# Patient Record
Sex: Female | Born: 1950
Health system: Southern US, Community
[De-identification: ages and names within clinical notes are randomized; demographics above are authoritative.]

## PROBLEM LIST (undated history)

## (undated) DIAGNOSIS — F411 Generalized anxiety disorder: Secondary | ICD-10-CM

## (undated) DIAGNOSIS — K859 Acute pancreatitis without necrosis or infection, unspecified: Secondary | ICD-10-CM

## (undated) DIAGNOSIS — H269 Unspecified cataract: Secondary | ICD-10-CM

## (undated) DIAGNOSIS — K219 Gastro-esophageal reflux disease without esophagitis: Secondary | ICD-10-CM

## (undated) DIAGNOSIS — M1990 Unspecified osteoarthritis, unspecified site: Secondary | ICD-10-CM

## (undated) DIAGNOSIS — N189 Chronic kidney disease, unspecified: Secondary | ICD-10-CM

## (undated) DIAGNOSIS — E039 Hypothyroidism, unspecified: Secondary | ICD-10-CM

## (undated) HISTORY — PX: CHOLECYSTECTOMY: SHX55

## (undated) HISTORY — DX: Generalized anxiety disorder: F41.1

## (undated) HISTORY — PX: UPPER GASTROINTESTINAL ENDOSCOPY: SHX188

## (undated) HISTORY — DX: Unspecified cataract: H26.9

## (undated) HISTORY — DX: Hypothyroidism, unspecified: E03.9

## (undated) HISTORY — PX: WISDOM TOOTH EXTRACTION: SHX21

## (undated) HISTORY — PX: OTHER SURGICAL HISTORY: SHX169

## (undated) HISTORY — PX: GASTRIC BYPASS: SHX52

## (undated) HISTORY — DX: Acute pancreatitis without necrosis or infection, unspecified: K85.90

## (undated) HISTORY — DX: Gastro-esophageal reflux disease without esophagitis: K21.9

## (undated) HISTORY — DX: Unspecified osteoarthritis, unspecified site: M19.90

---

## 2000-02-06 ENCOUNTER — Emergency Department (HOSPITAL_COMMUNITY): Admission: EM | Admit: 2000-02-06 | Discharge: 2000-02-06 | Payer: Self-pay | Admitting: Emergency Medicine

## 2000-02-06 ENCOUNTER — Encounter: Payer: Self-pay | Admitting: Emergency Medicine

## 2002-06-27 ENCOUNTER — Ambulatory Visit: Admission: RE | Admit: 2002-06-27 | Discharge: 2002-06-27 | Payer: Self-pay | Admitting: Family Medicine

## 2002-06-30 ENCOUNTER — Ambulatory Visit (HOSPITAL_COMMUNITY): Admission: RE | Admit: 2002-06-30 | Discharge: 2002-06-30 | Payer: Self-pay | Admitting: Family Medicine

## 2002-06-30 ENCOUNTER — Encounter: Payer: Self-pay | Admitting: Family Medicine

## 2003-03-18 ENCOUNTER — Ambulatory Visit (HOSPITAL_BASED_OUTPATIENT_CLINIC_OR_DEPARTMENT_OTHER): Admission: RE | Admit: 2003-03-18 | Discharge: 2003-03-18 | Payer: Self-pay | Admitting: Obstetrics and Gynecology

## 2003-03-18 ENCOUNTER — Encounter (INDEPENDENT_AMBULATORY_CARE_PROVIDER_SITE_OTHER): Payer: Self-pay | Admitting: Specialist

## 2003-03-18 ENCOUNTER — Ambulatory Visit (HOSPITAL_COMMUNITY): Admission: RE | Admit: 2003-03-18 | Discharge: 2003-03-18 | Payer: Self-pay | Admitting: Obstetrics and Gynecology

## 2004-02-22 ENCOUNTER — Ambulatory Visit: Payer: Self-pay | Admitting: Family Medicine

## 2004-02-29 ENCOUNTER — Ambulatory Visit: Payer: Self-pay | Admitting: Family Medicine

## 2004-03-10 ENCOUNTER — Ambulatory Visit: Payer: Self-pay | Admitting: Internal Medicine

## 2004-03-17 ENCOUNTER — Ambulatory Visit: Payer: Self-pay | Admitting: Internal Medicine

## 2004-04-18 ENCOUNTER — Ambulatory Visit: Payer: Self-pay | Admitting: Internal Medicine

## 2004-05-04 ENCOUNTER — Ambulatory Visit: Payer: Self-pay | Admitting: Family Medicine

## 2004-05-05 ENCOUNTER — Ambulatory Visit: Payer: Self-pay | Admitting: Internal Medicine

## 2004-05-10 ENCOUNTER — Ambulatory Visit: Payer: Self-pay | Admitting: Family Medicine

## 2004-06-06 ENCOUNTER — Ambulatory Visit: Payer: Self-pay | Admitting: Family Medicine

## 2004-10-12 ENCOUNTER — Ambulatory Visit: Payer: Self-pay | Admitting: Family Medicine

## 2004-10-13 ENCOUNTER — Ambulatory Visit: Payer: Self-pay | Admitting: Family Medicine

## 2004-10-17 ENCOUNTER — Ambulatory Visit: Payer: Self-pay | Admitting: Internal Medicine

## 2004-10-18 ENCOUNTER — Ambulatory Visit: Payer: Self-pay | Admitting: Internal Medicine

## 2004-10-18 ENCOUNTER — Encounter (INDEPENDENT_AMBULATORY_CARE_PROVIDER_SITE_OTHER): Payer: Self-pay | Admitting: Specialist

## 2004-10-26 ENCOUNTER — Ambulatory Visit (HOSPITAL_COMMUNITY): Admission: RE | Admit: 2004-10-26 | Discharge: 2004-10-26 | Payer: Self-pay | Admitting: Plastic Surgery

## 2004-10-26 ENCOUNTER — Encounter (INDEPENDENT_AMBULATORY_CARE_PROVIDER_SITE_OTHER): Payer: Self-pay | Admitting: *Deleted

## 2004-10-26 ENCOUNTER — Ambulatory Visit (HOSPITAL_BASED_OUTPATIENT_CLINIC_OR_DEPARTMENT_OTHER): Admission: RE | Admit: 2004-10-26 | Discharge: 2004-10-26 | Payer: Self-pay | Admitting: Plastic Surgery

## 2006-04-09 ENCOUNTER — Ambulatory Visit: Payer: Self-pay | Admitting: Family Medicine

## 2006-04-09 LAB — CONVERTED CEMR LAB
ALT: 16 units/L (ref 0–40)
AST: 21 units/L (ref 0–37)
Albumin: 3.9 g/dL (ref 3.5–5.2)
Alkaline Phosphatase: 49 units/L (ref 39–117)
BUN: 13 mg/dL (ref 6–23)
Basophils Absolute: 0 10*3/uL (ref 0.0–0.1)
Basophils Relative: 0.4 % (ref 0.0–1.0)
CO2: 28 meq/L (ref 19–32)
Calcium: 9.1 mg/dL (ref 8.4–10.5)
Chloride: 110 meq/L (ref 96–112)
Cholesterol: 109 mg/dL (ref 0–200)
Creatinine, Ser: 0.9 mg/dL (ref 0.4–1.2)
Eosinophils Absolute: 0.1 10*3/uL (ref 0.0–0.6)
Eosinophils Relative: 2.1 % (ref 0.0–5.0)
GFR calc Af Amer: 84 mL/min
GFR calc non Af Amer: 69 mL/min
Glucose, Bld: 91 mg/dL (ref 70–99)
HCT: 31.7 % — ABNORMAL LOW (ref 36.0–46.0)
HDL: 48.7 mg/dL (ref >39.0)
Hemoglobin: 10.4 g/dL — ABNORMAL LOW (ref 12.0–15.0)
LDL Cholesterol: 51 mg/dL (ref 0–99)
Lymphocytes Relative: 38.1 % (ref 12.0–46.0)
MCHC: 32.8 g/dL (ref 30.0–36.0)
MCV: 71.6 fL — ABNORMAL LOW (ref 78.0–100.0)
Monocytes Absolute: 0.5 10*3/uL (ref 0.2–0.7)
Monocytes Relative: 9.6 % (ref 3.0–11.0)
Neutro Abs: 2.3 10*3/uL (ref 1.4–7.7)
Neutrophils Relative %: 49.8 % (ref 43.0–77.0)
Platelets: 282 10*3/uL (ref 150–400)
Potassium: 4.1 meq/L (ref 3.5–5.1)
RBC: 4.43 M/uL (ref 3.87–5.11)
RDW: 17.9 % — ABNORMAL HIGH (ref 11.5–14.6)
Sodium: 145 meq/L (ref 135–145)
TSH: 5.92 microintl units/mL — ABNORMAL HIGH (ref 0.35–5.50)
Total Bilirubin: 0.3 mg/dL (ref 0.3–1.2)
Total CHOL/HDL Ratio: 2.2
Total Protein: 6.8 g/dL (ref 6.0–8.3)
Triglycerides: 49 mg/dL (ref 0–149)
VLDL: 10 mg/dL (ref 0–40)
WBC: 4.7 10*3/uL (ref 4.5–10.5)

## 2006-04-19 ENCOUNTER — Ambulatory Visit: Payer: Self-pay | Admitting: Family Medicine

## 2006-04-24 ENCOUNTER — Ambulatory Visit: Payer: Self-pay | Admitting: Family Medicine

## 2006-11-10 ENCOUNTER — Ambulatory Visit: Payer: Self-pay | Admitting: Family Medicine

## 2006-12-17 ENCOUNTER — Ambulatory Visit: Payer: Self-pay | Admitting: Family Medicine

## 2007-02-08 DIAGNOSIS — M2550 Pain in unspecified joint: Secondary | ICD-10-CM | POA: Insufficient documentation

## 2007-03-08 ENCOUNTER — Ambulatory Visit: Payer: Self-pay | Admitting: Internal Medicine

## 2007-03-08 DIAGNOSIS — E039 Hypothyroidism, unspecified: Secondary | ICD-10-CM | POA: Insufficient documentation

## 2007-03-08 DIAGNOSIS — R5383 Other fatigue: Secondary | ICD-10-CM

## 2007-03-08 DIAGNOSIS — R5381 Other malaise: Secondary | ICD-10-CM | POA: Insufficient documentation

## 2007-03-08 HISTORY — DX: Hypothyroidism, unspecified: E03.9

## 2007-03-11 ENCOUNTER — Ambulatory Visit: Payer: Self-pay | Admitting: Family Medicine

## 2007-03-11 ENCOUNTER — Telehealth: Payer: Self-pay | Admitting: Family Medicine

## 2007-03-11 LAB — CONVERTED CEMR LAB
Anti Nuclear Antibody(ANA): NEGATIVE
Folate: >20 ng/mL
Iron: 112 ug/dL (ref 42–145)
Rheumatoid fact SerPl-aCnc: <20 intl units/mL — ABNORMAL LOW (ref 0.0–20.0)
Sed Rate: 15 mm/hr (ref 0–25)
Transferrin: 348.7 mg/dL (ref >=212.0)
Vitamin B-12: 585 pg/mL (ref 211–911)

## 2007-03-12 LAB — CONVERTED CEMR LAB
ALT: 22 units/L (ref 0–35)
AST: 23 units/L (ref 0–37)
Albumin: 4.2 g/dL (ref 3.5–5.2)
Alkaline Phosphatase: 57 units/L (ref 39–117)
BUN: 15 mg/dL (ref 6–23)
Basophils Absolute: 0 10*3/uL (ref 0.0–0.1)
Basophils Relative: 0 % (ref 0–1)
Bilirubin, Direct: 0.1 mg/dL (ref 0.0–0.3)
CO2: 25 meq/L (ref 19–32)
Calcium: 9 mg/dL (ref 8.4–10.5)
Chloride: 109 meq/L (ref 96–112)
Creatinine, Ser: 0.72 mg/dL (ref 0.40–1.20)
Eosinophils Absolute: 0.1 10*3/uL (ref 0.0–0.7)
Eosinophils Relative: 2 % (ref 0–5)
Glucose, Bld: 67 mg/dL — ABNORMAL LOW (ref 70–99)
HCT: 33.4 % — ABNORMAL LOW (ref 36.0–46.0)
Hemoglobin: 10.4 g/dL — ABNORMAL LOW (ref 12.0–15.0)
Indirect Bilirubin: 0.2 mg/dL (ref 0.0–0.9)
Lymphocytes Relative: 35 % (ref 12–46)
Lymphs Abs: 2.4 10*3/uL (ref 0.7–4.0)
MCHC: 31.1 g/dL (ref 30.0–36.0)
MCV: 78 fL (ref 78.0–100.0)
Monocytes Absolute: 0.6 10*3/uL (ref 0.1–1.0)
Monocytes Relative: 9 % (ref 3–12)
Neutro Abs: 3.7 10*3/uL (ref 1.7–7.7)
Neutrophils Relative %: 54 % (ref 43–77)
Platelets: 277 10*3/uL (ref 150–400)
Potassium: 4.3 meq/L (ref 3.5–5.3)
RBC: 4.28 M/uL (ref 3.87–5.11)
RDW: 17.6 % — ABNORMAL HIGH (ref 11.5–15.5)
Sodium: 145 meq/L (ref 135–145)
TSH: 3.188 microintl units/mL (ref 0.350–5.50)
Total Bilirubin: 0.3 mg/dL (ref 0.3–1.2)
Total Protein: 6.6 g/dL (ref 6.0–8.3)
WBC: 6.8 10*3/uL (ref 4.0–10.5)

## 2007-03-19 ENCOUNTER — Ambulatory Visit: Payer: Self-pay | Admitting: Family Medicine

## 2007-03-20 ENCOUNTER — Telehealth: Payer: Self-pay | Admitting: Family Medicine

## 2007-03-25 ENCOUNTER — Encounter: Payer: Self-pay | Admitting: Family Medicine

## 2007-04-30 ENCOUNTER — Telehealth: Payer: Self-pay | Admitting: Family Medicine

## 2007-09-20 ENCOUNTER — Ambulatory Visit: Payer: Self-pay | Admitting: Family Medicine

## 2007-09-20 DIAGNOSIS — F411 Generalized anxiety disorder: Secondary | ICD-10-CM | POA: Insufficient documentation

## 2007-09-20 HISTORY — DX: Generalized anxiety disorder: F41.1

## 2007-12-02 ENCOUNTER — Telehealth: Payer: Self-pay | Admitting: Family Medicine

## 2008-01-06 ENCOUNTER — Ambulatory Visit: Payer: Self-pay | Admitting: Family Medicine

## 2008-06-27 ENCOUNTER — Telehealth: Payer: Self-pay | Admitting: Family Medicine

## 2010-04-14 NOTE — Miscellaneous (Signed)
Summary: meds  Clinical Lists Changes  Medications: Added new medication of TANDEM F 162-115.2-1 MG  CAPS (FERROUS FUM-IRON POLYSACCH-FA) Take 1 capsule by mouth once a day - Signed Changed medication from COBAL-1000 1000 MCG/ML INJ SOLN (CYANOCOBALAMIN) to COBAL-1000 1000 MCG/ML INJ SOLN (CYANOCOBALAMIN) 1 inj. a month - Signed Rx of COBAL-1000 1000 MCG/ML INJ SOLN (CYANOCOBALAMIN) 1 inj. a month;  #1 bottle x 4;  Signed;  Entered by: Arcola Jansky, RN;  Authorized by: Roderick Pee MD;  Method used: Print then Give to Patient Rx of TANDEM F 162-115.2-1 MG  CAPS (FERROUS FUM-IRON POLYSACCH-FA) Take 1 capsule by mouth once a day;  #90 x 4;  Signed;  Entered by: Arcola Jansky, RN;  Authorized by: Roderick Pee MD;  Method used: Print then Give to Patient Rx of SYNTHROID 50 MCG TABS (LEVOTHYROXINE SODIUM) 1 by mouth qd;  #90 x 4;  Signed;  Entered by: Arcola Jansky, RN;  Authorized by: Roderick Pee MD;  Method used: Print then Give to Patient Rx of TANDEM F 162-115.2-1 MG  CAPS (FERROUS FUM-IRON POLYSACCH-FA) Take 1 capsule by mouth once a day;  #30 x 0;  Signed;  Entered by: Arcola Jansky, RN;  Authorized by: Roderick Pee MD;  Method used: Print then Give to Patient    Prescriptions: TANDEM F 162-115.2-1 MG  CAPS (FERROUS FUM-IRON POLYSACCH-FA) Take 1 capsule by mouth once a day  #30 x 0   Entered by:   Arcola Jansky, RN   Authorized by:   Roderick Pee MD   Signed by:   Arcola Jansky, RN on 03/25/2007   Method used:   Print then Give to Patient   RxID:   [id number] SYNTHROID 50 MCG TABS (LEVOTHYROXINE SODIUM) 1 by mouth qd  #90 x 4   Entered by:   Arcola Jansky, RN   Authorized by:   Roderick Pee MD   Signed by:   Arcola Jansky, RN on 03/25/2007   Method used:   Print then Give to Patient   RxID:   [id number] TANDEM F 162-115.2-1 MG  CAPS (FERROUS FUM-IRON POLYSACCH-FA) Take 1 capsule by mouth once a day   #90 x 4   Entered by:   Arcola Jansky, RN   Authorized by:   Roderick Pee MD   Signed by:   Arcola Jansky, RN on 03/25/2007   Method used:   Print then Give to Patient   RxID:   [id number] COBAL-1000 1000 MCG/ML INJ SOLN (CYANOCOBALAMIN) 1 inj. a month  #1 bottle x 4   Entered by:   Arcola Jansky, RN   Authorized by:   Roderick Pee MD   Signed by:   Arcola Jansky, RN on 03/25/2007   Method used:   Print then Give to Patient   RxID:   [id number]

## 2010-04-14 NOTE — Assessment & Plan Note (Signed)
Summary: ACUTE/VN    History of Present Illness: Jaime Fuller is a 60 year old female, who comes in today for evaluation of symmetrical joint pain since November 28.  She remembers a distinctly because the Thursday prior was Thanksgiving.  She felt fine.  The next day, the 20 H. began having aching in her right wrist.  Over 3 or 4 days that got worse in the pelvis on all her joints hurt.  She has no redness or swelling, but the dentist saw her.  She's otherwise been well.  Review of systems negative except she got a tick on her neck in August of 08.  She said it was small and should describes it as a deer tick.  She said it left him not to two months to go away.  She said no atypical skin rashes otherwise been well.  She saw Dr. Cato Mulligan for this problem on December 29.  At that time.  Hemoglobin was 10, otherwise, laboratory data was normal.  Acute Visit History:      She denies abdominal pain, chest pain, constipation, cough, diarrhea, earache, eye symptoms, fever, genitourinary symptoms, headache, musculoskeletal symptoms, nasal discharge, nausea, rash, sinus problems, sore throat, and vomiting.         Current Allergies: No known allergies   Past Medical History:    Reviewed history from 03/08/2007 and no changes required:       Hypothyroidism    Risk Factors:  Tobacco use:  never   Review of Systems      See HPI   Physical Exam  General:     Well-developed,well-nourished,in no acute distress; alert,appropriate and cooperative throughout examination Head:     Normocephalic and atraumatic without obvious abnormalities. No apparent alopecia or balding. Eyes:     No corneal or conjunctival inflammation noted. EOMI. Perrla. Funduscopic exam benign, without hemorrhages, exudates or papilledema. Vision grossly normal. Ears:     External ear exam shows no significant lesions or deformities.  Otoscopic examination reveals clear canals, tympanic membranes are intact bilaterally without  bulging, retraction, inflammation or discharge. Hearing is grossly normal bilaterally. Nose:     External nasal examination shows no deformity or inflammation. Nasal mucosa are pink and moist without lesions or exudates. Mouth:     Oral mucosa and oropharynx without lesions or exudates.  Teeth in good repair. Msk:     the right wrist is tender to palpation is not red or hot.  Other joints are tender, but not red or hot Pulses:     R and L carotid,radial,femoral,dorsalis pedis and posterior tibial pulses are full and equal bilaterally Extremities:     No clubbing, cyanosis, edema, or deformity noted with normal full range of motion of all joints.      Impression & Recommendations:  Problem # 1:  PAIN IN JOINT, SITE UNSPECIFIED (ICD-719.40) Assessment: New  Orders: Venipuncture (19147) TLB-Rheumatoid Factor (RA) (82956-OZ) TLB-Sedimentation Rate (ESR) (85651-ESR) T-Antinuclear Antib (ANA) [phone]) T-Lyme Disease (62952-84132) TLB-B12 + Folate Pnl (82746_82607-B12/FOL) TLB-Iron, (Fe) Total (83540-FE) TLB-Transferrin (84466-TRNSF)   Complete Medication List: 1)  Synthroid 50 Mcg Tabs (Levothyroxine sodium) .Marland Kitchen.. 1 by mouth qd 2)  Ferrous Fumarate 325 Mg Tabs (Ferrous fumarate) .Marland Kitchen.. 1 by mouth once daily 3)  Prednisone 20 Mg Tabs (Prednisone) .... Uad   Patient Instructions: 1)  begin prednisone 20-mg tablets two a day for 3 days, one a day for 3 days, a half a tablet a day for 3 days, then half a tablet every other day for two  week taper.  We will get special laboratory data today, he return next Tuesday for follow-up    Prescriptions: PREDNISONE 20 MG  TABS (PREDNISONE) UAD  #50 x 1   Entered and Authorized by:   Roderick Pee MD   Signed by:   Roderick Pee MD on 03/11/2007   Method used:   Print then Give to Patient   RxID:   [id number]  ]

## 2010-04-14 NOTE — Assessment & Plan Note (Signed)
Summary: Orders Update  Nurse Visit   Impression & Recommendations: lot [id number], EXP 30 jun 09, sanofi pasteur left deltoid IM, 0.5 cc.        Influenza Vaccine    Vaccine Type: Fluvax 3+    Given by: Arcola Jansky, RN  Flu Vaccine Consent Questions    Do you have a history of severe allergic reactions to this vaccine? no    Any prior history of allergic reactions to egg and/or gelatin? no    Do you have a sensitivity to the preservative Thimersol? no    Do you have a past history of Guillan-Barre Syndrome? no    Do you currently have an acute febrile illness? no    Have you ever had a severe reaction to latex? no    Vaccine information given and explained to patient? yes    Are you currently pregnant? no   Orders Added: 1)  Flu Vaccine 37yrs + [90658] 2)  Admin 1st Vaccine [email or web address].Peer    ]

## 2010-04-14 NOTE — Progress Notes (Signed)
Summary: clonzepam refill  Phone Note From Pharmacy   Caller: [facility] [address] Details for Reason: refill Summary of Call: pt would like a refill of clonazepam is this okay to fill? Initial call taken by: Kern Reap CMA,  December 02, 2007 9:53 AM  Follow-up for Phone Call        ok #30 one at bedtime as needed refills x 5 Follow-up by: Roderick Pee MD,  December 02, 2007 10:28 AM  Additional Follow-up for Phone Call Additional follow up Details #1::        rx called in Additional Follow-up by: Kern Reap CMA,  December 02, 2007 4:03 PM

## 2010-04-14 NOTE — Assessment & Plan Note (Signed)
Summary: flu shot - rv  Nurse Visit  Flu Vaccine Consent Questions     Do you have a history of severe allergic reactions to this vaccine? no    Any prior history of allergic reactions to egg and/or gelatin? no    Do you have a sensitivity to the preservative Thimersol? no    Do you have a past history of Guillan-Barre Syndrome? no    Do you currently have an acute febrile illness? no    Have you ever had a severe reaction to latex? no    Vaccine information given and explained to patient? yes    Are you currently pregnant? no    Lot Number:[id number]   Exp Date:09/09/2008   Site Given  Left Deltoid IM   Prior Medications: SYNTHROID 50 MCG TABS (LEVOTHYROXINE SODIUM) 1 by mouth qd VITAMIN B-12 CR 1000 MCG  TBCR (CYANOCOBALAMIN)  COBAL-1000 1000 MCG/ML INJ SOLN (CYANOCOBALAMIN) 1 inj. a month TANDEM F 162-115.2-1 MG  CAPS (FERROUS FUM-IRON POLYSACCH-FA) Take 1 capsule by mouth once a day KLONOPIN 0.5 MG  TABS (CLONAZEPAM) Take 1 tablet by mouth two times a day Current Allergies: No known allergies     Orders Added: 1)  Admin 1st Vaccine [90471] 2)  Flu Vaccine 91yrs + B[place].Dew    ]

## 2010-04-14 NOTE — Progress Notes (Signed)
Summary: ON-CALL PHONE NOTE  Phone Note Call from Patient Call back at [phone]   Summary of Call: UNABLE TO URINATE(PATIENT SAID ONLY A LITTLE COMES OUT BUT HER BLADDER FEELS FULL)-SECOND TIME THIS HAPPENED THIS WEEK: PATIENT IS CURRENTLY TAKING PRESCRIBED MED:  NITROSURANTOIN-MACRO (PER GYN). PATIENT SEEN GYN 3 WEEKS AGO FOR THIS SAME CONCERN. PATIENT THEN STATES THE MAIN REASON SHE CALLED IS SHE IS BLEEDING ALSO WHEN SHE TRIES TO URINATE-PATIENT NO LONGER WITH MENSTURAL. PATIENT INSTRUCTED TO BE SEEN AT URGENT CARE OR EMERGENCY ROOM. PATIENT MAY NEED TESTING WHICH WE DO NOT HAVE ACCESS TO AT [facility]. PATIENT AWARE I WILL FORWARD MESSAGE TO ON-CALL DR. AND IF ANY FUTHER INSTRUCTION TO BE GIVEN SHE WILL RECEIVE CALL BACK. Initial call taken by: Shonna Chock,  June 27, 2008 9:36 AM  Follow-up for Phone Call        Agree. Follow-up by: Shaune Leeks MD,  June 27, 2008 9:40 AM

## 2010-04-14 NOTE — Progress Notes (Signed)
Summary: blood work results-more pain  Phone Note Call from Patient Call back at [employer] [phone] Call back at cell [phone]   Caller: patient triage message Call For: todd Summary of Call: has more pain needs to know what the blood work showed Dr Cato Mulligan said he would fax the results to Dr Linnell Fulling and does not want blood work done there Initial call taken by: Roselle Locus,  March 11, 2007 8:58 AM

## 2010-04-14 NOTE — Progress Notes (Signed)
Summary: low vit D  Phone Note Call from Patient Call back at [phone] or c [phone]   Call For: todd Summary of Call: Dr. Orlan Leavens did Vit D test that was low & is faxing copy to you for you to address/treat.  [facility]  [facility]. Initial call taken by: Rudy Jew, RN,  April 30, 2007 1:42 PM  Follow-up for Phone Call        vitamin D level 27.  Patient takes vitamin D.  No change. Follow-up by: Roderick Pee MD,  May 13, 2007 10:35 AM

## 2010-04-14 NOTE — Assessment & Plan Note (Signed)
Summary: 1 week roa/jls   Vital Signs:  Patient Profile:   60 Years Old Female Weight:      177 pounds (80.45 kg) Temp:     98.4 degrees F (36.89 degrees C) oral Pulse rate:   66 / minute BP sitting:   108 / 55  (right arm)  Pt. in pain?   yes    Location:   ARM/PAIN    Intensity:   8    Type:       burning/STINGING/COLD  Vitals Entered By: Arcola Jansky, RN (March 19, 2007 1:52 PM)                  Chief Complaint:  "STILL HAVING ARM/WRIST PAIN".  History of Present Illness: pam is a 60 year old female, who comes back today for evaluation probably arthralgias.  She was initially seen by Dr. Hermelinda Medicus for evaluation.  Symmetrical joint pain.  His exam and basic laboratory data was negative.  She came back leak later and was not any better.  Re-examination showed no change in her physical examination.  She had joint pain, without any redness, swelling, etc.  Further diagnostic studies, including Lyme titers rheumatoid factor, sed rate, etc., were negative.  She was started on prednisone is now down to half a tablet a day and feels better, but not well.  Current Allergies (reviewed today): No known allergies   Past Medical History:    Reviewed history from 03/08/2007 and no changes required:       Hypothyroidism       gastric bypass surgery       cholecystectomy       childbirth x 2       abdominal plasty, pancreatitis, secondary to ERCP       wisdom teeth, extraction       carpal tunnel surgery left hand   Family History:    Reviewed history and no changes required:       father died at 69 MI, lung cancer.  Mother has a history of B12 deficiency, brother in good health.  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   Risk  Factors:  Tobacco use:  never Drug use:  no Alcohol use:  no Exercise:  yes   Review of Systems      See HPI   Physical Exam  General:     Well-developed,well-nourished,in no acute distress; alert,appropriate and cooperative throughout examination Head:     Normocephalic and atraumatic without obvious abnormalities. No apparent alopecia or balding. Eyes:     No corneal or conjunctival inflammation noted. EOMI. Perrla. Funduscopic exam benign, without hemorrhages, exudates or papilledema. Vision grossly normal. Ears:     External ear exam shows no significant lesions or deformities.  Otoscopic examination reveals clear canals, tympanic membranes are intact bilaterally without bulging, retraction, inflammation or discharge. Hearing is grossly normal bilaterally. Nose:     External nasal examination shows no deformity or inflammation. Nasal mucosa are pink and moist without lesions or exudates. Mouth:     Oral mucosa and oropharynx without lesions or exudates.  Teeth in good repair. Msk:  No deformity or scoliosis noted of thoracic or lumbar spine.  there is tenderness in the joints, especially the right wrist.  There is no redness or swelling. Pulses:     R and L carotid,radial,femoral,dorsalis pedis and posterior tibial pulses are full and equal bilaterally Extremities:     No clubbing, cyanosis, edema, or deformity noted with normal full range of motion of all joints.   Neurologic:     No cranial nerve deficits noted. Station and gait are normal. Plantar reflexes are down-going bilaterally. DTRs are symmetrical throughout. Sensory, motor and coordinative functions appear intact.    Impression & Recommendations:  Problem # 1:  PAIN IN JOINT, SITE UNSPECIFIED (ICD-719.40) Assessment: Improved  Complete Medication List: 1)  Synthroid 50 Mcg Tabs (Levothyroxine sodium) .Marland Kitchen.. 1 by mouth qd 2)  Ferrous Fumarate 325 Mg Tabs (Ferrous fumarate) .Marland Kitchen.. 1 by mouth once daily 3)   Prednisone 20 Mg Tabs (Prednisone) .... Uad   Patient Instructions: 1)  increase the prednisone to two a day, anterior well, then begin a slow tapering process.  Return p.r.n.    ]

## 2010-04-14 NOTE — Assessment & Plan Note (Signed)
Summary: extreme anxiety over daughter's arrest/dm   Vital Signs:  Patient Profile:   60 Years Old Female Weight:      177 pounds Temp:     98.1 degrees F oral BP sitting:   130 / 90  (left arm) Cuff size:   regular  Vitals Entered By: Kern Reap CMA (September 20, 2007 8:48 AM)                 Chief Complaint:  anxiety and stress.  History of Present Illness: Jaime Fuller is a 60 year old female, who comes in today accompanied by her husband for evaluation of anxiety and crying.  Her daughter currently is supposed to not be in contact with a person that she had been involved with a theft.  However, she was in was rearrested by the police and is currently in jail.  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    Review of Systems      See HPI   Physical Exam  General:     Well-developed,well-nourished,in no acute distress; alert,appropriate and cooperative throughout examination Psych:     tearful and slightly anxious.      Impression & Recommendations:  Problem # 1:  ANXIETY STATE, UNSPECIFIED (ICD-300.00) Assessment: New  Her updated medication list for this problem includes:    Klonopin 0.5 Mg Tabs (Clonazepam) .Marland Kitchen... Take 1 tablet by mouth two times a day   Complete Medication List: 1)  Synthroid 50 Mcg Tabs (Levothyroxine sodium) .Marland Kitchen.. 1 by mouth qd 2)  Vitamin B-12 Cr 1000 Mcg Tbcr (Cyanocobalamin) 3)  Cobal-1000 1000 Mcg/ml Inj Soln (Cyanocobalamin) .Marland Kitchen.. 1 inj. a month 4)  Tandem F 162-115.2-1 Mg Caps (Ferrous fum-iron polysacch-fa) .... Take 1 capsule by mouth once a day 5)  Klonopin 0.5 Mg Tabs (Clonazepam) .... Take 1  tablet by mouth two times a day   Patient Instructions: 1)  you may take Klonopin .5 mg twice a day as needed.  Return with her daughter on Monday morning at 8 a.m. for further evaluation if over the weekend, you feel she is severely depressed and is in need of help.  Prior to Monday, you may go and take her to [facility]   Prescriptions: KLONOPIN 0.5 MG  TABS (CLONAZEPAM) Take 1 tablet by mouth two times a day  #30 x 0   Entered and Authorized by:   Roderick Pee MD   Signed by:   Roderick Pee MD on 09/20/2007   Method used:   Print then Give to Patient   RxID:   [id number]  ]

## 2010-04-14 NOTE — Assessment & Plan Note (Signed)
Summary: fatigue/pt was bitten by tick over 3 month ago/njr   Vital Signs:  Patient Profile:   60 Years Old Female Weight:      181 pounds Temp:     98.1 degrees F oral Pulse rate:   76 / minute Pulse rhythm:   regular BP sitting:   132 / 76  (left arm) Cuff size:   regular  Vitals Entered By: Alfred Levins, CMA (March 08, 2007 3:48 PM)                 Chief Complaint:  fatigue and joints hurt.  History of Present Illness: Bitten by ticke 8/08---no local sxs other than a small knot on the back of head. She is now complianing of joint pains---"all of the time"--for one month. Right wrist is worse than any other joint. She denies any rash or any other complaints. Joint involved---wrist, fingers, knees, ankles.   She has a hx of gastric bypass 2004 takes iron and B12 injection.   Current Allergies: No known allergies   Past Medical History:    Hypothyroidism  Past Surgical History:    Gastric bypass    Abdomplasty     Review of Systems       no other complaints in a complete ROS    Physical Exam  General:     Well-developed,well-nourished,in no acute distress; alert,appropriate and cooperative throughout examination Head:     normocephalic and atraumatic.   Eyes:     pupils equal and pupils round.   Ears:     R ear normal and L ear normal.   Neck:     No deformities, masses, or tenderness noted. Lungs:     Normal respiratory effort, chest expands symmetrically. Lungs are clear to auscultation, no crackles or wheezes. Heart:     Normal rate and regular rhythm. S1 and S2 normal without gallop, murmur, click, rub or other extra sounds. Abdomen:     Bowel sounds positive,abdomen soft and non-tender without masses, organomegaly or hernias noted. Msk:     No deformity or scoliosis noted of thoracic or lumbar spine.   Extremities:     No clubbing, cyanosis, edema, or deformity noted with normal full range of motion of all joints---no effusions Skin:  Intact without suspicious lesions or rashes Psych:     Cognition and judgment appear intact. Alert and cooperative with normal attention span and concentration. No apparent delusions, illusions, hallucinations    Impression & Recommendations:  Problem # 1:  FATIGUE (ICD-780.79) also some associated joint pains needs further evaluation.  she has long standing anemia.  Orders: Venipuncture (84132) TLB-BMP (Basic Metabolic Panel-BMET) (80048-METABOL) TLB-Hepatic/Liver Function Pnl (80076-HEPATIC) TLB-CBC Platelet - w/Differential (85025-CBCD) Sedimentation Rate, non-automated (44010)   Problem # 2:  HYPOTHYROIDISM (ICD-244.9) no sxs-needs re check Her updated medication list for this problem includes:    Synthroid 50 Mcg Tabs (Levothyroxine sodium) .Marland Kitchen... 1 by mouth qd  Labs Reviewed: TSH: 5.92 (04/09/2006)    Chol: 109 (04/09/2006)   HDL: 48.7 (04/09/2006)   LDL: 51 (04/09/2006)   TG: 49 (04/09/2006)  Orders: TLB-TSH (Thyroid Stimulating Hormone) (84443-TSH)   Complete Medication List: 1)  Synthroid 50 Mcg Tabs (Levothyroxine sodium) .Marland Kitchen.. 1 by mouth qd 2)  Ferrous Fumarate 325 Mg Tabs (Ferrous fumarate) .Marland Kitchen.. 1 by mouth once daily     ]

## 2010-04-14 NOTE — Progress Notes (Signed)
Summary: saturday clinic  saturday clinic   Imported By: Kassie Mends 12/06/2006 13:06:58  _____________________________________________________________________  External Attachment:    Type:   Image     Comment:   saturday clinic

## 2010-04-14 NOTE — Assessment & Plan Note (Signed)
Summary: FLU SHOTS//[facility]  Nurse Visit          ]

## 2010-04-14 NOTE — Progress Notes (Signed)
Summary: written rx's  Call back at Home Phone [phone]   Caller: patient triage message Call For: Tawanna Cooler Summary of Call: Dr Lolita Cram gave her some samples and she would like an rx for a 90 day supply of  tanden F and synthroid Vitamin B12 give her a call and advise when she can pick  up the rx's.    Initial call taken by: Roselle Locus,  March 20, 2007 4:10 PM    New/Updated Medications: VITAMIN B-12 CR 1000 MCG  TBCR (CYANOCOBALAMIN)  COBAL-1000 1000 MCG/ML INJ SOLN (CYANOCOBALAMIN)

## 2010-06-06 ENCOUNTER — Telehealth: Payer: Self-pay | Admitting: Family Medicine

## 2010-06-06 MED ORDER — LEVOTHYROXINE SODIUM 50 MCG PO TABS: 50.0000 ug | ORAL_TABLET | Freq: Every day | ORAL | Status: DC

## 2010-06-06 NOTE — Telephone Encounter (Signed)
Okay for labs and office visit.  TSH, T3, T4

## 2010-06-06 NOTE — Telephone Encounter (Signed)
Pt called and said that she is out of Synthroid .05mg . Pt has been off of med for 2 weeks and is very achey. Pt req this to be [facility] to [facility] [address]  Pt also wanted to sch a med check ov, but would like to get lab work done prior to visit to test thyroid. Pls advise.

## 2010-06-09 NOTE — Telephone Encounter (Signed)
Lft vm for pt to cb to sch ov and labs prior, as noted. Waiting on call back.

## 2010-06-22 NOTE — Telephone Encounter (Signed)
Lft another vm for pt to sch ov and labs prior. Waiting on call back.

## 2010-06-30 ENCOUNTER — Other Ambulatory Visit (INDEPENDENT_AMBULATORY_CARE_PROVIDER_SITE_OTHER): Payer: BC Managed Care – PPO | Admitting: Family Medicine

## 2010-06-30 DIAGNOSIS — E039 Hypothyroidism, unspecified: Secondary | ICD-10-CM

## 2010-06-30 LAB — T4, FREE: Free T4: 0.87 ng/dL (ref 0.60–1.60)

## 2010-06-30 LAB — TSH: TSH: 2.44 u[IU]/mL (ref 0.35–5.50)

## 2010-06-30 LAB — T3, FREE: T3, Free: 2.9 pg/mL (ref 2.3–4.2)

## 2010-07-06 ENCOUNTER — Encounter: Payer: Self-pay | Admitting: Family Medicine

## 2010-07-07 ENCOUNTER — Encounter: Payer: Self-pay | Admitting: Family Medicine

## 2010-07-07 ENCOUNTER — Ambulatory Visit (INDEPENDENT_AMBULATORY_CARE_PROVIDER_SITE_OTHER): Payer: BC Managed Care – PPO | Admitting: Family Medicine

## 2010-07-07 DIAGNOSIS — R5383 Other fatigue: Secondary | ICD-10-CM

## 2010-07-07 DIAGNOSIS — E039 Hypothyroidism, unspecified: Secondary | ICD-10-CM

## 2010-07-07 DIAGNOSIS — Z23 Encounter for immunization: Secondary | ICD-10-CM

## 2010-07-07 DIAGNOSIS — M2550 Pain in unspecified joint: Secondary | ICD-10-CM

## 2010-07-07 DIAGNOSIS — D510 Vitamin B12 deficiency anemia due to intrinsic factor deficiency: Secondary | ICD-10-CM

## 2010-07-07 DIAGNOSIS — M549 Dorsalgia, unspecified: Secondary | ICD-10-CM

## 2010-07-07 DIAGNOSIS — G8929 Other chronic pain: Secondary | ICD-10-CM

## 2010-07-07 DIAGNOSIS — R5381 Other malaise: Secondary | ICD-10-CM

## 2010-07-07 LAB — CBC WITH DIFFERENTIAL/PLATELET
Basophils Absolute: 0 10*3/uL (ref 0.0–0.1)
Basophils Relative: 0.5 % (ref 0.0–3.0)
Eosinophils Absolute: 0.1 10*3/uL (ref 0.0–0.7)
Eosinophils Relative: 1.3 % (ref 0.0–5.0)
HCT: 36.6 % (ref 36.0–46.0)
Hemoglobin: 12.4 g/dL (ref 12.0–15.0)
Lymphocytes Relative: 33.8 % (ref 12.0–46.0)
Lymphs Abs: 2.2 10*3/uL (ref 0.7–4.0)
MCHC: 33.9 g/dL (ref 30.0–36.0)
MCV: 86.7 fl (ref 78.0–100.0)
Monocytes Absolute: 0.5 10*3/uL (ref 0.1–1.0)
Monocytes Relative: 7.9 % (ref 3.0–12.0)
Neutro Abs: 3.6 10*3/uL (ref 1.4–7.7)
Neutrophils Relative %: 56.5 % (ref 43.0–77.0)
Platelets: 243 10*3/uL (ref 150.0–400.0)
RBC: 4.22 Mil/uL (ref 3.87–5.11)
RDW: 13.8 % (ref 11.5–14.6)
WBC: 6.4 10*3/uL (ref 4.5–10.5)

## 2010-07-07 LAB — BASIC METABOLIC PANEL
BUN: 17 mg/dL (ref 6–23)
CO2: 30 mEq/L (ref 19–32)
Calcium: 9.1 mg/dL (ref 8.4–10.5)
Chloride: 103 mEq/L (ref 96–112)
Creatinine, Ser: 0.8 mg/dL (ref 0.4–1.2)
GFR: 84.17 mL/min (ref >60.00)
Glucose, Bld: 75 mg/dL (ref 70–99)
Potassium: 4.2 mEq/L (ref 3.5–5.1)
Sodium: 140 mEq/L (ref 135–145)

## 2010-07-07 LAB — POCT URINALYSIS DIPSTICK
Bilirubin, UA: NEGATIVE
Blood, UA: NEGATIVE
Glucose, UA: NEGATIVE
Ketones, UA: NEGATIVE
Leukocytes, UA: NEGATIVE
Nitrite, UA: NEGATIVE
Protein, UA: NEGATIVE
Spec Grav, UA: 1.015
Urobilinogen, UA: 0.2
pH, UA: 7

## 2010-07-07 LAB — LIPID PANEL
Cholesterol: 146 mg/dL (ref 0–200)
HDL: 59.9 mg/dL (ref >39.00)
LDL Cholesterol: 74 mg/dL (ref 0–99)
Total CHOL/HDL Ratio: 2
Triglycerides: 60 mg/dL (ref 0.0–149.0)
VLDL: 12 mg/dL (ref 0.0–40.0)

## 2010-07-07 LAB — VITAMIN B12: Vitamin B-12: 255 pg/mL (ref 211–911)

## 2010-07-07 LAB — HEPATIC FUNCTION PANEL
ALT: 19 U/L (ref 0–35)
AST: 23 U/L (ref 0–37)
Albumin: 3.7 g/dL (ref 3.5–5.2)
Alkaline Phosphatase: 54 U/L (ref 39–117)
Bilirubin, Direct: 0.1 mg/dL (ref 0.0–0.3)
Total Bilirubin: 0.5 mg/dL (ref 0.3–1.2)
Total Protein: 6.3 g/dL (ref 6.0–8.3)

## 2010-07-07 MED ORDER — CYANOCOBALAMIN 1000 MCG/ML IJ SOLN: 1000.0000 ug | INTRAMUSCULAR | Status: DC

## 2010-07-07 MED ORDER — LEVOTHYROXINE SODIUM 50 MCG PO TABS: 50.0000 ug | ORAL_TABLET | Freq: Every day | ORAL | Status: DC

## 2010-07-07 MED ORDER — CLONAZEPAM 0.5 MG PO TABS: 0.5000 mg | ORAL_TABLET | Freq: Two times a day (BID) | ORAL | Status: DC

## 2010-07-07 NOTE — Patient Instructions (Signed)
Begin Motrin 400 mg twice daily with food.  Increase monthly and to the region maximum dose of 800 mg twice daily.  Walk daily.  Continue the B12 monthly.  Continue your thyroid daily.  We will put in a consult request for you to see Dr. Danielle Dess.  Follow-up in one year or sooner if any problems

## 2010-07-07 NOTE — Progress Notes (Signed)
  Subjective:    Patient ID: Jaime Fuller, female    DOB: [birth year], 60 y.o.   MRN: [id number]  HPIam is a 60 y/o Married female, nonsmoker, who comes in today for general physical examination today, because of some diffuse symmetrical in joint pain x 2 years, and a history of hypothyroidism, on Synthroid, history of B12 deficiency, on injectable B121 cc monthly and a new problem of degenerative disease with severe lumbar back pain.  She takes vitamin B12 1 cc monthly check B12 level and CBC.  She takes Synthroid 50 mcg daily TSH level normal.  For the last two, years she's had a history of joint pain.  She describes it as diffuse soreness worse in the morning, better when she feels warm, and in the summer, and indeed, she goes to the beach is much as possible.  No history of any redness.  Question family history of osteoarthritis.  A couple years ago, and she began having back pain and she went to an orthopedist who did a complete workup.  She was told she had an L5 ruptured disk.  It was treated with epidural steroid injections x 3 and her pain improved.  However, in the last couple years.  It is getting worse.  She denies any neurologic symptoms.  There has been recently saw Dr. Danielle Dess, and she would like to see Dr. Danielle Dess for a consult.  She gets routine eye care, dental care, BSE monthly, in a mammography, tapped by GYN in June 2011.  Reviewed the current recommendations every 3 years for a Pap    Review of Systems    Review of systems other than above, negative Objective:   Physical Exam  Constitutional: She appears well-developed and well-nourished.  HENT:  Head: Normocephalic and atraumatic.  Right Ear: External ear normal.  Left Ear: External ear normal.  Nose: Nose normal.  Mouth/Throat: Oropharynx is clear and moist.  Eyes: EOM are normal. Pupils are equal, round, and reactive to light.  Neck: Normal range of motion. Neck supple. No thyromegaly present.  Cardiovascular:  Normal rate, regular rhythm, normal heart sounds and intact distal pulses.  Exam reveals no gallop and no friction rub.   No murmur heard. Pulmonary/Chest: Effort normal and breath sounds normal.  Abdominal: Soft. Bowel sounds are normal. She exhibits no distension and no mass. There is no tenderness. There is no rebound.  Genitourinary:       Bilateral breast exam normal  Musculoskeletal: Normal range of motion.  Lymphadenopathy:    She has no cervical adenopathy.  Neurological: She is alert. She has normal reflexes. No cranial nerve deficit. She exhibits normal muscle tone. Coordination normal.  Skin: Skin is warm and dry.       Chronic sun damage  Psychiatric: She has a normal mood and affect. Her behavior is normal. Judgment and thought content normal.          Assessment & Plan:  Hypothyroidism continue Synthroid 50 mcg daily  Vitamin B12 deficiency.  Check CBC and B12 level.  Symmetrical joint pain x 2 years probable osteoarthritis.  Workup.  History of L5 ruptured disk, now with worsening symptoms.  Consult with Dr. Danielle Dess

## 2010-07-08 ENCOUNTER — Encounter: Payer: Self-pay | Admitting: *Deleted

## 2010-07-08 NOTE — Progress Notes (Signed)
Unable to reach patient by phone.  Letter and copy of labs sent to home address

## 2010-07-26 ENCOUNTER — Encounter: Payer: Self-pay | Admitting: Speech Pathology

## 2010-07-26 NOTE — Assessment & Plan Note (Signed)
[facility]                                 ON-CALL NOTE   NAME:Barge, SOPHONIE GOFORTH                       MRN:          [id number]  DATE:11/10/2006                            DOB:          1950/08/03    [phone]   PRIMARY CARE PHYSICIAN:  Dr. Tawanna Cooler   Patient calling because she has dysuria for 2 weeks, now it is worse,  she can not stand it.  Go to [facility], I will see her there.     Jeffrey A. Tawanna Cooler, MD  Electronically Signed    JAT/MedQ  DD: 11/10/2006  DT: 11/11/2006  Job #: [id number]

## 2010-07-29 NOTE — Op Note (Signed)
Jaime Fuller, Jaime Fuller                ACCOUNT NO.:  [id number]   MEDICAL RECORD NO.:  [id number]          PATIENT TYPE:  OUT   LOCATION:  [facility]                         FACILITY:  [facility]   PHYSICIAN:  Alfredia Ferguson, M.D.  DATE OF BIRTH:  May 21, 1950   DATE OF PROCEDURE:  10/26/2004  DATE OF DISCHARGE:  10/26/2004                                 OPERATIVE REPORT   PREOPERATIVE DIAGNOSES:  1.  Soft tissue fatty masses, lower abdomen.  2.  Excess skin and fat, lower abdomen.   POSTOPERATIVE DIAGNOSES:  1.  Soft tissue fatty masses, lower abdomen.  2.  Excess skin and fat, lower abdomen.   OPERATION PERFORMED:  Revision panniculectomy for removal of excess skin and  fat and two soft tissue masses, lower abdomen.   SURGEON:  Dr. Benna Dunks.   ANESTHESIA:  General endotracheal anesthesia.   INDICATION FOR SURGERY:  This is a 60 year old woman who has previously had  an abdominoplasty. She was noted by her local medical doctor to have two  soft tissue masses in the lower abdomen. These appear to be either fat  necrosis or lipomas. She wishes to undergo revision panniculectomy in order  to remove excess skin and fat and at the same time to remove the soft tissue  masses which were palpable. The patient understands the risk of this surgery  including bleeding, infection, hematoma, seroma, chronic drainage, unsightly  scarring, inability to give her perfect symmetry, and overall  dissatisfaction with the results. In spite of this, the patient wishes to  proceed with the surgery.   DESCRIPTION OF OPERATION:  Skin marks were placed before surgery outlining  the original scar from the patient's abdominoplasty performed six years  earlier. The patient was then taken to the OR where she was given general  endotracheal anesthesia. The abdomen was prepped with Betadine and draped in  sterile drapes. The previous abdominoplasty scar was now incised and  abdominal flap was elevated up to the level of  the umbilicus. The excess  skin was pulled in an inferior direction. The palpable masses came down  below the incision and the excess skin was marked. Excess skin was now  excised. The excised skin contained the soft tissue masses. The wound was  copiously irrigated with saline irrigation. Blake drains were placed in the  wound and brought through a separate stab incision. The wound was closed by  approximating the dermis with multiple interrupted 2-0 Vicryl sutures  alternating with 3-0 Monocryl sutures placed in the dermis. The skin was  united with a running 3-0 Monocryl subcuticular. The patient tolerated  procedure well with minimal blood loss. The abdomen was cleansed and dried.  Light dressing was applied  followed by abdominal binder. The patient was awakened, extubated, and  transported to the recovery room in satisfactory condition.   HISTORY OF PRESENT ILLNESS:  Thank you very much      W. Delia Chimes, M.D.  Electronically Signed     WBB/MEDQ  D:  01/05/2005  T:  01/05/2005  Job:  [id number]

## 2010-07-29 NOTE — Op Note (Signed)
NAMEMARYLN, Jaime Fuller                          ACCOUNT NO.:  [id number]   MEDICAL RECORD NO.:  [id number]                   PATIENT TYPE:  AMB   LOCATION:  [facility]                                 FACILITY:  [facility]   PHYSICIAN:  Katherine Roan, M.D.               DATE OF BIRTH:  30-Dec-1950   DATE OF PROCEDURE:  03/18/2003  DATE OF DISCHARGE:                                 OPERATIVE REPORT   PREOPERATIVE DIAGNOSIS:  Persistent menorrhagia.   POSTOPERATIVE DIAGNOSES:  1. Persistent menorrhagia.  2. Small uterine fibroids.   OPERATIONS PERFORMED:  1. Pelvic examination under anesthesia.  2. Dilatation and hysteroscopy with endometrial resection and curettage.   The patient was placed in lithotomy position and a general anesthesia was  instituted using the LMA.  Exam under anesthesia revealed an anterior uterus  with some thickening in the adnexa.  The cervix was clean.  Prepped and  draped, bladder emptied, and the cervix was progressively dilated to admit  the hysteroscope.  The endometrial cavity showed granular changes  throughout, particularly in the cornu and the fundus.  There was a small  submucous myoma posteriorly.  All of this tissue was resected in a  systematic fashion.  All of the chips were then removed and sent for study.  No unusual blood loss occurred.  At the termination of the procedure a  thorough curettage was performed and the uterus was injected with 30 mL of a  Pitressin solution.  The fluid deficit was 30 mL.  [name] tolerated this  procedure well, was sent to the recovery room in good condition.                                               Katherine Roan, M.D.    SDM/MEDQ  D:  03/18/2003  T:  03/18/2003  Job:  [id number]

## 2010-12-16 ENCOUNTER — Ambulatory Visit (INDEPENDENT_AMBULATORY_CARE_PROVIDER_SITE_OTHER): Payer: BC Managed Care – PPO | Admitting: Internal Medicine

## 2010-12-16 ENCOUNTER — Telehealth: Payer: Self-pay | Admitting: Family Medicine

## 2010-12-16 ENCOUNTER — Encounter: Payer: Self-pay | Admitting: Internal Medicine

## 2010-12-16 VITALS — BP 100/70 | Temp 98.4°F | Wt 183.0 lb

## 2010-12-16 DIAGNOSIS — Z0000 Encounter for general adult medical examination without abnormal findings: Secondary | ICD-10-CM

## 2010-12-16 DIAGNOSIS — R51 Headache: Secondary | ICD-10-CM

## 2010-12-16 DIAGNOSIS — Z23 Encounter for immunization: Secondary | ICD-10-CM

## 2010-12-16 NOTE — Telephone Encounter (Signed)
Suggest followup with Dr. Danielle Dess but will see here if desired; please ask about fever or stiff neck- without these symptoms meningitis seems unlikely in

## 2010-12-16 NOTE — Telephone Encounter (Signed)
Pt rcvd a cortisone shot on 10/15/10, by Dr Danielle Dess. Since then, pt has has severe headaches on back of near near base at top of spine. Pt is wondering is she should be tested for meningitis? Pt has attempted to contact Dr Tonia Brooms office about headache, but has not gotten any help.

## 2010-12-16 NOTE — Patient Instructions (Signed)
Call if you develop fever stiff neck or worsening headache  Return as scheduled for followup with Dr. Tawanna Cooler

## 2010-12-16 NOTE — Telephone Encounter (Signed)
Spoke with patient and appointment made

## 2010-12-16 NOTE — Progress Notes (Signed)
  Subjective:    Patient ID: Jaime Fuller, female    DOB: 01/09/1951, 60 y.o.   MRN: [id number]  HPI  60 year old patient who has a history of  Epidurals her last on August 2. Since that time she's had intermittent headaches. Recently there has been new this in the lay press about academic of meningitis following epidural injections. She was concerned about the possibility of meningitis due to her episodic headaches. She denies any headache today stiff neck or fever    Review of Systems  Constitutional: Negative.   HENT: Positive for congestion. Negative for hearing loss, sore throat, rhinorrhea, dental problem, sinus pressure and tinnitus.   Eyes: Negative for pain, discharge and visual disturbance.  Respiratory: Positive for cough. Negative for shortness of breath.   Cardiovascular: Negative for chest pain, palpitations and leg swelling.  Gastrointestinal: Negative for nausea, vomiting, abdominal pain, diarrhea, constipation, blood in stool and abdominal distention.  Genitourinary: Negative for dysuria, urgency, frequency, hematuria, flank pain, vaginal bleeding, vaginal discharge, difficulty urinating, vaginal pain and pelvic pain.  Musculoskeletal: Negative for joint swelling, arthralgias and gait problem.  Skin: Negative for rash.  Neurological: Positive for headaches. Negative for dizziness, syncope, speech difficulty, weakness and numbness.  Hematological: Negative for adenopathy.  Psychiatric/Behavioral: Negative for behavioral problems, dysphoric mood and agitation. The patient is not nervous/anxious.        Objective:   Physical Exam  Constitutional: She appears well-developed and well-nourished. No distress.       No acute distress  HENT:  Head: Normocephalic.  Right Ear: External ear normal.  Mouth/Throat: Oropharynx is clear and moist.  Eyes: Pupils are equal, round, and reactive to light.  Neck: Normal range of motion. Neck supple. No JVD present.       No meningismus    Cardiovascular: Normal rate.   Pulmonary/Chest: Effort normal and breath sounds normal.  Lymphadenopathy:    She has no cervical adenopathy.          Assessment & Plan:   Status post epidurals Episodic headaches No evidence of meningitis. Patient was reassured;  she will report any clinical worsening

## 2011-04-27 ENCOUNTER — Ambulatory Visit: Payer: BC Managed Care – PPO | Admitting: Family Medicine

## 2011-06-27 ENCOUNTER — Ambulatory Visit: Payer: BC Managed Care – PPO | Admitting: Family

## 2011-07-03 ENCOUNTER — Encounter: Payer: Self-pay | Admitting: Internal Medicine

## 2011-07-03 ENCOUNTER — Telehealth: Payer: Self-pay | Admitting: Family Medicine

## 2011-07-03 ENCOUNTER — Ambulatory Visit (INDEPENDENT_AMBULATORY_CARE_PROVIDER_SITE_OTHER): Payer: BC Managed Care – PPO | Admitting: Internal Medicine

## 2011-07-03 VITALS — BP 100/70 | Temp 98.5°F | Wt 191.0 lb

## 2011-07-03 DIAGNOSIS — F411 Generalized anxiety disorder: Secondary | ICD-10-CM

## 2011-07-03 DIAGNOSIS — R5383 Other fatigue: Secondary | ICD-10-CM

## 2011-07-03 DIAGNOSIS — E039 Hypothyroidism, unspecified: Secondary | ICD-10-CM

## 2011-07-03 DIAGNOSIS — M545 Low back pain, unspecified: Secondary | ICD-10-CM

## 2011-07-03 DIAGNOSIS — R5381 Other malaise: Secondary | ICD-10-CM

## 2011-07-03 LAB — POCT URINALYSIS DIPSTICK
Bilirubin, UA: NEGATIVE
Blood, UA: NEGATIVE
Glucose, UA: NEGATIVE
Ketones, UA: NEGATIVE
Leukocytes, UA: NEGATIVE
Nitrite, UA: NEGATIVE
Protein, UA: NEGATIVE
Spec Grav, UA: 1.01
Urobilinogen, UA: 0.2
pH, UA: 6.5

## 2011-07-03 LAB — COMPREHENSIVE METABOLIC PANEL
ALT: 19 U/L (ref 0–35)
AST: 25 U/L (ref 0–37)
Albumin: 4 g/dL (ref 3.5–5.2)
Alkaline Phosphatase: 51 U/L (ref 39–117)
BUN: 17 mg/dL (ref 6–23)
CO2: 26 mEq/L (ref 19–32)
Calcium: 9.1 mg/dL (ref 8.4–10.5)
Chloride: 110 mEq/L (ref 96–112)
Creatinine, Ser: 0.9 mg/dL (ref 0.4–1.2)
GFR: 68.85 mL/min (ref >60.00)
Glucose, Bld: 88 mg/dL (ref 70–99)
Potassium: 4.8 mEq/L (ref 3.5–5.1)
Sodium: 143 mEq/L (ref 135–145)
Total Bilirubin: 0.5 mg/dL (ref 0.3–1.2)
Total Protein: 6.5 g/dL (ref 6.0–8.3)

## 2011-07-03 LAB — CBC WITH DIFFERENTIAL/PLATELET
Basophils Absolute: 0 10*3/uL (ref 0.0–0.1)
Basophils Relative: 0.7 % (ref 0.0–3.0)
Eosinophils Absolute: 0.1 10*3/uL (ref 0.0–0.7)
Eosinophils Relative: 1.8 % (ref 0.0–5.0)
HCT: 37.2 % (ref 36.0–46.0)
Hemoglobin: 12.3 g/dL (ref 12.0–15.0)
Lymphocytes Relative: 34 % (ref 12.0–46.0)
Lymphs Abs: 1.8 10*3/uL (ref 0.7–4.0)
MCHC: 33.2 g/dL (ref 30.0–36.0)
MCV: 86.6 fl (ref 78.0–100.0)
Monocytes Absolute: 0.4 10*3/uL (ref 0.1–1.0)
Monocytes Relative: 7.5 % (ref 3.0–12.0)
Neutro Abs: 2.9 10*3/uL (ref 1.4–7.7)
Neutrophils Relative %: 56 % (ref 43.0–77.0)
Platelets: 217 10*3/uL (ref 150.0–400.0)
RBC: 4.29 Mil/uL (ref 3.87–5.11)
RDW: 14.4 % (ref 11.5–14.6)
WBC: 5.2 10*3/uL (ref 4.5–10.5)

## 2011-07-03 LAB — CK: Total CK: 63 U/L (ref 7–177)

## 2011-07-03 LAB — SEDIMENTATION RATE: Sed Rate: 15 mm/hr (ref 0–22)

## 2011-07-03 NOTE — Progress Notes (Signed)
  Subjective:    Patient ID: Jaime Fuller, female    DOB: 1951/02/19, 61 y.o.   MRN: [id number]  HPI  61 year old patient who is seen today with a chief complaint of fatigue. She states this has been an issue for her 3 months. However this has been listed on her problem list as well. She feels poorly when she awakes in the morning and states that she could go back to bed and sleep for hours. She also describes some mild nonspecific tingling of all her extremities. She does have a history of chronic UTIs. She has seen her gynecologist recently. UA today was normal over the weekend she did have some urgency which has improved. Denies any fever or chills. She does complain of some occasional left lower quadrant crampy abdominal pain. She does have a history of anxiety and does take Klonopin daily. She is on supplemental Synthroid but has had no recent lab    Review of Systems  Constitutional: Positive for fatigue.  HENT: Negative for hearing loss, congestion, sore throat, rhinorrhea, dental problem, sinus pressure and tinnitus.   Eyes: Negative for pain, discharge and visual disturbance.  Respiratory: Negative for cough and shortness of breath.   Cardiovascular: Negative for chest pain, palpitations and leg swelling.  Gastrointestinal: Negative for nausea, vomiting, abdominal pain, diarrhea, constipation, blood in stool and abdominal distention.  Genitourinary: Negative for dysuria, urgency, frequency, hematuria, flank pain, vaginal bleeding, vaginal discharge, difficulty urinating, vaginal pain and pelvic pain.  Musculoskeletal: Negative for joint swelling, arthralgias and gait problem.  Skin: Negative for rash.  Neurological: Positive for weakness, light-headedness and numbness. Negative for dizziness, syncope, speech difficulty and headaches.  Hematological: Negative for adenopathy.  Psychiatric/Behavioral: Negative for behavioral problems, dysphoric mood and agitation. The patient is not  nervous/anxious.        Objective:   Physical Exam  Constitutional: She is oriented to person, place, and time. She appears well-developed and well-nourished.       Overweight. Afebrile. In no distress. Blood pressure low normal  HENT:  Head: Normocephalic.  Right Ear: External ear normal.  Left Ear: External ear normal.  Mouth/Throat: Oropharynx is clear and moist.  Eyes: Conjunctivae and EOM are normal. Pupils are equal, round, and reactive to light.  Neck: Normal range of motion. Neck supple. No thyromegaly present.  Cardiovascular: Normal rate, regular rhythm, normal heart sounds and intact distal pulses.   Pulmonary/Chest: Effort normal and breath sounds normal.  Abdominal: Soft. Bowel sounds are normal. She exhibits no mass. There is no tenderness.  Musculoskeletal: Normal range of motion.  Lymphadenopathy:    She has no cervical adenopathy.  Neurological: She is alert and oriented to person, place, and time.  Skin: Skin is warm and dry. No rash noted.  Psychiatric: She has a normal mood and affect. Her behavior is normal.          Assessment & Plan:   Fatigue. Patient states this has been present for 3 months but apparently has been intermittent problem for longer period we'll check some updated lab including TSH and sedimentation rate and CPK Hypothyroidism History chronic UTI. No evidence of UTI at the present time

## 2011-07-03 NOTE — Patient Instructions (Signed)
It is important that you exercise regularly, at least 20 minutes 3 to 4 times per week.  If you develop chest pain or shortness of breath seek  medical attention.  Return in one month for follow-up  You need to lose weight.  Consider a lower calorie diet and regular exercise.

## 2011-07-03 NOTE — Telephone Encounter (Signed)
Opened in error

## 2011-07-04 ENCOUNTER — Telehealth: Payer: Self-pay | Admitting: *Deleted

## 2011-07-04 LAB — TSH: TSH: 3.63 u[IU]/mL (ref 0.35–5.50)

## 2011-07-04 NOTE — Telephone Encounter (Signed)
Pt would like to have her lab results called to her, please.

## 2011-07-04 NOTE — Telephone Encounter (Signed)
Spoke with pt- informed labs normal - states still having problem with diarrhea - instructed to do clear liquids and soft food s - no sugar , no fried foods, no red meats, no spicy foods - discussed brat diet for next 24-48hrs - ok to use immodium - if no improvement by Thursday - call

## 2011-08-31 ENCOUNTER — Other Ambulatory Visit: Payer: Self-pay | Admitting: *Deleted

## 2011-08-31 DIAGNOSIS — Z0000 Encounter for general adult medical examination without abnormal findings: Secondary | ICD-10-CM

## 2011-09-15 ENCOUNTER — Other Ambulatory Visit: Payer: BC Managed Care – PPO

## 2011-09-21 ENCOUNTER — Encounter: Payer: Self-pay | Admitting: Family Medicine

## 2011-09-21 ENCOUNTER — Ambulatory Visit (INDEPENDENT_AMBULATORY_CARE_PROVIDER_SITE_OTHER): Payer: BC Managed Care – PPO | Admitting: Family Medicine

## 2011-09-21 VITALS — BP 118/70 | Temp 98.3°F | Ht 62.0 in | Wt 187.0 lb

## 2011-09-21 DIAGNOSIS — Z0000 Encounter for general adult medical examination without abnormal findings: Secondary | ICD-10-CM

## 2011-09-21 DIAGNOSIS — D239 Other benign neoplasm of skin, unspecified: Secondary | ICD-10-CM | POA: Insufficient documentation

## 2011-09-21 DIAGNOSIS — D510 Vitamin B12 deficiency anemia due to intrinsic factor deficiency: Secondary | ICD-10-CM

## 2011-09-21 DIAGNOSIS — F411 Generalized anxiety disorder: Secondary | ICD-10-CM

## 2011-09-21 DIAGNOSIS — E039 Hypothyroidism, unspecified: Secondary | ICD-10-CM

## 2011-09-21 DIAGNOSIS — R5381 Other malaise: Secondary | ICD-10-CM

## 2011-09-21 DIAGNOSIS — R5383 Other fatigue: Secondary | ICD-10-CM

## 2011-09-21 LAB — BASIC METABOLIC PANEL
BUN: 18 mg/dL (ref 6–23)
CO2: 28 mEq/L (ref 19–32)
Calcium: 9.1 mg/dL (ref 8.4–10.5)
Chloride: 104 mEq/L (ref 96–112)
Creatinine, Ser: 0.9 mg/dL (ref 0.4–1.2)
GFR: 71.57 mL/min (ref >60.00)
Glucose, Bld: 90 mg/dL (ref 70–99)
Potassium: 4.1 mEq/L (ref 3.5–5.1)
Sodium: 139 mEq/L (ref 135–145)

## 2011-09-21 LAB — LIPID PANEL
Cholesterol: 133 mg/dL (ref 0–200)
HDL: 59.9 mg/dL (ref >39.00)
LDL Cholesterol: 63 mg/dL (ref 0–99)
Total CHOL/HDL Ratio: 2
Triglycerides: 51 mg/dL (ref 0.0–149.0)
VLDL: 10.2 mg/dL (ref 0.0–40.0)

## 2011-09-21 LAB — HEPATIC FUNCTION PANEL
ALT: 23 U/L (ref 0–35)
AST: 31 U/L (ref 0–37)
Albumin: 4 g/dL (ref 3.5–5.2)
Alkaline Phosphatase: 57 U/L (ref 39–117)
Bilirubin, Direct: 0.1 mg/dL (ref 0.0–0.3)
Total Bilirubin: 0.5 mg/dL (ref 0.3–1.2)
Total Protein: 7 g/dL (ref 6.0–8.3)

## 2011-09-21 LAB — CBC WITH DIFFERENTIAL/PLATELET
Basophils Absolute: 0 10*3/uL (ref 0.0–0.1)
Basophils Relative: 0.6 % (ref 0.0–3.0)
Eosinophils Absolute: 0.1 10*3/uL (ref 0.0–0.7)
Eosinophils Relative: 1.5 % (ref 0.0–5.0)
HCT: 37.3 % (ref 36.0–46.0)
Hemoglobin: 12.4 g/dL (ref 12.0–15.0)
Lymphocytes Relative: 42 % (ref 12.0–46.0)
Lymphs Abs: 1.9 10*3/uL (ref 0.7–4.0)
MCHC: 33.2 g/dL (ref 30.0–36.0)
MCV: 85.9 fl (ref 78.0–100.0)
Monocytes Absolute: 0.3 10*3/uL (ref 0.1–1.0)
Monocytes Relative: 7 % (ref 3.0–12.0)
Neutro Abs: 2.3 10*3/uL (ref 1.4–7.7)
Neutrophils Relative %: 48.9 % (ref 43.0–77.0)
Platelets: 226 10*3/uL (ref 150.0–400.0)
RBC: 4.35 Mil/uL (ref 3.87–5.11)
RDW: 13.8 % (ref 11.5–14.6)
WBC: 4.6 10*3/uL (ref 4.5–10.5)

## 2011-09-21 LAB — POCT URINALYSIS DIPSTICK
Bilirubin, UA: NEGATIVE
Blood, UA: NEGATIVE
Glucose, UA: NEGATIVE
Ketones, UA: NEGATIVE
Nitrite, UA: NEGATIVE
Protein, UA: NEGATIVE
Spec Grav, UA: <=1.005
Urobilinogen, UA: 0.2
pH, UA: 6

## 2011-09-21 LAB — TSH: TSH: 4.33 u[IU]/mL (ref 0.35–5.50)

## 2011-09-21 MED ORDER — CLONAZEPAM 0.5 MG PO TABS: 0.5000 mg | ORAL_TABLET | Freq: Two times a day (BID) | ORAL | Status: DC

## 2011-09-21 MED ORDER — CYANOCOBALAMIN 1000 MCG/ML IJ SOLN: 1000.0000 ug | INTRAMUSCULAR | Status: DC

## 2011-09-21 MED ORDER — LEVOTHYROXINE SODIUM 50 MCG PO TABS: 50.0000 ug | ORAL_TABLET | Freq: Every day | ORAL | Status: DC

## 2011-09-21 NOTE — Progress Notes (Signed)
  Subjective:    Patient ID: Jaime Fuller, female    DOB: 05-Jun-1950, 61 y.o.   MRN: [id number]  HPI Jaime Fuller is a 61 year old married female nonsmoker who comes in today for general physical examination  She takes Klonopin 0.5 twice a day when necessary for anxiety and takes about 90 pills per year  She takes vitamin B12 1 cc monthly because of a history of pernicious anemia  She takes Synthroid 50 mcg daily for hypothyroidism. She's been out of her Synthroid now for about a month.  Recommend  [facility]  for routine eye care, regular dental care, BSE monthly, and you mammography was done in the spring of this year Dr. Tenny Craw his office she also had a Pap smear. Recommend Pap every 3 years and she's otherwise normal, colonoscopy and GI, tetanus 2012.  She has a nonhealing lesion on her right hand which we will remove today because his appears to be a superficial skin cancer   Review of Systems  Constitutional: Negative.   HENT: Negative.   Eyes: Negative.   Respiratory: Negative.   Cardiovascular: Negative.   Gastrointestinal: Negative.   Genitourinary: Negative.   Musculoskeletal: Negative.   Neurological: Negative.   Hematological: Negative.   Psychiatric/Behavioral: Negative.        Objective:   Physical Exam  Constitutional: She appears well-developed and well-nourished.  HENT:  Head: Normocephalic and atraumatic.  Right Ear: External ear normal.  Left Ear: External ear normal.  Nose: Nose normal.  Mouth/Throat: Oropharynx is clear and moist.  Eyes: EOM are normal. Pupils are equal, round, and reactive to light.  Neck: Normal range of motion. Neck supple. No thyromegaly present.  Cardiovascular: Normal rate, regular rhythm, normal heart sounds and intact distal pulses.  Exam reveals no gallop and no friction rub.   No murmur heard. Pulmonary/Chest: Effort normal and breath sounds normal.  Abdominal: Soft. Bowel sounds are normal. She exhibits no distension and no mass. There is  no tenderness. There is no rebound.  Genitourinary:       Bilateral breast exam normal BSE was taught  Musculoskeletal: Normal range of motion.  Lymphadenopathy:    She has no cervical adenopathy.  Neurological: She is alert. She has normal reflexes. No cranial nerve deficit. She exhibits normal muscle tone. Coordination normal.  Skin: Skin is warm and dry.       Total body skin exam normal except for a nonhealing lesion on her right hand which will be removed today  Psychiatric: She has a normal mood and affect. Her behavior is normal. Judgment and thought content normal.          Assessment & Plan:  Healthy female  History of anxiety continue Klonopin 0.5 twice a day  B12 deficiency probably secondary to gastric surgery continue B12 1 cc monthly  History of hypothyroidism continue Synthroid 50 mcg daily  Obesity status post gastric surgery was abdominoplasty currently weight 187 stable  After informed consent she was taken to the treatment room the lesion which was 8 mm x8 mm on the dorsal right hand was removed with 1% Xylocaine with epinephrine. The bases cauterized Band-Aids was applied it was sent for pathologic analysis. Clinically it appears to be a dysplastic nevus Path pending

## 2011-09-21 NOTE — Patient Instructions (Signed)
Continue your current medications  Followup in 1 year sooner if any problems 

## 2011-09-22 ENCOUNTER — Telehealth: Payer: Self-pay | Admitting: *Deleted

## 2011-09-22 DIAGNOSIS — E039 Hypothyroidism, unspecified: Secondary | ICD-10-CM

## 2011-09-22 MED ORDER — LEVOTHYROXINE SODIUM 50 MCG PO TABS: 50.0000 ug | ORAL_TABLET | Freq: Every day | ORAL | Status: DC

## 2011-09-22 NOTE — Telephone Encounter (Signed)
Left message on machine for patient ok to stop synthroid until symptoms return per dr todd

## 2011-09-22 NOTE — Telephone Encounter (Signed)
Patient is aware of her lab results.  However she states that she has not take her synthroid in well over a month. Should she start taking her synthroid again?

## 2011-12-23 ENCOUNTER — Emergency Department (HOSPITAL_COMMUNITY): Payer: BC Managed Care – PPO

## 2011-12-23 ENCOUNTER — Emergency Department (HOSPITAL_COMMUNITY)
Admission: EM | Admit: 2011-12-23 | Discharge: 2011-12-24 | Disposition: A | Discharge status: Home / Self Care | Payer: BC Managed Care – PPO | Attending: Emergency Medicine | Admitting: Emergency Medicine

## 2011-12-23 ENCOUNTER — Encounter (HOSPITAL_COMMUNITY): Payer: Self-pay | Admitting: *Deleted

## 2011-12-23 DIAGNOSIS — Z9884 Bariatric surgery status: Secondary | ICD-10-CM | POA: Insufficient documentation

## 2011-12-23 DIAGNOSIS — R109 Unspecified abdominal pain: Secondary | ICD-10-CM

## 2011-12-23 DIAGNOSIS — E039 Hypothyroidism, unspecified: Secondary | ICD-10-CM | POA: Insufficient documentation

## 2011-12-23 DIAGNOSIS — R1013 Epigastric pain: Secondary | ICD-10-CM | POA: Insufficient documentation

## 2011-12-23 DIAGNOSIS — F411 Generalized anxiety disorder: Secondary | ICD-10-CM | POA: Insufficient documentation

## 2011-12-23 LAB — CBC
HCT: 38.1 % (ref 36.0–46.0)
Hemoglobin: 12.7 g/dL (ref 12.0–15.0)
MCH: 28.3 pg (ref 26.0–34.0)
MCHC: 33.3 g/dL (ref 30.0–36.0)
MCV: 84.9 fL (ref 78.0–100.0)
Platelets: 257 10*3/uL (ref 150–400)
RBC: 4.49 MIL/uL (ref 3.87–5.11)
RDW: 13.7 % (ref 11.5–15.5)
WBC: 5.8 10*3/uL (ref 4.0–10.5)

## 2011-12-23 LAB — BASIC METABOLIC PANEL
BUN: 16 mg/dL (ref 6–23)
CO2: 28 mEq/L (ref 19–32)
Calcium: 9.9 mg/dL (ref 8.4–10.5)
Chloride: 104 mEq/L (ref 96–112)
Creatinine, Ser: 0.76 mg/dL (ref 0.50–1.10)
GFR calc Af Amer: >90 mL/min (ref >90)
GFR calc non Af Amer: 90 mL/min — ABNORMAL LOW (ref >90)
Glucose, Bld: 97 mg/dL (ref 70–99)
Potassium: 4.7 mEq/L (ref 3.5–5.1)
Sodium: 142 mEq/L (ref 135–145)

## 2011-12-23 LAB — LIPASE, BLOOD: Lipase: 48 U/L (ref 11–59)

## 2011-12-23 MED ORDER — ONDANSETRON HCL 4 MG/2ML IJ SOLN
4.0000 mg | Freq: Once | INTRAMUSCULAR | Status: AC
  Administered 2011-12-23: 4 mg via INTRAVENOUS
  Filled 2011-12-23: qty 2

## 2011-12-23 MED ORDER — GI COCKTAIL ~~LOC~~
30.0000 mL | Freq: Once | ORAL | Status: AC
  Administered 2011-12-23: 30 mL via ORAL
  Filled 2011-12-23: qty 30

## 2011-12-23 MED ORDER — IOHEXOL 300 MG/ML  SOLN
100.0000 mL | Freq: Once | INTRAMUSCULAR | Status: AC | PRN
  Administered 2011-12-23: 100 mL via INTRAVENOUS

## 2011-12-23 MED ORDER — HYDROMORPHONE HCL PF 1 MG/ML IJ SOLN
1.0000 mg | Freq: Once | INTRAMUSCULAR | Status: AC
  Administered 2011-12-23: 1 mg via INTRAVENOUS
  Filled 2011-12-23: qty 1

## 2011-12-23 MED ORDER — MORPHINE SULFATE 4 MG/ML IJ SOLN
4.0000 mg | Freq: Once | INTRAMUSCULAR | Status: AC
  Administered 2011-12-24: 4 mg via INTRAVENOUS
  Filled 2011-12-23: qty 1

## 2011-12-23 MED ORDER — IOHEXOL 300 MG/ML  SOLN
20.0000 mL | INTRAMUSCULAR | Status: AC
  Administered 2011-12-23 (×2): 20 mL via ORAL

## 2011-12-23 NOTE — ED Notes (Signed)
Pt amb to BR without problem. Husband at bedside.

## 2011-12-23 NOTE — ED Notes (Signed)
Called for pt to go to exam room no answer pt placed back in triage waiting

## 2011-12-23 NOTE — ED Notes (Signed)
Pt anxious 

## 2011-12-23 NOTE — ED Notes (Signed)
States that her pain is worse. Point to epigastric area.  Provider at bedside

## 2011-12-23 NOTE — ED Notes (Signed)
Patient reports onset of chest pain at 1700.  She states she was resting.  She states the pain makes her chest feel like it is going to blow up.  She states the pain is radiating down her back as well.  Patient had diarrhea this morning. Patient reports onset of nausea prior to coming to Ed.  Patient denies hx of heart problems.  She states her pain feels like acute pancreatitis

## 2011-12-23 NOTE — ED Notes (Signed)
Patient is resting comfortably. 

## 2011-12-23 NOTE — ED Provider Notes (Signed)
History     CSN: [id number]  Arrival date & time 12/23/11  1821   First MD Initiated Contact with Patient 12/23/11 1946      Chief Complaint  Patient presents with  . Chest Injury    (Consider location/radiation/quality/duration/timing/severity/associated sxs/prior treatment) HPI Complains of epigastric pain radiating to her back onset 5 PM today sharp in nature feels like pancreatitis she's had in the past however pancreatitis pain was constant and accompanied by vomiting patient reports she is unable to vomit since she's had gastric bypass surgery several years ago she denies nausea at present she denies shortness of breath denies other complaint denies cough and denies myalgias presently discomfort is minimal at the center of her back she denies abdominal pain no treatment prior to coming here nothing makes pain better or worse pain is intermittent lasting approximately 3 minutes at a time. No there has been no chest injury Past Medical History  Diagnosis Date  . Anxiety state, unspecified 09/20/2007  . FATIGUE 03/08/2007  . HYPOTHYROIDISM 03/08/2007  . Pain in joint, site unspecified 02/08/2007  . Pancreatitis     secondary to ERCP    Past Surgical History  Procedure Date  . Gastric bypass   . Cholecystectomy   . Carpal tunnel release   . Wisdom tooth extraction   . Abdominal plasty     No family history on file.  History  Substance Use Topics  . Smoking status: Former Games developer  . Smokeless tobacco: Not on file  . Alcohol Use: No    OB History    Grav Para Term Preterm Abortions TAB SAB Ect Mult Living                  Review of Systems  Constitutional: Negative.   HENT: Negative.   Respiratory: Negative.   Cardiovascular: Negative.   Gastrointestinal: Positive for abdominal pain.  Musculoskeletal: Negative.   Skin: Negative.   Neurological: Negative.   Hematological: Negative.   Psychiatric/Behavioral: Negative.   All other systems reviewed and are  negative.    Allergies  Vicodin  Home Medications   Current Outpatient Rx  Name Route Sig Dispense Refill  . CLONAZEPAM 0.5 MG PO TABS Oral Take 1 tablet (0.5 mg total) by mouth 2 (two) times daily. 100 tablet 1  . CYANOCOBALAMIN 1000 MCG/ML IJ SOLN Intramuscular Inject 1 mL (1,000 mcg total) into the muscle every 30 (thirty) days. 30 mL 1  . LEVOTHYROXINE SODIUM 50 MCG PO TABS Oral Take 1 tablet (50 mcg total) by mouth daily. 100 tablet 3  . LEVOTHYROXINE SODIUM 50 MCG PO TABS Oral Take 1 tablet (50 mcg total) by mouth daily. 100 tablet 3    BP 107/64  Pulse 64  Temp 98.7 F (37.1 C) (Oral)  Resp 16  Ht 5\' 3"  (1.6 m)  Wt 183 lb (83.008 kg)  BMI 32.42 kg/m2  SpO2 99%  Physical Exam  Nursing note and vitals reviewed. Constitutional: She appears well-developed and well-nourished.  HENT:  Head: Normocephalic and atraumatic.  Eyes: Conjunctivae normal are normal. Pupils are equal, round, and reactive to light.  Neck: Neck supple. No tracheal deviation present. No thyromegaly present.  Cardiovascular: Normal rate and regular rhythm.   No murmur heard. Pulmonary/Chest: Effort normal and breath sounds normal.  Abdominal: Soft. Bowel sounds are normal. She exhibits no distension. There is no tenderness.       OBese  Musculoskeletal: Normal range of motion. She exhibits no edema and no tenderness.  Neurological:  She is alert. Coordination normal.  Skin: Skin is warm and dry. No rash noted.  Psychiatric: She has a normal mood and affect.    ED Course  Procedures (including critical care time)   Labs Reviewed  CBC  BASIC METABOLIC PANEL  LIPASE, BLOOD   No results found.   No diagnosis found.  Date: 12/23/2011  Rate: 65  Rhythm: normal sinus rhythm  QRS Axis: normal  Intervals: normal  ST/T Wave abnormalities: normal  Conduction Disutrbances:none  Narrative Interpretation:   Old EKG Reviewed: unchanged from 03/16/2003  Results for orders placed during the  hospital encounter of 12/23/11  CBC      Component Value Range   WBC 5.8  4.0 - 10.5 K/uL   RBC 4.49  3.87 - 5.11 MIL/uL   Hemoglobin 12.7  12.0 - 15.0 g/dL   HCT 46.9  62.9 - 52.8 %   MCV 84.9  78.0 - 100.0 fL   MCH 28.3  26.0 - 34.0 pg   MCHC 33.3  30.0 - 36.0 g/dL   RDW 41.3  24.4 - 01.0 %   Platelets 257  150 - 400 K/uL  BASIC METABOLIC PANEL      Component Value Range   Sodium 142  135 - 145 mEq/L   Potassium 4.7  3.5 - 5.1 mEq/L   Chloride 104  96 - 112 mEq/L   CO2 28  19 - 32 mEq/L   Glucose, Bld 97  70 - 99 mg/dL   BUN 16  6 - 23 mg/dL   Creatinine, Ser 2.72  0.50 - 1.10 mg/dL   Calcium 9.9  8.4 - 53.6 mg/dL   GFR calc non Af Amer 90 (*) >90 mL/min   GFR calc Af Amer >90  >90 mL/min  LIPASE, BLOOD      Component Value Range   Lipase 48  11 - 59 U/L   Ct Abdomen Pelvis W Contrast  12/23/2011  *RADIOLOGY REPORT*  Clinical Data: Chest and back pain.  Diarrhea.  Nausea.  Epigastric pain.  CT ABDOMEN AND PELVIS WITH CONTRAST  Technique:  Multidetector CT imaging of the abdomen and pelvis was performed following the standard protocol during bolus administration of intravenous contrast.  Contrast: OMNIPAQUE IOHEXOL 300 MG/ML  SOLN  Comparison: None.  Findings: Borderline intrahepatic biliary dilatation noted.  Common bile duct 10 mm.  Gallbladder surgically absent.  Liver otherwise unremarkable.  The spleen and adrenal glands appear normal.  Pancreas appears normal.  No peripancreatic stranding observed.  Prior gastric bypass noted.  Contrast medium in the excluded stomach observed.  The kidneys appear unremarkable, as do the proximal ureters.  No pathologic retroperitoneal or porta hepatis adenopathy is identified.  Appendix unremarkable.  No dilated bowel noted.  The uterus and adnexa appear unremarkable.  Urinary bladder unremarkable.  Lumbar spondylosis with posterior osseous ridging particularly at L2-3, L3-4, and L4-5, and with intervertebral spurring extending into the  neural foramina at L3-4 and L4-5.  IMPRESSION:  1.  Prior gastrojejunostomy.  Contrast in the excluded stomach is indicative of chronic leak into the excluded stomach. 2.  Mild biliary dilatation, probably secondary to prior cholecystectomy. 3.  No CT findings of acute pancreatitis.  Biochemical markers can be more sensitive.   Original Report Authenticated By: Dellia Cloud, M.D.      MDM  Pain seems spasmodic. To obtain CT scan to rule out aortic dissection or other pathology, symptomatic treatment Symptoms atypical for acute coronary syndrome Diagnosis abdominal pain  Doug Sou, MD 12/23/11 [phone]

## 2011-12-23 NOTE — ED Notes (Signed)
Pt to CT scan via stretcher.

## 2011-12-24 MED ORDER — OXYCODONE-ACETAMINOPHEN 5-325 MG PO TABS: 2.0000 | ORAL_TABLET | ORAL | Status: DC | PRN

## 2011-12-24 MED ORDER — PROMETHAZINE HCL 25 MG RE SUPP: 25.0000 mg | Freq: Four times a day (QID) | RECTAL | Status: DC | PRN

## 2011-12-24 MED ORDER — ONDANSETRON HCL 4 MG PO TABS: 4.0000 mg | ORAL_TABLET | Freq: Four times a day (QID) | ORAL | Status: DC

## 2011-12-24 NOTE — ED Provider Notes (Signed)
3458  61 year old female coming to CDU for CT of the abdomen and epigastric pain. Pain is similar to the pain she had in 08/22/06 was she had pancreatitis. Will all be also be checking her troponins. EKG is unremarkable. CBC and the mid pending.  2240 patient having severe pain 10 out of 10 in her epigastric area radiating to her back. Dilaudid 1 mg IV given. We will give a GI cocktail as well after that a lot of the dead for 10 minutes.   08/21/13 patient states that the daughter brought her pain level down to a 7/10. After the GI cocktail she states that her pain went down to a 6 or 5/10. States that the pain feels like its coming in waves. CT of the abdomen shows no acute process. Mild biliary dilation probably due to her prior cholecystectomy.  08-22-2354 patient feels better after pain medication and nausea medication. We'll give her some narcotic pain medication to go home with. Patient will followup with Dr. Arlyce Dice on Monday. Patient and husband agree with plans and are ready for discharge. Patient and husband understand if she gets worse she is to come back to the ER.   Remi Haggard, NP 12/24/11 [phone]

## 2011-12-24 NOTE — ED Provider Notes (Signed)
Medical screening examination/treatment/procedure(s) were conducted as a shared visit with non-physician practitioner(s) and myself.  I personally evaluated the patient during the encounter  Doug Sou, MD 12/24/11 0025

## 2011-12-25 ENCOUNTER — Telehealth: Payer: Self-pay | Admitting: Internal Medicine

## 2011-12-25 ENCOUNTER — Encounter: Payer: Self-pay | Admitting: *Deleted

## 2011-12-25 LAB — POCT I-STAT TROPONIN I
Troponin i, poc: 0 ng/mL (ref 0.00–0.08)
Troponin i, poc: 0 ng/mL (ref 0.00–0.08)

## 2011-12-25 NOTE — Telephone Encounter (Signed)
Unfortunately there are no notes in the EPIC so I don't know the history. It is appropriate for her to see [facility] tomorrow.

## 2011-12-25 NOTE — Telephone Encounter (Signed)
Patient reports she went to ED this weekend for chest pain that was similar to the pain she had in 1996 when she had pancreatitis. This pain was different in that it was spasm like. She had a CT, labs and EKG. She was given Morphine which relieved the pain. She is not in pain at this time. She was told it could be a spastic pancreas or a stone. Scheduled patient with Willette Cluster, NP on 12/26/11 at 10:00 AM.

## 2011-12-26 ENCOUNTER — Ambulatory Visit (INDEPENDENT_AMBULATORY_CARE_PROVIDER_SITE_OTHER): Payer: BC Managed Care – PPO | Admitting: Nurse Practitioner

## 2011-12-26 ENCOUNTER — Encounter: Payer: Self-pay | Admitting: Nurse Practitioner

## 2011-12-26 VITALS — BP 100/66 | HR 88 | Ht 63.0 in | Wt 185.0 lb

## 2011-12-26 DIAGNOSIS — R1013 Epigastric pain: Secondary | ICD-10-CM | POA: Insufficient documentation

## 2011-12-26 NOTE — Patient Instructions (Addendum)
Willette Cluster ACNP will call you on your cell phone after she talks with Dr. Arlyce Dice.

## 2011-12-26 NOTE — Progress Notes (Signed)
12/26/2011 Jaime Fuller [id number] March 09, 1951   HISTORY OF PRESENT ILLNESS: Patient is a 61 year old, s/p gastric bypass 2004  by Dr. Juanda Chance for evaluation of anemia. She underwent a colonoscopy February 2006 which was normal . In August 2006 she had an normal upper endoscopy with small bowel biopsies.  Patient gives a history of a cholecystectomy  In her 86's for symptmatic gallstones. In 1996 she developed pain similar to biliary colic. Patient has an ERCP by Dr. Arlyce Dice. According to the patient, no stones were in the CBD. Patient got post-ERCP pancreatitis.. She had no other abdominal pain until 3 years ago when she began getting recurrent, intermittent epigastric pain. Episodes would occur about once a month and were not related to meals. On 10/12 patient had a prolonged episode of pain. Pain would last a few minutes, abate, then recur. Patient finally went to ED, unfortuately no LFTs were done. Her lipase was normal. CTscan revealed prominent intrahepatic duct.   Past Medical History  Diagnosis Date  . Anxiety state, unspecified 09/20/2007  . FATIGUE 03/08/2007  . HYPOTHYROIDISM 03/08/2007  . Pain in joint, site unspecified 02/08/2007  . Pancreatitis     secondary to ERCP   Past Surgical History  Procedure Date  . Gastric bypass   . Cholecystectomy   . Abdominal plasty   . Childbirth x 2   . Wisdom tooth extraction   . Carpel tunnel surgery left hand     reports that she has quit smoking. She has never used smokeless tobacco. She reports that she does not drink alcohol or use illicit drugs. family history includes Heart attack in her father and Lung cancer in her father. Allergies  Allergen Reactions  . Vicodin (Hydrocodone-Acetaminophen) Itching      Outpatient Encounter Prescriptions as of 12/26/2011  Medication Sig Dispense Refill  . clonazePAM (KLONOPIN) 1 MG tablet Take 1 mg by mouth 2 (two) times daily as needed.      . Cyanocobalamin 1000 MCG/ML KIT Inject as  directed. Every thirty days      . DISCONTD: levothyroxine (SYNTHROID, LEVOTHROID) 50 MCG tablet Take 1 tablet (50 mcg total) by mouth daily.  100 tablet  3  . DISCONTD: ondansetron (ZOFRAN) 4 MG tablet Take 1 tablet (4 mg total) by mouth every 6 (six) hours.  12 tablet  0  . DISCONTD: oxyCODONE-acetaminophen (PERCOCET/ROXICET) 5-325 MG per tablet Take 2 tablets by mouth every 4 (four) hours as needed for pain.  6 tablet  0  . DISCONTD: promethazine (PHENERGAN) 25 MG suppository Place 1 suppository (25 mg total) rectally every 6 (six) hours as needed for nausea.  12 each  0     REVIEW OF SYSTEMS  : Positive for arthritis, back pain, frequent urination. All other systems reviewed and negative except where noted in the History of Present Illness.   PHYSICAL EXAM: BP 100/66  Pulse 88  Ht 5\' 3"  (1.6 m)  Wt 185 lb (83.915 kg)  BMI 32.77 kg/m2 General: Well developed white female in no acute distress Head: Normocephalic and atraumatic Eyes:  sclerae anicteric,conjunctive pink. Ears: Normal auditory acuity Neck: Supple, no masses.  Lungs: Clear throughout to auscultation Heart: Regular rate and rhythm Abdomen: Soft, nontender, non distended. No masses or hepatomegaly noted. Normal bowel sounds Musculoskeletal: Symmetrical with no gross deformities  Skin: No lesions on visible extremities Extremities: No edema  Neurological: Alert oriented x 4, grossly nonfocal Cervical Nodes:  No significant cervical adenopathy Psychological:  Alert and cooperative. Normal mood and affect  ASSESSMENT AND PLAN:  74. 61 year old female with three year history of intermittent radiating epigastric pain mimicking remote "gallstone attacks" She had a severe episode of pain a few days ago and went to ED. Patient gives a history of same scenario in 1996 but states no CBD stones were found on ERCP by Dr. Arlyce Dice. I cannot locate that procedure note (? biliary sphincterotomy done). Rule out CBD stones. Rule out  sphincter of oddi dysfunction. PUD is a distant possibility. Patient has been asymptomatic since 10/12. I spoke with Dr. Arlyce Dice regarding his thoughts about MRCP. Plan is to get baseline LFTs now and proceed with MRCP.  If LFTs are normal patient will need to come for repeat LFTs during an acute attack.. Will call her with test results and further recommendations.  2. History of gastrojejunostomy 2004    Note: Patient's history with Dr. Arlyce Dice dates back to 1990's. At the time of our office visit the patient didn't mention she had outpatient upper and lower endoscopies for anemia in 2006 by Dr. Juanda Chance. I spoke with Dr. Juanda Chance who feels transfer to care to Dr. Arlyce Dice is appropriate .

## 2011-12-27 ENCOUNTER — Telehealth: Payer: Self-pay | Admitting: *Deleted

## 2011-12-27 ENCOUNTER — Encounter: Payer: Self-pay | Admitting: Nurse Practitioner

## 2011-12-27 ENCOUNTER — Other Ambulatory Visit: Payer: Self-pay | Admitting: *Deleted

## 2011-12-27 DIAGNOSIS — Z8719 Personal history of other diseases of the digestive system: Secondary | ICD-10-CM

## 2011-12-27 DIAGNOSIS — R1013 Epigastric pain: Secondary | ICD-10-CM

## 2011-12-27 NOTE — Progress Notes (Signed)
Reviewed and agree with management. Robert D. Kaplan, M.D., FACG  

## 2011-12-27 NOTE — Telephone Encounter (Signed)
Called pt to advise Dr Arlyce Dice and Willette Cluster ACNP wants her to have a blood test for Liver functions, Hepatic panel. She said she will come tomorrow the 16th.

## 2011-12-27 NOTE — Telephone Encounter (Signed)
Called patient and left message, see previous phone note dated 12-27-2011.

## 2011-12-28 ENCOUNTER — Other Ambulatory Visit (INDEPENDENT_AMBULATORY_CARE_PROVIDER_SITE_OTHER): Payer: BC Managed Care – PPO

## 2011-12-28 DIAGNOSIS — Z8719 Personal history of other diseases of the digestive system: Secondary | ICD-10-CM

## 2011-12-28 DIAGNOSIS — R1013 Epigastric pain: Secondary | ICD-10-CM

## 2011-12-28 LAB — HEPATIC FUNCTION PANEL
ALT: 195 U/L — ABNORMAL HIGH (ref 0–35)
AST: 62 U/L — ABNORMAL HIGH (ref 0–37)
Albumin: 3.6 g/dL (ref 3.5–5.2)
Alkaline Phosphatase: 84 U/L (ref 39–117)
Bilirubin, Direct: 0.2 mg/dL (ref 0.0–0.3)
Total Bilirubin: 0.4 mg/dL (ref 0.3–1.2)
Total Protein: 6.7 g/dL (ref 6.0–8.3)

## 2012-01-01 ENCOUNTER — Telehealth: Payer: Self-pay | Admitting: *Deleted

## 2012-01-01 ENCOUNTER — Other Ambulatory Visit: Payer: Self-pay | Admitting: *Deleted

## 2012-01-01 DIAGNOSIS — R1013 Epigastric pain: Secondary | ICD-10-CM

## 2012-01-01 DIAGNOSIS — R7989 Other specified abnormal findings of blood chemistry: Secondary | ICD-10-CM

## 2012-01-01 DIAGNOSIS — R1011 Right upper quadrant pain: Secondary | ICD-10-CM

## 2012-01-01 DIAGNOSIS — R109 Unspecified abdominal pain: Secondary | ICD-10-CM

## 2012-01-01 NOTE — Telephone Encounter (Signed)
Advised patient that Dr. Arlyce Dice and Willette Cluster ACNP advises she

## 2012-01-01 NOTE — Telephone Encounter (Signed)
Called patient and advised her per Dr. Arlyce Dice and Willette Cluster ACNP ordered a MRCP at [facility] for tomorroe 01-02-2012 at 8 AM.  Advised her to be there at 7:45AM and have nothing by mouth after midnight.  The patient agreed to this appointment.

## 2012-01-02 ENCOUNTER — Other Ambulatory Visit: Payer: Self-pay | Admitting: Nurse Practitioner

## 2012-01-02 ENCOUNTER — Ambulatory Visit (HOSPITAL_COMMUNITY)
Admission: RE | Admit: 2012-01-02 | Discharge: 2012-01-02 | Disposition: A | Discharge status: Home / Self Care | Payer: BC Managed Care – PPO | Source: Ambulatory Visit | Attending: Nurse Practitioner | Admitting: Nurse Practitioner

## 2012-01-02 DIAGNOSIS — R112 Nausea with vomiting, unspecified: Secondary | ICD-10-CM | POA: Insufficient documentation

## 2012-01-02 DIAGNOSIS — R1013 Epigastric pain: Secondary | ICD-10-CM | POA: Insufficient documentation

## 2012-01-02 DIAGNOSIS — R109 Unspecified abdominal pain: Secondary | ICD-10-CM

## 2012-01-02 DIAGNOSIS — K7689 Other specified diseases of liver: Secondary | ICD-10-CM | POA: Insufficient documentation

## 2012-01-02 DIAGNOSIS — R7989 Other specified abnormal findings of blood chemistry: Secondary | ICD-10-CM | POA: Insufficient documentation

## 2012-01-02 DIAGNOSIS — R197 Diarrhea, unspecified: Secondary | ICD-10-CM | POA: Insufficient documentation

## 2012-01-02 MED ORDER — GADOBENATE DIMEGLUMINE 529 MG/ML IV SOLN
20.0000 mL | Freq: Once | INTRAVENOUS | Status: AC | PRN
  Administered 2012-01-02: 17 mL via INTRAVENOUS

## 2012-01-03 ENCOUNTER — Other Ambulatory Visit (INDEPENDENT_AMBULATORY_CARE_PROVIDER_SITE_OTHER): Payer: BC Managed Care – PPO

## 2012-01-03 ENCOUNTER — Other Ambulatory Visit: Payer: Self-pay | Admitting: *Deleted

## 2012-01-03 DIAGNOSIS — R7989 Other specified abnormal findings of blood chemistry: Secondary | ICD-10-CM

## 2012-01-03 DIAGNOSIS — R945 Abnormal results of liver function studies: Secondary | ICD-10-CM

## 2012-01-03 DIAGNOSIS — R1013 Epigastric pain: Secondary | ICD-10-CM

## 2012-01-03 LAB — HEPATIC FUNCTION PANEL
ALT: 47 U/L — ABNORMAL HIGH (ref 0–35)
AST: 25 U/L (ref 0–37)
Albumin: 3.6 g/dL (ref 3.5–5.2)
Alkaline Phosphatase: 63 U/L (ref 39–117)
Bilirubin, Direct: 0.1 mg/dL (ref 0.0–0.3)
Total Bilirubin: 0.5 mg/dL (ref 0.3–1.2)
Total Protein: 6.8 g/dL (ref 6.0–8.3)

## 2012-02-21 ENCOUNTER — Other Ambulatory Visit: Payer: Self-pay | Admitting: Family Medicine

## 2012-02-21 MED ORDER — CLONAZEPAM 1 MG PO TABS: 1.0000 mg | ORAL_TABLET | Freq: Two times a day (BID) | ORAL | Status: DC | PRN

## 2012-02-21 NOTE — Telephone Encounter (Signed)
Pt needs new rx clonazepam 1 mg #90. Pt will pick up rx to mail to mail order pharm

## 2012-02-21 NOTE — Telephone Encounter (Signed)
Rx ready for pick up and patient is aware 

## 2012-02-22 ENCOUNTER — Ambulatory Visit: Payer: BC Managed Care – PPO | Admitting: *Deleted

## 2012-03-15 ENCOUNTER — Telehealth: Payer: Self-pay | Admitting: Gastroenterology

## 2012-03-15 ENCOUNTER — Telehealth: Payer: Self-pay | Admitting: *Deleted

## 2012-03-15 NOTE — Telephone Encounter (Signed)
The patient did call me back and I gave her the instructions for her appointment at [facility].  Monday 03-18-2012 .  She is to arrive at 9:45 Am.

## 2012-03-15 NOTE — Telephone Encounter (Signed)
Called the pt and left a message for her that she does have an appointment with Dr. Merri Brunette on Monday 03-18-2012.  The location is [address] [place], [place].  The building has the name: [facility].  I called the home number and it was a fax.

## 2012-05-15 ENCOUNTER — Telehealth: Payer: Self-pay | Admitting: Family Medicine

## 2012-05-15 NOTE — Telephone Encounter (Signed)
pls advise

## 2012-05-15 NOTE — Telephone Encounter (Signed)
Caller: Kyerra/Patient; Phone: [phone]; Reason for Call: Patient is preparing to install beehives on her property.  States she is reluctant to do this without having an EpiPen handy.  States she has never been allergic to bees, nor has anyone in her immediate family, but states she wants an EpiPen "just in case, " and requests Rx for one.  States her beekeeping club advises this.  Declines triage.  Uses [facility]  Info to office for provider review/Rx/callback.  May reach patient at [phone].  Krs/can

## 2012-05-16 MED ORDER — EPINEPHRINE 0.3 MG/0.3ML IJ DEVI: 0.3000 mg | Freq: Once | INTRAMUSCULAR | Status: DC

## 2012-05-16 NOTE — Telephone Encounter (Signed)
Ok to call in

## 2012-05-20 NOTE — Telephone Encounter (Signed)
Yes it has

## 2012-05-20 NOTE — Telephone Encounter (Signed)
Has this been called in?

## 2012-06-20 ENCOUNTER — Telehealth: Payer: Self-pay | Admitting: Nurse Practitioner

## 2012-08-02 ENCOUNTER — Encounter (HOSPITAL_COMMUNITY): Payer: Self-pay | Admitting: Emergency Medicine

## 2012-08-02 ENCOUNTER — Emergency Department (HOSPITAL_COMMUNITY)
Admission: EM | Admit: 2012-08-02 | Discharge: 2012-08-02 | Disposition: A | Discharge status: Home / Self Care | Payer: BC Managed Care – PPO | Attending: Emergency Medicine | Admitting: Emergency Medicine

## 2012-08-02 ENCOUNTER — Emergency Department (HOSPITAL_COMMUNITY): Payer: BC Managed Care – PPO

## 2012-08-02 DIAGNOSIS — S52201A Unspecified fracture of shaft of right ulna, initial encounter for closed fracture: Secondary | ICD-10-CM

## 2012-08-02 DIAGNOSIS — Y929 Unspecified place or not applicable: Secondary | ICD-10-CM | POA: Insufficient documentation

## 2012-08-02 DIAGNOSIS — F411 Generalized anxiety disorder: Secondary | ICD-10-CM | POA: Insufficient documentation

## 2012-08-02 DIAGNOSIS — Z9884 Bariatric surgery status: Secondary | ICD-10-CM | POA: Insufficient documentation

## 2012-08-02 DIAGNOSIS — S52209A Unspecified fracture of shaft of unspecified ulna, initial encounter for closed fracture: Secondary | ICD-10-CM | POA: Insufficient documentation

## 2012-08-02 DIAGNOSIS — W108XXA Fall (on) (from) other stairs and steps, initial encounter: Secondary | ICD-10-CM | POA: Insufficient documentation

## 2012-08-02 DIAGNOSIS — Y998 Other external cause status: Secondary | ICD-10-CM | POA: Insufficient documentation

## 2012-08-02 DIAGNOSIS — Z79899 Other long term (current) drug therapy: Secondary | ICD-10-CM | POA: Insufficient documentation

## 2012-08-02 DIAGNOSIS — Z885 Allergy status to narcotic agent status: Secondary | ICD-10-CM | POA: Insufficient documentation

## 2012-08-02 DIAGNOSIS — Z87891 Personal history of nicotine dependence: Secondary | ICD-10-CM | POA: Insufficient documentation

## 2012-08-02 DIAGNOSIS — E039 Hypothyroidism, unspecified: Secondary | ICD-10-CM | POA: Insufficient documentation

## 2012-08-02 MED ORDER — OXYCODONE-ACETAMINOPHEN 5-325 MG PO TABS
2.0000 | ORAL_TABLET | Freq: Once | ORAL | Status: AC
  Administered 2012-08-02: 2 via ORAL
  Filled 2012-08-02: qty 2

## 2012-08-02 MED ORDER — OXYCODONE-ACETAMINOPHEN 5-325 MG PO TABS: ORAL_TABLET | ORAL | Status: DC

## 2012-08-02 NOTE — ED Notes (Signed)
Per pt she was walking down her stairs and tripped and hit her right medial forearm on wooden railing. Pt tearful and concerned it is broken.

## 2012-08-02 NOTE — ED Provider Notes (Signed)
History    This chart was scribed for a non-physician practitioner, Wynetta Emery, working with Hurman Horn, MD by Frederik Pear, ED Scribe. This patient was seen in room TR07C/TR07C and the patient's care was started at 1919.   CSN: [id number]  Arrival date & time 08/02/12  1909   First MD Initiated Contact with Patient 08/02/12 1919      Chief Complaint  Patient presents with  . Arm Injury    (Consider location/radiation/quality/duration/timing/severity/associated sxs/prior treatment) The history is provided by the patient and medical records. No language interpreter was used.    HPI Comments: Jaime Fuller is a 62 y.o. female who presents to the Emergency Department complaining of a fall that occurred at 1700 after she lost her footing on a loose step while walking down a flight of wooden stairs and hit her right forearm on the wooden railing. She denies hitting her neck or her head. In ED, she complains of severe, constant, non-radiating right forearm pain consistent with where her arm hit the raining. She denies any other associated symptoms including numbness and tingling. She is allergic to Vicodin.   Past Medical History  Diagnosis Date  . Anxiety state, unspecified 09/20/2007  . HYPOTHYROIDISM 03/08/2007  . Pancreatitis     secondary to ERCP    Past Surgical History  Procedure Laterality Date  . Gastric bypass    . Cholecystectomy    . Abdominal plasty    . Childbirth x 2    . Wisdom tooth extraction    . Carpel tunnel surgery left hand      Family History  Problem Relation Age of Onset  . Lung cancer Father   . Heart attack Father     Died at 35    History  Substance Use Topics  . Smoking status: Former Games developer  . Smokeless tobacco: Never Used  . Alcohol Use: No    OB History   Grav Para Term Preterm Abortions TAB SAB Ect Mult Living                  Review of Systems  Constitutional: Negative for fever.  Respiratory: Negative for shortness  of breath.   Cardiovascular: Negative for chest pain.  Gastrointestinal: Negative for nausea, vomiting, abdominal pain and diarrhea.  Musculoskeletal:       Arm injury  All other systems reviewed and are negative.    Allergies  Vicodin  Home Medications   Current Outpatient Rx  Name  Route  Sig  Dispense  Refill  . clonazePAM (KLONOPIN) 0.5 MG tablet   Oral   Take 0.5 mg by mouth 2 (two) times daily as needed for anxiety.         . cyanocobalamin (,VITAMIN B-12,) 1000 MCG/ML injection      INJECT 1 ML (1,000 MCG TOTAL) INTO THE MUSCLE EVERY 30 (THIRTY) DAYS.   30 mL   11   . EPINEPHrine (EPIPEN) 0.3 mg/0.3 mL DEVI   Intramuscular   Inject 0.3 mLs (0.3 mg total) into the muscle once.   2 Device   3     BP 129/77  Pulse 80  Temp(Src) 100.7 F (38.2 C) (Oral)  Resp 18  SpO2 100%  Physical Exam  Nursing note and vitals reviewed. Constitutional: She is oriented to person, place, and time. She appears well-developed and well-nourished. No distress.  HENT:  Head: Normocephalic and atraumatic.  Mouth/Throat: Oropharynx is clear and moist.  Eyes: Conjunctivae and EOM are normal.  Neck:  No midline tenderness to palpation or step-offs appreciated. Patient has full range of motion without pain.   Cardiovascular: Normal rate.   Pulses:      Radial pulses are 2+ on the right side, and 2+ on the left side.  Pulmonary/Chest: Effort normal. No stridor.  Abdominal: Soft.  Musculoskeletal: Normal range of motion. She exhibits tenderness.       Arms: No snuffbox tenderness. Right ulnar forearm tenderness over the distal aspect. Full range of motion to shoulder, elbow and wrist. Neurovascularly intact.    Neurological: She is alert and oriented to person, place, and time. She has normal strength. No sensory deficit.  NV intact.  Psychiatric: She has a normal mood and affect.    ED Course  Procedures (including critical care time)  DIAGNOSTIC STUDIES: Oxygen  Saturation is 100% on room air, normal by my interpretation.    COORDINATION OF CARE:  20:40- Discussed planned course of treatment with the patient, including a sugar tong splint and pain medication, who is agreeable at this time.  Labs Reviewed - No data to display Dg Forearm Right  08/02/2012   *RADIOLOGY REPORT*  Clinical Data: Right forearm pain following a fall.  RIGHT FOREARM - 2 VIEW  Comparison: None.  Findings: Oblique fracture in the distal shaft of the ulna with mild dorsal and radial displacement the distal fragment and mild dorsal angulation of the distal fragment.  The radius appears intact.  IMPRESSION: Distal ulna fracture, as described above.   Original Report Authenticated By: Jaime Fuller, M.D.     1. Ulnar shaft fracture, right, closed, initial encounter       MDM   Filed Vitals:   08/02/12 1921 08/02/12 2054  BP: 129/77 106/72  Pulse: 80 73  Temp: 100.7 F (38.2 C) 100 F (37.8 C)  TempSrc: Oral Oral  Resp: 18 20  SpO2: 100% 95%     Jaime Fuller is a 62 y.o. female with right ulnar forearm pain status post slip and fall. Patient rates her pain as severe. X-ray shows isolated ulnar fracture. Patient placed in sugar tong splint. Orthopedic referral given  Discussed case with attending who agrees with plan and stability to d/c to home.   Medications  oxyCODONE-acetaminophen (PERCOCET/ROXICET) 5-325 MG per tablet 2 tablet (2 tablets Oral Given 08/02/12 2015)    The patient is hemodynamically stable, appropriate for, and amenable to, discharge at this time. Pt verbalized understanding and agrees with care plan. Outpatient follow-up and return precautions given.    Discharge Medication List as of 08/02/2012  8:48 PM    START taking these medications   Details  oxyCODONE-acetaminophen (PERCOCET/ROXICET) 5-325 MG per tablet 1 to 2 tabs PO q6hrs  PRN for pain, Print               Wynetta Emery, PA-C 08/04/12 1841

## 2012-08-02 NOTE — Progress Notes (Signed)
Orthopedic Tech Progress Note Patient Details:  Jaime Fuller 1950-03-28 [id number]  Ortho Devices Type of Ortho Device: Ace wrap;Arm sling;Sugartong splint Ortho Device/Splint Location: RUE Ortho Device/Splint Interventions: Ordered;Application   [name] 08/02/2012, 9:08 PM

## 2012-08-05 NOTE — ED Provider Notes (Signed)
Medical screening examination/treatment/procedure(s) were performed by non-physician practitioner and as supervising physician I was immediately available for consultation/collaboration.   Hurman Horn, MD 08/05/12 1723

## 2012-09-16 NOTE — Telephone Encounter (Signed)
Opened in error

## 2012-11-09 ENCOUNTER — Emergency Department (INDEPENDENT_AMBULATORY_CARE_PROVIDER_SITE_OTHER)
Admission: EM | Admit: 2012-11-09 | Discharge: 2012-11-09 | Disposition: A | Discharge status: Home / Self Care | Payer: BC Managed Care – PPO | Source: Home / Self Care | Attending: Family Medicine | Admitting: Family Medicine

## 2012-11-09 ENCOUNTER — Encounter (HOSPITAL_COMMUNITY): Payer: Self-pay | Admitting: Emergency Medicine

## 2012-11-09 DIAGNOSIS — B001 Herpesviral vesicular dermatitis: Secondary | ICD-10-CM

## 2012-11-09 DIAGNOSIS — B009 Herpesviral infection, unspecified: Secondary | ICD-10-CM

## 2012-11-09 MED ORDER — VALACYCLOVIR HCL 1 G PO TABS: ORAL_TABLET | ORAL | Status: DC

## 2012-11-09 NOTE — ED Notes (Signed)
C/o ulcers in and on mouth since Tuesday.  Patient states her daughter did her makeup and patient already had an ulcer on her mouth and once daughter did makeup and spread the infection

## 2012-11-12 ENCOUNTER — Telehealth: Payer: Self-pay | Admitting: Family Medicine

## 2012-11-12 NOTE — Telephone Encounter (Signed)
[facility] Triage Call Report Triage Record Num: [id number] Operator: Geanie Berlin Patient Name: Jaime Fuller Call Date & Time: 11/08/2012 5:12:42PM Patient Phone: [phone] PCP: Eugenio Hoes. Todd Patient Gender: Female PCP Fax : [phone] Patient DOB: [birth year] [facility] Name: [facility] Reason for Call: Caller: Jaime Fuller/Patient; PCP: Kelle Darting (Family Practice); CB#: [phone]; Call regarding Lip ulcers; Onset: 11/08/12. Afebrile. Menopausal. Had repeated permandent lip color applied (lip tatoo) 11/06/12. Has one ulcer before permanent lip color applied. Using lavender ointment to keep it moist. Reports muliptle papular sores on lips where punctured for tatoo," like little pimples" around lips; Lips are minimally puffy. Asking what to do to keep it from getting worse. Pain rated 2/10. Advised to see [facility] now per nursing judgement for mulitple lesions or ulcers related to new oral piercing per Mouth Injury. Protocol(s) Used: Mouth Injury Protocol(s) Used: Mouth Lesions Recommended Outcome per Protocol: Provide Home/Self Care Reason for Outcome: Any mouth, lip, or tooth injury New oral piercing (tongue, lip, or cheek) Care Advice: ~ Call provider if symptoms continue, worsen, or new symptoms develop. ~ Avoid eating spicy, salty, acidic, or hot temperature foods or liquids for a few days. ~ See a provider if you have continued bleeding or severe pain or swelling. 08/

## 2012-11-13 NOTE — ED Provider Notes (Signed)
CSN: [id number]     Arrival date & time 11/09/12  1043 History   First MD Initiated Contact with Patient 11/09/12 1214     Chief Complaint  Patient presents with  . Mouth Lesions    Patient is a 62 y.o. female presenting with mouth sores. The history is provided by the patient.  Mouth Lesions Location:  Upper lip and lower lip Upper lip location:  L outer and R outer Lower lip location:  L outer and R outer Quality:  Painful and blistered Pain details:    Quality:  Burning and sore   Severity:  Moderate   Duration:  5 days   Timing:  Constant   Progression:  Worsening Onset quality:  Sudden Severity:  Moderate Duration:  5 days Progression:  Worsening Chronicity:  New Relieved by:  Topical medications Associated symptoms: no fever, no sore throat and no swollen glands   Pt reports that on Tuesday she had a small "ulcer" inside of her mouth to the inner (R) aspect. She allowed her daughter to apply (permanent lip color) which is applied with a small needle. By Tuesday night she noted multiple, painful blisters coming out on both her top and bottom outer lips. The blisters are painful and have worsened since onset. She has rec'd some relief w/ topical oral anesthetic OTC meds but pain persist. Denies fever or other associated symptoms.  Past Medical History  Diagnosis Date  . Anxiety state, unspecified 09/20/2007  . HYPOTHYROIDISM 03/08/2007  . Pancreatitis     secondary to ERCP   Past Surgical History  Procedure Laterality Date  . Gastric bypass    . Cholecystectomy    . Abdominal plasty    . Childbirth x 2    . Wisdom tooth extraction    . Carpel tunnel surgery left hand     Family History  Problem Relation Age of Onset  . Lung cancer Father   . Heart attack Father     Died at 52   History  Substance Use Topics  . Smoking status: Former Games developer  . Smokeless tobacco: Never Used  . Alcohol Use: No   OB History   Grav Para Term Preterm Abortions TAB SAB Ect Mult  Living                 Review of Systems  Constitutional: Negative for fever.  HENT: Positive for mouth sores. Negative for sore throat.   All other systems reviewed and are negative.    Allergies  Vicodin  Home Medications   Current Outpatient Rx  Name  Route  Sig  Dispense  Refill  . cyanocobalamin (,VITAMIN B-12,) 1000 MCG/ML injection      INJECT 1 ML (1,000 MCG TOTAL) INTO THE MUSCLE EVERY 30 (THIRTY) DAYS.   30 mL   11   . clonazePAM (KLONOPIN) 0.5 MG tablet   Oral   Take 0.5 mg by mouth 2 (two) times daily as needed for anxiety.         Marland Kitchen EPINEPHrine (EPIPEN) 0.3 mg/0.3 mL DEVI   Intramuscular   Inject 0.3 mLs (0.3 mg total) into the muscle once.   2 Device   3   . oxyCODONE-acetaminophen (PERCOCET/ROXICET) 5-325 MG per tablet      1 to 2 tabs PO q6hrs  PRN for pain   15 tablet   0   . valACYclovir (VALTREX) 1000 MG tablet      Take 2 tabs every 12 hours for 1 day.  Take at the earliest onset of symptoms.   4 tablet   2    BP 112/73  Pulse 73  Temp(Src) 100.1 F (37.8 C) (Oral)  Resp 18  SpO2 98% Physical Exam  Constitutional: She is oriented to person, place, and time. She appears well-developed and well-nourished.  HENT:  Head: Normocephalic and atraumatic.  Eyes: Conjunctivae are normal.  Neck: Neck supple.  Cardiovascular: Normal rate.   Pulmonary/Chest: Effort normal.  Musculoskeletal: Normal range of motion.  Neurological: She is alert and oriented to person, place, and time.  Skin: Skin is warm and dry.  Multiple vesicular type raised lesions to outer rim of both upper and lower lips c/w herpes labialis. Minimal swelling.  Psychiatric: She has a normal mood and affect.    ED Course  Procedures (including critical care time) Labs Review Labs Reviewed - No data to display Imaging Review No results found.  MDM   1. Herpes labialis    HPI and PE c/w herpes labialis. Will treat w/ valacyclovir and recommend OTC Abreva topical  cream for added comfort and benefit. Pt informed Valacyclovir may not be highly beneficial this late in onset but encouragde to take just the same. To f/u w/ PCP if not improving over the next week.    Leanne Chang, NP 11/13/12 [phone]

## 2012-11-13 NOTE — ED Provider Notes (Signed)
Medical screening examination/treatment/procedure(s) were performed by resident physician or non-physician practitioner and as supervising physician I was immediately available for consultation/collaboration.   Diarra Ceja DOUGLAS MD.   Monserratt Knezevic D Jatavia Keltner, MD 11/13/12 2143 

## 2013-04-03 ENCOUNTER — Ambulatory Visit (INDEPENDENT_AMBULATORY_CARE_PROVIDER_SITE_OTHER): Payer: BC Managed Care – PPO

## 2013-04-03 ENCOUNTER — Telehealth: Payer: Self-pay | Admitting: Family Medicine

## 2013-04-03 DIAGNOSIS — Z23 Encounter for immunization: Secondary | ICD-10-CM

## 2013-04-03 NOTE — Telephone Encounter (Signed)
Pt has had reoccurring fever blisters. Went to [facility] and RX valACYclovir (VALTREX) 1000 MG tablet  Pt would like to know if dr todd would write her an rx w/ refills for this med  Pt has had 3 outbreaks in a month and the valtrex helps Pt aware dr todd out this week, but pt going out of state on Sat and would prefer not to be w/out this med. Pharm [facility] [address]

## 2013-04-04 MED ORDER — VALACYCLOVIR HCL 1 G PO TABS: ORAL_TABLET | ORAL | Status: DC

## 2013-04-04 NOTE — Telephone Encounter (Signed)
Okay #20  RF 4 One twice a day for 5 days as needed for outbreak

## 2013-04-04 NOTE — Telephone Encounter (Signed)
Pt notified Rx sent to pharmacy

## 2013-04-04 NOTE — Telephone Encounter (Signed)
Please advise 

## 2014-02-03 ENCOUNTER — Ambulatory Visit (INDEPENDENT_AMBULATORY_CARE_PROVIDER_SITE_OTHER): Payer: BC Managed Care – PPO | Admitting: Family Medicine

## 2014-02-03 ENCOUNTER — Encounter: Payer: Self-pay | Admitting: Family Medicine

## 2014-02-03 VITALS — BP 120/88 | Temp 98.5°F | Wt 178.0 lb

## 2014-02-03 DIAGNOSIS — J4520 Mild intermittent asthma, uncomplicated: Secondary | ICD-10-CM

## 2014-02-03 DIAGNOSIS — J45909 Unspecified asthma, uncomplicated: Secondary | ICD-10-CM | POA: Insufficient documentation

## 2014-02-03 DIAGNOSIS — Z23 Encounter for immunization: Secondary | ICD-10-CM

## 2014-02-03 MED ORDER — BENZONATATE 100 MG PO CAPS: 100.0000 mg | ORAL_CAPSULE | Freq: Two times a day (BID) | ORAL | Status: DC | PRN

## 2014-02-03 MED ORDER — NALOXONE HCL 1 MG/ML IJ SOLN: 1.0000 mg | INTRAMUSCULAR | Status: DC | PRN

## 2014-02-03 MED ORDER — EPINEPHRINE 0.15 MG/0.3ML IJ SOAJ
0.1500 mg | INTRAMUSCULAR | Status: DC | PRN
Start: 2014-02-03 — End: 2016-10-26

## 2014-02-03 MED ORDER — PREDNISONE 20 MG PO TABS: ORAL_TABLET | ORAL | Status: DC

## 2014-02-03 NOTE — Progress Notes (Signed)
   Subjective:    Patient ID: EDWARD TREVINO, female    DOB: 07-13-1950, 63 y.o.   MRN: [id number]  HPI Kaeleen is a 63 year old married female nonsmoker who comes in today for evaluation of cough since October 14  She remembers that day specifically because that's today she put her daughter in a rehabilitation. Claiborne Billings has a history of narcotic abuse. She left ear because we would not give her any more narcotics. She went to another doctor finally when she couldn't get any more narcotics from prescription she began using heroin on the street. She was admitted to rehabilitation on the 14th and Pam just picked her up today. I explained to Pam this is a most vulnerable time when they get out a rehabilitation because they can easily overdose and die. I will give Pam a prescription for Narcan to use for [facility] hopefully she won't need it  The cough this persisted for Pam for the last 6 weeks. No fever no sputum production.   Review of Systems    review of systems negative no history of any pulmonary disease Objective:   Physical Exam  Well-developed well-nourished female no acute distress vital signs stable she's afebrile HEENT were negative neck was supple no adenopathy lungs are clear      Assessment & Plan:  Reactive airway disease plan prednisone burst and taper

## 2014-02-03 NOTE — Progress Notes (Signed)
Pre visit review using our clinic review tool, if applicable. No additional management support is needed unless otherwise documented below in the visit note. 

## 2014-02-03 NOTE — Patient Instructions (Signed)
.....   Prednisone 20 mg........... 2 tabs 3 days or until you feel a lot better then taper as outlined  Tessalon....... 4 times daily for cough  Return when necessary

## 2014-02-18 ENCOUNTER — Encounter: Payer: Self-pay | Admitting: Family Medicine

## 2014-02-18 ENCOUNTER — Ambulatory Visit (INDEPENDENT_AMBULATORY_CARE_PROVIDER_SITE_OTHER): Payer: BC Managed Care – PPO | Admitting: Family Medicine

## 2014-02-18 VITALS — BP 110/78 | Temp 99.4°F | Wt 176.0 lb

## 2014-02-18 DIAGNOSIS — J4520 Mild intermittent asthma, uncomplicated: Secondary | ICD-10-CM

## 2014-02-18 MED ORDER — DOXYCYCLINE HYCLATE 100 MG PO TABS: 100.0000 mg | ORAL_TABLET | Freq: Two times a day (BID) | ORAL | Status: DC

## 2014-02-18 MED ORDER — HYDROCODONE-HOMATROPINE 5-1.5 MG/5ML PO SYRP: 5.0000 mL | ORAL_SOLUTION | Freq: Three times a day (TID) | ORAL | Status: DC | PRN

## 2014-02-18 MED ORDER — MONTELUKAST SODIUM 10 MG PO TABS: 10.0000 mg | ORAL_TABLET | Freq: Every day | ORAL | Status: DC

## 2014-02-18 MED ORDER — PREDNISONE 20 MG PO TABS: ORAL_TABLET | ORAL | Status: DC

## 2014-02-18 NOTE — Progress Notes (Signed)
   Subjective:    Patient ID: Jaime Fuller, female    DOB: 10/27/1950, 63 y.o.   MRN: [id number]  HPI Jaime Fuller is a 63 year old female who comes in today with persistence of her reactive airway disease  We saw her 2 weeks ago with a viral syndrome and secondary reactive airway disease. We shot a short course of prednisone with a quick taper. When she was on 40 mg of prednisone daily her cough markedly diminished however she's taper the prednisone the cough is return. No fever no sputum production   Review of Systems    review of systems otherwise negative Objective:   Physical Exam  Well-developed well-nourished female no acute distress vital signs stable she's afebrile HEENT were negative neck was supple no adenopathy lungs are clear except for some mild late expiratory symmetrical wheezing      Assessment & Plan:  Viral syndrome,,,,,, reactive airway disease,,,,,,,,,,,,,,, restart prednisone, add Singulair, Hydromet,,

## 2014-02-18 NOTE — Patient Instructions (Signed)
Prednisone 20 mg........ 2 tablets now.. 2 tabs at bedtime tonight..... Then starting tomorrow morning 2 tabs daily in the morning  Drink lots of water  Singulair 10 mg.... One at bedtime  Doxycycline 100 mg....... one twice daily  Return on Monday for follow-up  Hydromet 1/2 teaspoon 4 times daily

## 2014-02-18 NOTE — Progress Notes (Signed)
Pre visit review using our clinic review tool, if applicable. No additional management support is needed unless otherwise documented below in the visit note. 

## 2014-02-21 ENCOUNTER — Encounter (HOSPITAL_COMMUNITY): Payer: Self-pay | Admitting: Emergency Medicine

## 2014-02-21 ENCOUNTER — Emergency Department (HOSPITAL_COMMUNITY)
Admission: EM | Admit: 2014-02-21 | Discharge: 2014-02-21 | Disposition: A | Discharge status: Home / Self Care | Payer: BC Managed Care – PPO | Attending: Emergency Medicine | Admitting: Emergency Medicine

## 2014-02-21 ENCOUNTER — Emergency Department (HOSPITAL_COMMUNITY): Payer: BC Managed Care – PPO

## 2014-02-21 DIAGNOSIS — Z8719 Personal history of other diseases of the digestive system: Secondary | ICD-10-CM | POA: Diagnosis not present

## 2014-02-21 DIAGNOSIS — R059 Cough, unspecified: Secondary | ICD-10-CM

## 2014-02-21 DIAGNOSIS — Z792 Long term (current) use of antibiotics: Secondary | ICD-10-CM | POA: Diagnosis not present

## 2014-02-21 DIAGNOSIS — B349 Viral infection, unspecified: Secondary | ICD-10-CM | POA: Diagnosis not present

## 2014-02-21 DIAGNOSIS — Z79899 Other long term (current) drug therapy: Secondary | ICD-10-CM | POA: Insufficient documentation

## 2014-02-21 DIAGNOSIS — Z8639 Personal history of other endocrine, nutritional and metabolic disease: Secondary | ICD-10-CM | POA: Insufficient documentation

## 2014-02-21 DIAGNOSIS — F419 Anxiety disorder, unspecified: Secondary | ICD-10-CM | POA: Diagnosis not present

## 2014-02-21 DIAGNOSIS — Z87891 Personal history of nicotine dependence: Secondary | ICD-10-CM | POA: Insufficient documentation

## 2014-02-21 DIAGNOSIS — R05 Cough: Secondary | ICD-10-CM

## 2014-02-21 NOTE — ED Notes (Signed)
Pt seen at PMD 3 weeks ago and given prednisone for URI. Pt followed up with PMD recently and told she still had URI and given antibiotics and another dose of prednisone. Pt with dry cough.

## 2014-02-21 NOTE — ED Provider Notes (Signed)
CSN: [id number]     Arrival date & time 02/21/14  0815 History   First MD Initiated Contact with Patient 02/21/14 [phone]     Chief Complaint  Patient presents with  . Cough     (Consider location/radiation/quality/duration/timing/severity/associated sxs/prior Treatment) HPI Comments: Pt comes in with cc of cough. Pt has been having some uri like sx since October. Pt's PCP has treated her with bronchodilators and steroids. PT's sx not improving, so she was started on doxy. Overtime, pt's cough, now clear - tinged with blood, has worsened. Also there is "rattling" sounds in her lungs at night. No fevers. No hx of PE, DVT and no risk factors for the same. Pt has no cardiac hx, no orthopnea, PND like sx and no exertional dyspnea.  Patient is a 63 y.o. female presenting with cough. The history is provided by the patient.  Cough Associated symptoms: wheezing   Associated symptoms: no chest pain, no headaches and no shortness of breath     Past Medical History  Diagnosis Date  . Anxiety state, unspecified 09/20/2007  . HYPOTHYROIDISM 03/08/2007  . Pancreatitis     secondary to ERCP   Past Surgical History  Procedure Laterality Date  . Gastric bypass    . Cholecystectomy    . Abdominal plasty    . Childbirth x 2    . Wisdom tooth extraction    . Carpel tunnel surgery left hand     Family History  Problem Relation Age of Onset  . Lung cancer Father   . Heart attack Father     Died at 45   History  Substance Use Topics  . Smoking status: Former Research scientist (life sciences)  . Smokeless tobacco: Never Used  . Alcohol Use: No   OB History    No data available     Review of Systems  Constitutional: Positive for activity change.  Respiratory: Positive for cough and wheezing. Negative for shortness of breath.   Cardiovascular: Negative for chest pain.  Gastrointestinal: Negative for nausea, vomiting and abdominal pain.  Genitourinary: Negative for dysuria.  Musculoskeletal: Negative for neck pain.   Neurological: Negative for headaches.      Allergies  Vicodin  Home Medications   Prior to Admission medications   Medication Sig Start Date End Date Taking? Authorizing Provider  clonazePAM (KLONOPIN) 0.5 MG tablet Take 0.5 mg by mouth 2 (two) times daily as needed for anxiety.   Yes Historical Provider, MD  cyanocobalamin (,VITAMIN B-12,) 1000 MCG/ML injection INJECT 1 ML (1,000 MCG TOTAL) INTO THE MUSCLE EVERY 30 (THIRTY) DAYS. 02/21/12  Yes Dorena Cookey, MD  doxycycline (VIBRA-TABS) 100 MG tablet Take 1 tablet (100 mg total) by mouth 2 (two) times daily. 02/18/14  Yes Dorena Cookey, MD  EPINEPHrine (EPIPEN JR) 0.15 MG/0.3ML injection Inject 0.3 mLs (0.15 mg total) into the muscle as needed for anaphylaxis. 02/03/14  Yes Dorena Cookey, MD  HYDROcodone-homatropine [facility]) 5-1.5 MG/5ML syrup Take 5 mLs by mouth every 8 (eight) hours as needed. 02/18/14  Yes Dorena Cookey, MD  montelukast (SINGULAIR) 10 MG tablet Take 1 tablet (10 mg total) by mouth at bedtime. 02/18/14  Yes Dorena Cookey, MD  predniSONE (DELTASONE) 20 MG tablet 2 tabs x 3 days, 1 tab x 3 days, 1/2 tab x 3 days, 1/2 tab M,W,F x 2 weeks 02/18/14  Yes Dorena Cookey, MD  valACYclovir (VALTREX) 1000 MG tablet One tablet twice a day x 5 days as needed for outbreak 04/04/13  Yes Marletta Lor, MD  naloxone [facility]) 1 MG/ML injection Inject 1 mL (1 mg total) into the vein as needed. Patient not taking: Reported on 02/21/2014 02/03/14   Dorena Cookey, MD   BP 103/71 mmHg  Pulse 67  Temp(Src) 98.5 F (36.9 C) (Oral)  Resp 20  Ht 5\' 3"  (1.6 m)  Wt 176 lb (79.833 kg)  BMI 31.18 kg/m2  SpO2 100% Physical Exam  Constitutional: She is oriented to person, place, and time. She appears well-developed and well-nourished.  HENT:  Head: Normocephalic and atraumatic.  Eyes: EOM are normal. Pupils are equal, round, and reactive to light.  Neck: Neck supple.  Cardiovascular: Normal rate, regular rhythm and normal heart  sounds.   No murmur heard. Pulmonary/Chest: Effort normal. No respiratory distress. She has wheezes.  Abdominal: Soft. She exhibits no distension. There is no tenderness. There is no rebound and no guarding.  Neurological: She is alert and oriented to person, place, and time.  Skin: Skin is warm and dry.  Nursing note and vitals reviewed.   ED Course  Procedures (including critical care time) Labs Review Labs Reviewed - No data to display  Imaging Review Dg Chest 2 View  02/21/2014   CLINICAL DATA:  63 year old female with 2-3 month history of productive cough, upper respiratory symptoms and recent development of wheezing yesterday. She has been on antibiotics the past 4 days.  EXAM: CHEST  2 VIEW  COMPARISON:  None.  FINDINGS: The lungs are clear and negative for focal airspace consolidation, pulmonary edema or suspicious pulmonary nodule. No pleural effusion or pneumothorax. Cardiac and mediastinal contours are within normal limits. No acute fracture or lytic or blastic osseous lesions. The visualized upper abdominal bowel gas pattern is unremarkable. Surgical clips noted in the right upper quadrant and the region of the gastroesophageal junction.  IMPRESSION: Negative chest x-ray.   Electronically Signed   By: Jacqulynn Cadet M.D.   On: 02/21/2014 09:56     EKG Interpretation None      MDM   Final diagnoses:  Cough  Viral syndrome   Pt comes in with cough.  Productive, no fevers, lungs are clear. Pt has blood tinged mucus. Appears to be bronchitis clinically, however, her sx present for several days now. No concerning constitutionals for cancer. No clinical concerns for PE. Will get CXR.  10:53 AM Pt's Xrays are clear. Advised continued PCP f/u for optimal workup and possible need for specialist.  Varney Biles, MD 02/21/14 1057

## 2014-02-21 NOTE — Discharge Instructions (Signed)
We saw you in the ER for the persistent cough. We think what you have is a viral syndrome or viral bronchitis- the treatment for which is symptomatic relief only, and your body will fight the infection off in a few days.  See your primary care doctor in 1 week if the symptoms dont improve.   Viral Infections A viral infection can be caused by different types of viruses.Most viral infections are not serious and resolve on their own. However, some infections may cause severe symptoms and may lead to further complications. SYMPTOMS Viruses can frequently cause:  Minor sore throat.  Aches and pains.  Headaches.  Runny nose.  Different types of rashes.  Watery eyes.  Tiredness.  Cough.  Loss of appetite.  Gastrointestinal infections, resulting in nausea, vomiting, and diarrhea. These symptoms do not respond to antibiotics because the infection is not caused by bacteria. However, you might catch a bacterial infection following the viral infection. This is sometimes called a "superinfection." Symptoms of such a bacterial infection may include:  Worsening sore throat with pus and difficulty swallowing.  Swollen neck glands.  Chills and a high or persistent fever.  Severe headache.  Tenderness over the sinuses.  Persistent overall ill feeling (malaise), muscle aches, and tiredness (fatigue).  Persistent cough.  Yellow, green, or brown mucus production with coughing. HOME CARE INSTRUCTIONS   Only take over-the-counter or prescription medicines for pain, discomfort, diarrhea, or fever as directed by your caregiver.  Drink enough water and fluids to keep your urine clear or pale yellow. Sports drinks can provide valuable electrolytes, sugars, and hydration.  Get plenty of rest and maintain proper nutrition. Soups and broths with crackers or rice are fine. SEEK IMMEDIATE MEDICAL CARE IF:   You have severe headaches, shortness of breath, chest pain, neck pain, or an unusual  rash.  You have uncontrolled vomiting, diarrhea, or you are unable to keep down fluids.  You or your child has an oral temperature above 102 F (38.9 C), not controlled by medicine.  Your baby is older than 3 months with a rectal temperature of 102 F (38.9 C) or higher.  Your baby is 46 months old or younger with a rectal temperature of 100.4 F (38 C) or higher. MAKE SURE YOU:   Understand these instructions.  Will watch your condition.  Will get help right away if you are not doing well or get worse. Document Released: 12/07/2004 Document Revised: 05/22/2011 Document Reviewed: 07/04/2010 [facility] Patient Information 2015 [place], [place]. This information is not intended to replace advice given to you by your health care provider. Make sure you discuss any questions you have with your health care provider. Acute Bronchitis Bronchitis is inflammation of the airways that extend from the windpipe into the lungs (bronchi). The inflammation often causes mucus to develop. This leads to a cough, which is the most common symptom of bronchitis.  In acute bronchitis, the condition usually develops suddenly and goes away over time, usually in a couple weeks. Smoking, allergies, and asthma can make bronchitis worse. Repeated episodes of bronchitis may cause further lung problems.  CAUSES Acute bronchitis is most often caused by the same virus that causes a cold. The virus can spread from person to person (contagious) through coughing, sneezing, and touching contaminated objects. SIGNS AND SYMPTOMS   Cough.   Fever.   Coughing up mucus.   Body aches.   Chest congestion.   Chills.   Shortness of breath.   Sore throat.  DIAGNOSIS  Acute bronchitis is usually diagnosed through a physical exam. Your health care provider will also ask you questions about your medical history. Tests, such as chest X-rays, are sometimes done to rule out other conditions.  TREATMENT  Acute  bronchitis usually goes away in a couple weeks. Oftentimes, no medical treatment is necessary. Medicines are sometimes given for relief of fever or cough. Antibiotic medicines are usually not needed but may be prescribed in certain situations. In some cases, an inhaler may be recommended to help reduce shortness of breath and control the cough. A cool mist vaporizer may also be used to help thin bronchial secretions and make it easier to clear the chest.  HOME CARE INSTRUCTIONS  Get plenty of rest.   Drink enough fluids to keep your urine clear or pale yellow (unless you have a medical condition that requires fluid restriction). Increasing fluids may help thin your respiratory secretions (sputum) and reduce chest congestion, and it will prevent dehydration.   Take medicines only as directed by your health care provider.  If you were prescribed an antibiotic medicine, finish it all even if you start to feel better.  Avoid smoking and secondhand smoke. Exposure to cigarette smoke or irritating chemicals will make bronchitis worse. If you are a smoker, consider using nicotine gum or skin patches to help control withdrawal symptoms. Quitting smoking will help your lungs heal faster.   Reduce the chances of another bout of acute bronchitis by washing your hands frequently, avoiding people with cold symptoms, and trying not to touch your hands to your mouth, nose, or eyes.   Keep all follow-up visits as directed by your health care provider.  SEEK MEDICAL CARE IF: Your symptoms do not improve after 1 week of treatment.  SEEK IMMEDIATE MEDICAL CARE IF:  You develop an increased fever or chills.   You have chest pain.   You have severe shortness of breath.  You have bloody sputum.   You develop dehydration.  You faint or repeatedly feel like you are going to pass out.  You develop repeated vomiting.  You develop a severe headache. MAKE SURE YOU:   Understand these  instructions.  Will watch your condition.  Will get help right away if you are not doing well or get worse. Document Released: 04/06/2004 Document Revised: 07/14/2013 Document Reviewed: 08/20/2012 [facility] Patient Information 2015 [place], [place]. This information is not intended to replace advice given to you by your health care provider. Make sure you discuss any questions you have with your health care provider.

## 2014-02-23 ENCOUNTER — Telehealth: Payer: Self-pay

## 2014-02-23 ENCOUNTER — Ambulatory Visit: Payer: BC Managed Care – PPO | Admitting: Family Medicine

## 2014-02-23 NOTE — Telephone Encounter (Signed)
Spoke with patient and she is doing a little better today.  She believes it is virus.  She has an appointment on Wednesday and will call and cancel if she is not better.

## 2014-02-23 NOTE — Telephone Encounter (Signed)
[facility] Night - Client TELEPHONE [facility] Patient Name: Jaime Fuller Gender: Female DOB: 06/20/1950 Age: 63 Y 3 M 9 D Return Phone Number: [phone] (Primary) Address: [place]/State/Zip: [place] [address] Client [facility] Night - Client Client Site [facility] - Night Physician Todd, [place] Type Call Call Type Triage / Clinical Caller Name [place] Relationship To Patient Spouse Return Phone Number [phone] (Primary) Chief Complaint BREATHING - fast, heavy or wheezing Initial Comment caller states wife has cough and wheezing PreDisposition Did not know what to do Nurse Assessment Nurse: Leilani Merl, RN, Heather Date/Time (Eastern Time): 02/21/2014 7:23:23 AM Confirm and document reason for call. If symptomatic, describe symptoms. ---Caller states that she was diagnosed with a URI 3 weeks ago and she was still having problems and went back to the doctor on Wednesday and she was put on steroids and antibiotic and cough med. She started rattling in her chest yesterday, she was coughing so hard this morning that she was coughing up blood. Has the patient traveled out of the country within the last 30 days? ---Not Applicable Does the patient require triage? ---Yes Related visit to physician within the last 2 weeks? ---Yes Does the PT have any chronic conditions? (i.e. diabetes, asthma, etc.) ---No Guidelines Guideline Title Affirmed Question Affirmed Notes Nurse Date/Time Eilene Ghazi Time) Cough - Acute Productive Chest pain (Exception: MILD central chest pain, present only when coughing) Standifer, RN, Heather 02/21/2014 7:25:39 AM Disp. Time Eilene Ghazi Time) Disposition Final User 02/21/2014 7:19:39 AM Send to Urgent Queue [facility] 02/21/2014 7:28:50 AM Call Completed Standifer, RN, Nira Conn 02/21/2014 7:27:50 AM Go to ED Now Yes Standifer, RN,  Soyla Murphy Understands: Yes Disagree/Comply: Comply PLEASE NOTE: All timestamps contained within this report are represented as [place] Standard Time. CONFIDENTIALTY NOTICE: This fax transmission is intended only for the addressee. It contains information that is legally privileged, confidential or otherwise protected from use or disclosure. If you are not the intended recipient, you are strictly prohibited from reviewing, disclosing, copying using or disseminating any of this information or taking any action in reliance on or regarding this information. If you have received this fax in error, please notify us immediately by telephone so that we can arrange for its return to [place]. Phone: [phone], Toll-Free: [phone], Fax: [phone] Page: 2 of 2 Call Id: [id number] Care Advice Given Per Guideline GO TO ED NOW: You need to be seen in the Emergency Department. Go to the ER at ___________ [place] now. Drive carefully. DRIVING: Another adult should drive. CARE ADVICE given per Cough - Acute Productive (Adult) guideline. After Care Instructions Given Call Event Type User Date / Time Description Referrals [facility] - ED

## 2014-02-25 ENCOUNTER — Ambulatory Visit: Payer: BC Managed Care – PPO | Admitting: Family Medicine

## 2014-04-11 ENCOUNTER — Ambulatory Visit (INDEPENDENT_AMBULATORY_CARE_PROVIDER_SITE_OTHER): Payer: BLUE CROSS/BLUE SHIELD | Admitting: Family Medicine

## 2014-04-11 ENCOUNTER — Encounter: Payer: Self-pay | Admitting: Family Medicine

## 2014-04-11 VITALS — BP 110/68 | HR 83 | Temp 97.9°F | Wt 173.0 lb

## 2014-04-11 DIAGNOSIS — R509 Fever, unspecified: Secondary | ICD-10-CM

## 2014-04-11 DIAGNOSIS — R8290 Unspecified abnormal findings in urine: Secondary | ICD-10-CM

## 2014-04-11 DIAGNOSIS — J111 Influenza due to unidentified influenza virus with other respiratory manifestations: Secondary | ICD-10-CM | POA: Insufficient documentation

## 2014-04-11 LAB — POCT URINALYSIS DIPSTICK
Bilirubin, UA: NEGATIVE
Glucose, UA: NEGATIVE
Ketones, UA: NEGATIVE
Leukocytes, UA: NEGATIVE
Spec Grav, UA: 1.02
Urobilinogen, UA: NEGATIVE
pH, UA: 5

## 2014-04-11 LAB — POCT INFLUENZA A/B
Influenza A, POC: POSITIVE
Influenza B, POC: POSITIVE

## 2014-04-11 NOTE — Assessment & Plan Note (Addendum)
sxs most consistent with influenza - +sick contacts at home, high fevers with myalgias and diarrhea. Flu swab today positive Ongoing symptoms 7 days, fever broke last night and seems to be improving as of today. Discussed tamiflu, recommend against this.  Discussed red flags to seek urgent care again - worsening sxs, increasing fever, worsening productive cough. Abnormal UA today - anticipate due to fever, flu. Suggested recheck once feeling better as no urinary sxs today. Pt/daughter agree with plan.

## 2014-04-11 NOTE — Progress Notes (Signed)
BP 110/68 mmHg  Pulse 83  Temp(Src) 97.9 F (36.6 C)  Wt 173 lb (78.472 kg)   CC: fever  Subjective:    Patient ID: Jaime Fuller, female    DOB: 06-17-1950, 64 y.o.   MRN: [id number]  HPI: Jaime Fuller is a 64 y.o. female presenting on 04/11/2014 for fever for 5 days   Pleasant patient of Dr Honor Junes with history of RAD, hypothyroidism, fatigue and anxiety on clonazepam presents with daughter with 1 wk h/o high fever Tmax 103, cold chills with muscle and back pain for 3 days. 4d ago started feeling better, then again sxs deteriorated with chills and myalgias. Headache intermittently as well as neck pain. + watery diarrhea with nausea. Some R sided abd pain worse when fever increases with radiation to R lower back pain.   Denies cough, congestion, sore throat, ear or tooth pain, vomiting, or constipation, urinary symptoms (dysuria, urgency, frequency, hematuria).   + multiple sick contacts at home with "flu" without formal diagnosis.  Jaime Fuller recently in hospital with knee infection in his knee replacement s/p surgery.  Treating with alternating ibuprofen and tylenol.  H/o gastric bypass.  She did receive flu shot.  No known h/o diverticulitis or -osis.  Treated 02/03/2014 for viral syndrome with RAD flare with prednisone taper, then again 02/18/2014 with recurrent cough, started on prednisone, singulair and hydromet and doxycycline course. Cough progressively worsened and she was seen at ER 02/21/2014 with dx persistent cough and viral bronchitis with normal CXR.   Relevant past medical, surgical, family and social history reviewed and updated as indicated. Interim medical history since our last visit reviewed. Allergies and medications reviewed and updated. Current Outpatient Prescriptions on File Prior to Visit  Medication Sig  . clonazePAM (KLONOPIN) 0.5 MG tablet Take 0.5 mg by mouth 2 (two) times daily as needed for anxiety.  . cyanocobalamin (,VITAMIN B-12,) 1000 MCG/ML  injection INJECT 1 ML (1,000 MCG TOTAL) INTO THE MUSCLE EVERY 30 (THIRTY) DAYS.  Marland Kitchen montelukast (SINGULAIR) 10 MG tablet Take 1 tablet (10 mg total) by mouth at bedtime.  . naloxone (NARCAN) 1 MG/ML injection Inject 1 mL (1 mg total) into the vein as needed.  Marland Kitchen EPINEPHrine (EPIPEN JR) 0.15 MG/0.3ML injection Inject 0.3 mLs (0.15 mg total) into the muscle as needed for anaphylaxis. (Patient not taking: Reported on 04/11/2014)  . HYDROcodone-homatropine (HYCODAN) 5-1.5 MG/5ML syrup Take 5 mLs by mouth every 8 (eight) hours as needed. (Patient not taking: Reported on 04/11/2014)  . valACYclovir (VALTREX) 1000 MG tablet One tablet twice a day x 5 days as needed for outbreak (Patient not taking: Reported on 04/11/2014)   No current facility-administered medications on file prior to visit.    Review of Systems Per HPI unless specifically indicated above     Objective:    BP 110/68 mmHg  Pulse 83  Temp(Src) 97.9 F (36.6 C)  Wt 173 lb (78.472 kg)  Wt Readings from Last 3 Encounters:  04/11/14 173 lb (78.472 kg)  02/21/14 176 lb (79.833 kg)  02/18/14 176 lb (79.833 kg)    Physical Exam  Constitutional: She appears well-developed and well-nourished. No distress.  HENT:  Head: Normocephalic and atraumatic.  Right Ear: Hearing normal.  Left Ear: Hearing normal.  Nose: No mucosal edema or rhinorrhea. Right sinus exhibits no maxillary sinus tenderness and no frontal sinus tenderness. Left sinus exhibits no maxillary sinus tenderness and no frontal sinus tenderness.  Mouth/Throat: Uvula is midline, oropharynx is clear and moist and  mucous membranes are normal. No oropharyngeal exudate, posterior oropharyngeal edema, posterior oropharyngeal erythema or tonsillar abscesses.  Eyes: Conjunctivae and EOM are normal. Pupils are equal, round, and reactive to light. No scleral icterus.  Neck: Normal range of motion. Neck supple.  Neck supple, no meningeal signs  Cardiovascular: Normal rate, regular  rhythm, normal heart sounds and intact distal pulses.   No murmur heard. No murmur  Pulmonary/Chest: Effort normal and breath sounds normal. No respiratory distress. She has no wheezes. She has no rales.  Abdominal: Soft. Bowel sounds are normal. She exhibits no distension and no mass. There is no hepatosplenomegaly. There is tenderness (mild-moderate) in the right upper quadrant. There is no rigidity, no rebound, no guarding, no CVA tenderness and negative Murphy's sign.  Musculoskeletal: She exhibits no edema.  Lymphadenopathy:    She has no cervical adenopathy.  Neurological: She is alert.  Neg kernig/bruzynski  Skin: Skin is warm and dry. No rash noted.  Nursing note and vitals reviewed.      Assessment & Plan:   Problem List Items Addressed This Visit    Influenza - Primary    sxs most consistent with influenza - +sick contacts at home, high fevers with myalgias and diarrhea. Flu swab today positive Ongoing symptoms 7 days, fever broke last night and seems to be improving as of today. Discussed tamiflu, recommend against this.  Discussed red flags to seek urgent care again - worsening sxs, increasing fever, worsening productive cough. Abnormal UA today - anticipate due to fever, flu. Suggested recheck once feeling better as no urinary sxs today. Pt/daughter agree with plan.       Other Visit Diagnoses    Other specified fever        Relevant Orders    POC Influenza A/B (Completed)    Abnormal urine odor        Relevant Orders    POC Urinalysis Dipstick (Completed)        Follow up plan: No Follow-up on file.

## 2014-04-11 NOTE — Patient Instructions (Signed)

## 2014-04-17 ENCOUNTER — Encounter: Payer: Self-pay | Admitting: Internal Medicine

## 2014-07-08 ENCOUNTER — Other Ambulatory Visit: Payer: Self-pay | Admitting: Family Medicine

## 2014-08-24 ENCOUNTER — Telehealth: Payer: Self-pay

## 2014-08-24 DIAGNOSIS — Z1231 Encounter for screening mammogram for malignant neoplasm of breast: Secondary | ICD-10-CM

## 2014-08-24 NOTE — Telephone Encounter (Signed)
Ordered mammogram.

## 2014-08-31 ENCOUNTER — Ambulatory Visit (INDEPENDENT_AMBULATORY_CARE_PROVIDER_SITE_OTHER): Payer: BLUE CROSS/BLUE SHIELD | Admitting: Family Medicine

## 2014-08-31 ENCOUNTER — Encounter: Payer: Self-pay | Admitting: Family Medicine

## 2014-08-31 VITALS — BP 110/62 | HR 87 | Temp 98.8°F | Wt 172.0 lb

## 2014-08-31 DIAGNOSIS — R509 Fever, unspecified: Secondary | ICD-10-CM

## 2014-08-31 DIAGNOSIS — R300 Dysuria: Secondary | ICD-10-CM | POA: Diagnosis not present

## 2014-08-31 LAB — POCT URINALYSIS DIPSTICK
Bilirubin, UA: NEGATIVE
Glucose, UA: NEGATIVE
Ketones, UA: NEGATIVE
Nitrite, UA: NEGATIVE
Protein, UA: NEGATIVE
Spec Grav, UA: 1.015
Urobilinogen, UA: 0.2
pH, UA: 6

## 2014-08-31 LAB — POCT INFLUENZA A/B
Influenza A, POC: NEGATIVE
Influenza B, POC: NEGATIVE

## 2014-08-31 NOTE — Patient Instructions (Signed)

## 2014-08-31 NOTE — Progress Notes (Signed)
Pre visit review using our clinic review tool, if applicable. No additional management support is needed unless otherwise documented below in the visit note. 

## 2014-08-31 NOTE — Progress Notes (Signed)
   Subjective:    Patient ID: Jaime Fuller, female    DOB: 1950-06-24, 64 y.o.   MRN: [id number]  HPI Patient seen with complaints of body aches, headache, fever up to 102 yesterday. She has somewhat similar symptoms a few weeks ago and symptoms resolved within about 24 hours. She had fever last night and this morning. She's not had any documented fever since then. She denies any sore throat, abdominal pain, dysuria, tick bites, rash, or any recent travels. She has some general arthralgias which are unchanged. She has had some myalgias but not severe. She states she was diagnosed with influenza back in February and felt somewhat similar then. She had one episode of diarrhea but no vomiting. She is requesting a flu screen.  Past Medical History  Diagnosis Date  . Anxiety state, unspecified 09/20/2007  . HYPOTHYROIDISM 03/08/2007  . Pancreatitis     secondary to ERCP   Past Surgical History  Procedure Laterality Date  . Gastric bypass    . Cholecystectomy    . Abdominal plasty    . Childbirth x 2    . Wisdom tooth extraction    . Carpel tunnel surgery left hand      reports that she has quit smoking. She has never used smokeless tobacco. She reports that she does not drink alcohol or use illicit drugs. family history includes Heart attack in her father; Lung cancer in her father. Allergies  Allergen Reactions  . Vicodin [Hydrocodone-Acetaminophen] Itching      Review of Systems  Constitutional: Positive for fever, chills, appetite change and fatigue.  HENT: Negative for sore throat.   Respiratory: Negative for cough.   Cardiovascular: Negative for chest pain.  Gastrointestinal: Negative for abdominal pain.  Genitourinary: Negative for dysuria.  Hematological: Negative for adenopathy.       Objective:   Physical Exam  Constitutional: She appears well-developed and well-nourished.  HENT:  Right Ear: External ear normal.  Left Ear: External ear normal.  Mouth/Throat:  Oropharynx is clear and moist.  Neck: Neck supple.  Cardiovascular: Normal rate and regular rhythm.   Pulmonary/Chest: Effort normal and breath sounds normal. No respiratory distress. She has no wheezes. She has no rales.  Abdominal: Soft. There is no tenderness. There is no rebound.  Lymphadenopathy:    She has no cervical adenopathy.  Skin: No rash noted.          Assessment & Plan:  Fever. Nonfocal exam and afebrile at this time. Question viral. Patient requesting flu screen but clinical suspicion is low. Will check urinalysis and rapid screen for influenza. If negative observe and treat symptomatically. She is nontoxic in appearance  Flu screen negative.  Urine dip does show some persistent blood.  Micro sent and if > 3 RBC/HPF urology evaluation.

## 2014-09-01 LAB — URINALYSIS, MICROSCOPIC ONLY

## 2014-09-02 ENCOUNTER — Other Ambulatory Visit: Payer: Self-pay | Admitting: *Deleted

## 2014-09-02 MED ORDER — CEPHALEXIN 500 MG PO CAPS: 500.0000 mg | ORAL_CAPSULE | Freq: Three times a day (TID) | ORAL | Status: DC

## 2014-09-03 LAB — URINE CULTURE: Colony Count: >=100000

## 2014-09-28 ENCOUNTER — Ambulatory Visit
Admission: RE | Admit: 2014-09-28 | Discharge: 2014-09-28 | Disposition: A | Discharge status: Home / Self Care | Payer: BLUE CROSS/BLUE SHIELD | Source: Ambulatory Visit | Attending: Family Medicine | Admitting: Family Medicine

## 2014-09-28 DIAGNOSIS — Z1231 Encounter for screening mammogram for malignant neoplasm of breast: Secondary | ICD-10-CM

## 2014-10-16 ENCOUNTER — Ambulatory Visit (INDEPENDENT_AMBULATORY_CARE_PROVIDER_SITE_OTHER): Payer: BLUE CROSS/BLUE SHIELD | Admitting: Internal Medicine

## 2014-10-16 ENCOUNTER — Encounter: Payer: Self-pay | Admitting: Internal Medicine

## 2014-10-16 VITALS — BP 110/70 | HR 68 | Temp 96.2°F | Wt 172.0 lb

## 2014-10-16 DIAGNOSIS — L237 Allergic contact dermatitis due to plants, except food: Secondary | ICD-10-CM

## 2014-10-16 MED ORDER — PREDNISONE 10 MG PO TABS: ORAL_TABLET | ORAL | Status: DC

## 2014-10-16 NOTE — Patient Instructions (Signed)

## 2014-10-16 NOTE — Progress Notes (Signed)
Subjective:    Patient ID: Jaime Fuller, female    DOB: 1950-08-28, 64 y.o.   MRN: [id number]  HPI  64 year old patient who presents with a pruritic rash involving primarily her left arm.  She was doing yard work yesterday and had the onset of a very pruritic rash involving both arms, especially the left.  She has had the treatment for contact dermatitis in the past  Past Medical History  Diagnosis Date  . Anxiety state, unspecified 09/20/2007  . HYPOTHYROIDISM 03/08/2007  . Pancreatitis     secondary to ERCP    History   Social History  . Marital Status: Married    Spouse Name: N/A  . Number of Children: N/A  . Years of Education: N/A   Occupational History  . Not on file.   Social History Main Topics  . Smoking status: Former Research scientist (life sciences)  . Smokeless tobacco: Never Used  . Alcohol Use: No  . Drug Use: No  . Sexual Activity: Not on file   Other Topics Concern  . Not on file   Social History Narrative    Past Surgical History  Procedure Laterality Date  . Gastric bypass    . Cholecystectomy    . Abdominal plasty    . Childbirth x 2    . Wisdom tooth extraction    . Carpel tunnel surgery left hand      Family History  Problem Relation Age of Onset  . Lung cancer Father   . Heart attack Father     Died at 32    Allergies  Allergen Reactions  . Vicodin [Hydrocodone-Acetaminophen] Itching    Current Outpatient Prescriptions on File Prior to Visit  Medication Sig Dispense Refill  . clonazePAM (KLONOPIN) 0.5 MG tablet Take 0.5 mg by mouth 2 (two) times daily as needed for anxiety.    . cyanocobalamin (,VITAMIN B-12,) 1000 MCG/ML injection INJECT 1 ML (1,000 MCG TOTAL) INTO THE MUSCLE EVERY 30 (THIRTY) DAYS. 30 mL 11  . EPINEPHrine (EPIPEN JR) 0.15 MG/0.3ML injection Inject 0.3 mLs (0.15 mg total) into the muscle as needed for anaphylaxis. 2 each 3  . montelukast (SINGULAIR) 10 MG tablet Take 1 tablet (10 mg total) by mouth at bedtime. 30 tablet 3  . naloxone  (NARCAN) 1 MG/ML injection Inject 1 mL (1 mg total) into the vein as needed. 2 mL 1  . valACYclovir (VALTREX) 1000 MG tablet ONE TABLET TWICE A DAY X 5 DAYS AS NEEDED FOR OUTBREAK 20 tablet 2   No current facility-administered medications on file prior to visit.    BP 110/70 mmHg  Pulse 68  Temp(Src) 96.2 F (35.7 C) (Oral)  Wt 172 lb (78.019 kg)     Review of Systems  Constitutional: Negative.   HENT: Negative for congestion, dental problem, hearing loss, rhinorrhea, sinus pressure, sore throat and tinnitus.   Eyes: Negative for pain, discharge and visual disturbance.  Respiratory: Negative for cough and shortness of breath.   Cardiovascular: Negative for chest pain, palpitations and leg swelling.  Gastrointestinal: Negative for nausea, vomiting, abdominal pain, diarrhea, constipation, blood in stool and abdominal distention.  Genitourinary: Negative for dysuria, urgency, frequency, hematuria, flank pain, vaginal bleeding, vaginal discharge, difficulty urinating, vaginal pain and pelvic pain.  Musculoskeletal: Negative for joint swelling, arthralgias and gait problem.  Skin: Positive for rash.  Neurological: Negative for dizziness, syncope, speech difficulty, weakness, numbness and headaches.  Hematological: Negative for adenopathy.  Psychiatric/Behavioral: Negative for behavioral problems, dysphoric mood and agitation. The  patient is not nervous/anxious.        Objective:   Physical Exam  Constitutional: She appears well-developed and well-nourished. No distress.  Skin:  Scattered erythematous papular rash involving the inner aspect of the left arm and hand.  A few scattered lesions also present involving the right arm          Assessment & Plan:   Contact dermatitis/poison ivy.  Will treat with a prednisone Dosepak Information dispensed We'll call if she develops any worsening or new symptoms

## 2014-10-16 NOTE — Progress Notes (Signed)
Pre visit review using our clinic review tool, if applicable. No additional management support is needed unless otherwise documented below in the visit note. 

## 2014-10-21 ENCOUNTER — Telehealth: Payer: Self-pay | Admitting: Family Medicine

## 2014-10-21 MED ORDER — PREDNISONE 5 MG PO TABS: ORAL_TABLET | ORAL | Status: DC

## 2014-10-21 NOTE — Telephone Encounter (Signed)
Hard copy faxed and patient is aware.

## 2014-10-21 NOTE — Telephone Encounter (Signed)
Please call in a new prescription for a generic 5 mg 12 day prednisone Dosepak

## 2014-10-21 NOTE — Telephone Encounter (Signed)
Pt states she is on her last 1/day prednisone regimen, and pt states she Woke up last night, itching, inflamed and Poison oak appears to returning. Pt states she had some 20 mg prednisone and took that this am since things were so bad. pls advise  [facility]/ [address]

## 2014-10-28 ENCOUNTER — Telehealth: Payer: Self-pay | Admitting: Family Medicine

## 2014-10-28 ENCOUNTER — Other Ambulatory Visit: Payer: Self-pay | Admitting: Internal Medicine

## 2014-10-28 MED ORDER — PREDNISONE 5 MG PO TABS: ORAL_TABLET | ORAL | Status: DC

## 2014-10-28 NOTE — Telephone Encounter (Signed)
Please notify patient that prednisone Dosepak has been refilled

## 2014-10-28 NOTE — Telephone Encounter (Signed)
Left message on personal voicemail Rx sent to pharmacy.

## 2014-10-28 NOTE — Telephone Encounter (Signed)
Pt said she saw Dr Raliegh Ip for  poison oak and he has given her medicine and it was doing good. She said now her right hand has broken out again. She is asking what does she need to do. She said it came back when she got down to one dose of the medicine

## 2014-10-28 NOTE — Telephone Encounter (Signed)
Please see message and advise 

## 2014-11-09 ENCOUNTER — Ambulatory Visit (INDEPENDENT_AMBULATORY_CARE_PROVIDER_SITE_OTHER): Payer: BLUE CROSS/BLUE SHIELD | Admitting: *Deleted

## 2014-11-09 DIAGNOSIS — Z23 Encounter for immunization: Secondary | ICD-10-CM

## 2015-06-28 ENCOUNTER — Telehealth: Payer: Self-pay | Admitting: Family Medicine

## 2015-06-28 NOTE — Telephone Encounter (Signed)
Pt has been sch

## 2015-06-28 NOTE — Telephone Encounter (Signed)
Pt is having chest pain when she is lying on her left side and flat and deep breath and laughing (this is sometime episode).  Pt states if she sits straight up it is fine.  Reclining in chair it hurts as well.  Took Prilosec thinking it would and it did stop the pain.

## 2015-06-28 NOTE — Telephone Encounter (Signed)
Please schedule patient 06/29/15 at 8:15 am for UTI. Patient is aware. Thanks.

## 2015-06-29 ENCOUNTER — Ambulatory Visit (INDEPENDENT_AMBULATORY_CARE_PROVIDER_SITE_OTHER): Payer: BLUE CROSS/BLUE SHIELD | Admitting: Family Medicine

## 2015-06-29 ENCOUNTER — Encounter: Payer: Self-pay | Admitting: Family Medicine

## 2015-06-29 VITALS — BP 120/80 | Temp 97.6°F | Wt 185.5 lb

## 2015-06-29 DIAGNOSIS — R109 Unspecified abdominal pain: Secondary | ICD-10-CM | POA: Diagnosis not present

## 2015-06-29 DIAGNOSIS — M545 Low back pain: Secondary | ICD-10-CM | POA: Diagnosis not present

## 2015-06-29 DIAGNOSIS — R3129 Other microscopic hematuria: Secondary | ICD-10-CM | POA: Insufficient documentation

## 2015-06-29 LAB — BASIC METABOLIC PANEL
BUN: 17 mg/dL (ref 6–23)
CO2: 30 mEq/L (ref 19–32)
Calcium: 9.2 mg/dL (ref 8.4–10.5)
Chloride: 104 mEq/L (ref 96–112)
Creatinine, Ser: 0.82 mg/dL (ref 0.40–1.20)
GFR: 74.69 mL/min (ref >60.00)
Glucose, Bld: 90 mg/dL (ref 70–99)
Potassium: 4.1 mEq/L (ref 3.5–5.1)
Sodium: 139 mEq/L (ref 135–145)

## 2015-06-29 LAB — POCT URINALYSIS DIPSTICK
Bilirubin, UA: NEGATIVE
Glucose, UA: NEGATIVE
Ketones, UA: NEGATIVE
Nitrite, UA: POSITIVE
Protein, UA: NEGATIVE
Spec Grav, UA: 1.02
Urobilinogen, UA: 0.2
pH, UA: 6

## 2015-06-29 LAB — CBC WITH DIFFERENTIAL/PLATELET
Basophils Absolute: 0 10*3/uL (ref 0.0–0.1)
Basophils Relative: 0.7 % (ref 0.0–3.0)
Eosinophils Absolute: 0.1 10*3/uL (ref 0.0–0.7)
Eosinophils Relative: 2.3 % (ref 0.0–5.0)
HCT: 35.8 % — ABNORMAL LOW (ref 36.0–46.0)
Hemoglobin: 11.8 g/dL — ABNORMAL LOW (ref 12.0–15.0)
Lymphocytes Relative: 36.9 % (ref 12.0–46.0)
Lymphs Abs: 1.9 10*3/uL (ref 0.7–4.0)
MCHC: 32.8 g/dL (ref 30.0–36.0)
MCV: 81.8 fl (ref 78.0–100.0)
Monocytes Absolute: 0.5 10*3/uL (ref 0.1–1.0)
Monocytes Relative: 9.2 % (ref 3.0–12.0)
Neutro Abs: 2.6 10*3/uL (ref 1.4–7.7)
Neutrophils Relative %: 50.9 % (ref 43.0–77.0)
Platelets: 240 10*3/uL (ref 150.0–400.0)
RBC: 4.38 Mil/uL (ref 3.87–5.11)
RDW: 15.4 % (ref 11.5–15.5)
WBC: 5.2 10*3/uL (ref 4.0–10.5)

## 2015-06-29 MED ORDER — TRAMADOL HCL 50 MG PO TABS: 50.0000 mg | ORAL_TABLET | Freq: Three times a day (TID) | ORAL | Status: DC | PRN

## 2015-06-29 MED ORDER — CIPROFLOXACIN HCL 250 MG PO TABS: 250.0000 mg | ORAL_TABLET | Freq: Two times a day (BID) | ORAL | Status: DC

## 2015-06-29 NOTE — Patient Instructions (Signed)
Drink lots of water  Labs today  We'll set you up for a scan of your kidneys  We will call you when we get the report  Tramadol 50 mg...........Marland Kitchen 1/2-1 tablet every 6-8 hours for pain  Cipro 250 mg....... one twice daily....... urine culture pending

## 2015-06-29 NOTE — Progress Notes (Signed)
Pre visit review using our clinic review tool, if applicable. No additional management support is needed unless otherwise documented below in the visit note. 

## 2015-06-29 NOTE — Progress Notes (Signed)
   Subjective:    Patient ID: Jaime Fuller, female    DOB: [birth year], 65 y.o.   MRN: [id number]  HPI Jaime Fuller is a 65 year old married female nonsmoker who comes in today for evaluation of left flank pain  She said she was well until April 5 when she had the gradual onset of right and left lower back pain. That lasted about a day and then seemed to ease off. The next night she had severe pain in the left flank. It seemed to radiate into her left upper quadrant of her abdomen. She had no fever chills nausea vomiting or diarrhea. She did notice some dark urine. She's had no urinary tract symptoms.  She also relates she was here in January had some fevers it would come and go. She was diagnosed with urinary tract infection put on antibiotics. Culture showed an Escherichia coli.  She's never had a kidney stone prior to this in the past. Now her pain varies from 03/14/2004   Review of Systems    review of systems negative Review of systems negative    Physical Exam Well-developed well-nourished female no acute distress vital signs stable she's afebrile examination of back shows no palpable tenderness. Abdominal exam is negative. Urine is positive for nitrate red cells and small white cells       Assessment & Plan:  Flank pain with hematuria and secondary UTI............ suspect she might have a kidney stone,,,,,,,,, plan CTA begin antibiotics for UTI culture urine

## 2015-06-30 ENCOUNTER — Ambulatory Visit (INDEPENDENT_AMBULATORY_CARE_PROVIDER_SITE_OTHER)
Admission: RE | Admit: 2015-06-30 | Discharge: 2015-06-30 | Disposition: A | Discharge status: Home / Self Care | Payer: BLUE CROSS/BLUE SHIELD | Source: Ambulatory Visit | Attending: Family Medicine | Admitting: Family Medicine

## 2015-06-30 DIAGNOSIS — R3129 Other microscopic hematuria: Secondary | ICD-10-CM | POA: Diagnosis not present

## 2015-06-30 DIAGNOSIS — R109 Unspecified abdominal pain: Secondary | ICD-10-CM

## 2015-07-01 ENCOUNTER — Telehealth: Payer: Self-pay | Admitting: Family Medicine

## 2015-07-01 DIAGNOSIS — N200 Calculus of kidney: Secondary | ICD-10-CM

## 2015-07-01 NOTE — Telephone Encounter (Signed)
Pt called about the results of her scan she had yesterday .Would like a call back .

## 2015-07-02 ENCOUNTER — Telehealth: Payer: Self-pay | Admitting: Family Medicine

## 2015-07-02 LAB — URINE CULTURE: Colony Count: >=100000

## 2015-07-02 NOTE — Telephone Encounter (Signed)
Patient is going to urology per Dr Sherren Mocha

## 2015-07-02 NOTE — Telephone Encounter (Signed)
Pt called about her CT scan results and said she still has blood in her urine and is asking if she need to be on an antibiotic

## 2015-07-02 NOTE — Telephone Encounter (Signed)
Per Dr Sherren Mocha patient should go to urology. Referral placed and patient is aware.

## 2015-07-13 ENCOUNTER — Other Ambulatory Visit: Payer: Self-pay | Admitting: Urology

## 2015-07-13 ENCOUNTER — Encounter (HOSPITAL_COMMUNITY): Payer: Self-pay

## 2015-07-15 ENCOUNTER — Ambulatory Visit (HOSPITAL_COMMUNITY): Payer: BLUE CROSS/BLUE SHIELD

## 2015-07-15 ENCOUNTER — Encounter (HOSPITAL_COMMUNITY)
Admission: RE | Disposition: A | Discharge status: Home / Self Care | Payer: Self-pay | Source: Ambulatory Visit | Attending: Urology

## 2015-07-15 ENCOUNTER — Ambulatory Visit (HOSPITAL_COMMUNITY)
Admission: RE | Admit: 2015-07-15 | Discharge: 2015-07-15 | Disposition: A | Discharge status: Home / Self Care | Payer: BLUE CROSS/BLUE SHIELD | Source: Ambulatory Visit | Attending: Urology | Admitting: Urology

## 2015-07-15 ENCOUNTER — Encounter (HOSPITAL_COMMUNITY): Payer: Self-pay | Admitting: *Deleted

## 2015-07-15 DIAGNOSIS — Z87891 Personal history of nicotine dependence: Secondary | ICD-10-CM | POA: Diagnosis not present

## 2015-07-15 DIAGNOSIS — N200 Calculus of kidney: Secondary | ICD-10-CM | POA: Diagnosis present

## 2015-07-15 DIAGNOSIS — G4733 Obstructive sleep apnea (adult) (pediatric): Secondary | ICD-10-CM | POA: Insufficient documentation

## 2015-07-15 DIAGNOSIS — Z79899 Other long term (current) drug therapy: Secondary | ICD-10-CM | POA: Diagnosis not present

## 2015-07-15 DIAGNOSIS — M1990 Unspecified osteoarthritis, unspecified site: Secondary | ICD-10-CM | POA: Insufficient documentation

## 2015-07-15 HISTORY — DX: Chronic kidney disease, unspecified: N18.9

## 2015-07-15 SURGERY — LITHOTRIPSY, ESWL
Anesthesia: LOCAL | Laterality: Left

## 2015-07-15 MED ORDER — SODIUM CHLORIDE 0.9 % IV SOLN
INTRAVENOUS | Status: DC
  Administered 2015-07-15: 17:00:00 via INTRAVENOUS

## 2015-07-15 MED ORDER — CEPHALEXIN 500 MG PO CAPS: 500.0000 mg | ORAL_CAPSULE | Freq: Two times a day (BID) | ORAL | Status: DC

## 2015-07-15 MED ORDER — CIPROFLOXACIN HCL 500 MG PO TABS
500.0000 mg | ORAL_TABLET | ORAL | Status: AC
  Administered 2015-07-15: 500 mg via ORAL
  Filled 2015-07-15: qty 1

## 2015-07-15 MED ORDER — DIAZEPAM 5 MG PO TABS
10.0000 mg | ORAL_TABLET | ORAL | Status: AC
  Administered 2015-07-15: 10 mg via ORAL
  Filled 2015-07-15: qty 2

## 2015-07-15 MED ORDER — DIPHENHYDRAMINE HCL 25 MG PO CAPS
25.0000 mg | ORAL_CAPSULE | ORAL | Status: AC
  Administered 2015-07-15: 25 mg via ORAL
  Filled 2015-07-15: qty 1

## 2015-07-15 MED ORDER — OXYCODONE-ACETAMINOPHEN 5-325 MG PO TABS: 1.0000 | ORAL_TABLET | Freq: Four times a day (QID) | ORAL | Status: DC | PRN

## 2015-07-15 MED ORDER — TAMSULOSIN HCL 0.4 MG PO CAPS: 0.4000 mg | ORAL_CAPSULE | Freq: Every day | ORAL | Status: DC

## 2015-07-15 NOTE — H&P (Signed)
History of Present Illness Consultation renal stone referred by Dr. Sherren Mocha. Patient developed some low back pain April 2017 after a 12 hour car ride. The pain was in her back and then her left abdomen. The pain is more bearable nail and it seems to be worse when she lays on her left side or bends over. Nothing seems to make it worse. She has no history of kidney stones and never passed a stone. She had no lower urinary tract symptoms such as dysuria frequency or urgency. A UA showed blood on the dip but no microscopic exam. Urine Cx grew e coli. She underwent a CT scan of the abdomen and pelvis which showed a normal urinary tract apart from a 7 mm left upper pole stone which I believe may be visible on the scout image. Looking back a June 2016 UA showed no microscopic hematuria but also grew Escherichia coli. Her UA today also shows blood on the dip but again no microscopic hematuria. She's had no gross hematuria. She is a remote smoker. No other exposures.    Past Medical History Problems  1. History of arthritis (Z87.39) 2. History of carpal tunnel syndrome (Z86.69) 3. History of hypothyroidism (Z86.39) 4. History of Obstructive sleep apnea, adult (G47.33)  Surgical History Problems  1. History of Abdominoplasty 2. History of Cholecystectomy 3. History of Intestinal Surgery Roux-en-Y 4. History of Wrist Surgery 5. History of Wrist Surgery  Current Meds 1. KlonoPIN 0.5 MG Oral Tablet;  Therapy: (Recorded:02May2017) to Recorded 2. TraMADol HCl - 50 MG Oral Tablet;  Therapy: (Recorded:02May2017) to Recorded 3. Vitamin B-12 1000 MCG/ML SOLN;  Therapy: (Recorded:02May2017) to Recorded  Allergies Medication  1. hydrocodone  Family History Problems  1. Family history of Death of family member : Father 2. Family history of hypothyroidism (Z83.49) : Mother 3. Family history of kidney stones (Z84.1) : Father  Social History Problems    Denied: History of Alcohol use   Caffeine use  (F15.90)   Former smoker [phone])   Married   Number of children   Retired  Review of Systems Genitourinary, constitutional, skin, eye, otolaryngeal, hematologic/lymphatic, cardiovascular, pulmonary, endocrine, musculoskeletal, gastrointestinal, neurological and psychiatric system(s) were reviewed and pertinent findings if present are noted and are otherwise negative.    Vitals Vital Signs [Data Includes: Last 1 Day]  Recorded: [id number] 11:18AM  Height: 5 ft 3 in Weight: 185 lb  BMI Calculated: 32.77 BSA Calculated: 1.87 Blood Pressure: 107 / 64 Temperature: 97.8 F Heart Rate: 59  Physical Exam Constitutional: Well nourished and well developed . No acute distress.  Pulmonary: No respiratory distress and normal respiratory rhythm and effort.  Cardiovascular: Heart rate and rhythm are normal . No peripheral edema.  Abdomen: The abdomen is soft and nontender. No masses are palpated. No CVA tenderness. No hernias are palpable. No hepatosplenomegaly noted. Chaperone: Chasity  Neuro/Psych:. Mood and affect are appropriate.    Results/Data Urine [Data Includes: Last 1 Day]   [id number]  COLOR YELLOW   APPEARANCE CLEAR   SPECIFIC GRAVITY 1.025   pH 6.0   GLUCOSE NEGATIVE   BILIRUBIN NEGATIVE   KETONE NEGATIVE   BLOOD 1+   PROTEIN NEGATIVE   NITRITE NEGATIVE   LEUKOCYTE ESTERASE NEGATIVE   SQUAMOUS EPITHELIAL/HPF 0-5 HPF  WBC 0-5 WBC/HPF  RBC 0-2 RBC/HPF  BACTERIA NONE SEEN HPF  CRYSTALS NONE SEEN HPF  CASTS NONE SEEN LPF  Yeast NONE SEEN HPF   Old records or history reviewed:Marland Kitchen  The following images/tracing/specimen were independently  visualized:  CT.    Assessment Assessed  1. Nephrolithiasis (N20.0)  Plan Health Maintenance  1. UA With REFLEX; [Do Not Release]; Status:Complete;   [id number] 10:37AM Nephrolithiasis  2. KUB; Status:Complete;   [id number] 11:52AM  Discussion/Summary Left renal stone-I discussed with her based on her imaging and  history I doubt the stone in her left kidney is causing any of her symptoms. Also it sounds like she has had some asymptomatic bacteriuria with minimal lower urinary tract symptoms and no microscopic hematuria on urinalysis. We discussed the nature, risks, benefits of surveillance of the left renal stone, shockwave lithotripsy or ureteroscopy. We discussed expected level of success, intra-op and post-op problems with ESWL or URS. Patient asked if the stone can be moving around and causing pain and I doubt it as it looks fairly stable on the KUB. All questions answered and she elects to proceed with shockwave lithotripsy of the left renal stone ASAP as she'll out of town next week.       Signatures Electronically signed by : Festus Aloe, M.D.; Jul 13 2015 12:29PM EST

## 2015-07-15 NOTE — Interval H&P Note (Signed)
History and Physical Interval Note:  07/15/2015 4:48 PM  Jaime Fuller  has presented today for surgery, with the diagnosis of LEFT RENAL STONE  The various methods of treatment have been discussed with the patient and family. After consideration of risks, benefits and other options for treatment, the patient has consented to  Procedure(s): LEFT EXTRACORPOREAL SHOCK WAVE LITHOTRIPSY (ESWL) (Left) as a surgical intervention .  The patient's history has been reviewed, patient examined, no change in status, stable for surgery.  I have reviewed the patient's chart and labs.  Questions were answered to the patient's satisfaction.  She is well with no fever or dysuria.    [name]

## 2015-07-15 NOTE — Discharge Instructions (Signed)
Lithotripsy, Care After °Refer to this sheet in the next few weeks. These instructions provide you with information on caring for yourself after your procedure. Your health care provider may also give you more specific instructions. Your treatment has been planned according to current medical practices, but problems sometimes occur. Call your health care provider if you have any problems or questions after your procedure. °WHAT TO EXPECT AFTER THE PROCEDURE  °· Your urine may have a red tinge for a few days after treatment. Blood loss is usually minimal. °· You may have soreness in the back or flank area. This usually goes away after a few days. The procedure can cause blotches or bruises on the back where the pressure wave enters the skin. These marks usually cause only minimal discomfort and should disappear in a short time. °· Stone fragments should begin to pass within 24 hours of treatment. However, a delayed passage is not unusual. °· You may have pain, discomfort, and feel sick to your stomach (nauseated) when the crushed fragments of stone are passed down the tube from the kidney to the bladder. Stone fragments can pass soon after the procedure and may last for up to 4-8 weeks. °· A small number of patients may have severe pain when stone fragments are not able to pass, which leads to an obstruction. °· If your stone is greater than 1 inch (2.5 cm) in diameter or if you have multiple stones that have a combined diameter greater than 1 inch (2.5 cm), you may require more than one treatment. °· If you had a stent placed prior to your procedure, you may experience some discomfort, especially during urination. You may experience the pain or discomfort in your flank or back, or you may experience a sharp pain or discomfort at the base of your penis or in your lower abdomen. The discomfort usually lasts only a few minutes after urinating. °HOME CARE INSTRUCTIONS  °· Rest at home until you feel your energy  improving. °· Only take over-the-counter or prescription medicines for pain, discomfort, or fever as directed by your health care provider. Depending on the type of lithotripsy, you may need to take antibiotics and anti-inflammatory medicines for a few days. °· Drink enough water and fluids to keep your urine clear or pale yellow. This helps "flush" your kidneys. It helps pass any remaining pieces of stone and prevents stones from coming back. °· Most people can resume daily activities within 1-2 days after standard lithotripsy. It can take longer to recover from laser and percutaneous lithotripsy. °· Strain all urine through the provided strainer. Keep all particulate matter and stones for your health care provider to see. The stone may be as small as a grain of salt. It is very important to use the strainer each and every time you pass your urine. Any stones that are found can be sent to a medical lab for examination. °· Visit your health care provider for a follow-up appointment in a few weeks. Your doctor may remove your stent if you have one. Your health care provider will also check to see whether stone particles still remain. °SEEK MEDICAL CARE IF:  °· Your pain is not relieved by medicine. °· You have a lasting nauseous feeling. °· You feel there is too much blood in the urine. °· You develop persistent problems with frequent or painful urination that does not at least partially improve after 2 days following the procedure. °· You have a congested cough. °· You feel   lightheaded. °· You develop a rash or any other signs that might suggest an allergic problem. °· You develop any reaction or side effects to your medicine(s). °SEEK IMMEDIATE MEDICAL CARE IF:  °· You experience severe back or flank pain or both. °· You see nothing but blood when you urinate. °· You cannot pass any urine at all. °· You have a fever or shaking chills. °· You develop shortness of breath, difficulty breathing, or chest pain. °· You  develop vomiting that will not stop after 6-8 hours. °· You have a fainting episode. °  °This information is not intended to replace advice given to you by your health care provider. Make sure you discuss any questions you have with your health care provider. °  °Document Released: 03/19/2007 Document Revised: 11/18/2014 Document Reviewed: 09/12/2012 °Elsevier Interactive Patient Education ©2016 [employer]    ° ° ° °                                                                                                           Moderate Conscious Sedation, Adult, Care After °Refer to this sheet in the next few weeks. These instructions provide you with information on caring for yourself after your procedure. Your health care provider may also give you more specific instructions. Your treatment has been planned according to current medical practices, but problems sometimes occur. Call your health care provider if you have any problems or questions after your procedure. °WHAT TO EXPECT AFTER THE PROCEDURE  °After your procedure: °· You may feel sleepy, clumsy, and have poor balance for several hours. °· Vomiting may occur if you eat too soon after the procedure. °HOME CARE INSTRUCTIONS °· Do not participate in any activities where you could become injured for at least 24 hours. Do not: °¨ Drive. °¨ Swim. °¨ Ride a bicycle. °¨ Operate heavy machinery. °¨ Cook. °¨ Use power tools. °¨ Climb ladders. °¨ Work from a high place. °· Do not make important decisions or sign legal documents until you are improved. °· If you vomit, drink water, juice, or soup when you can drink without vomiting. Make sure you have little or no nausea before eating solid foods. °· Only take over-the-counter or prescription medicines for pain, discomfort, or fever as directed by your health care provider. °· Make sure you and your family fully understand everything about the medicines given to you, including what side effects may occur. °· You should  not drink alcohol, take sleeping pills, or take medicines that cause drowsiness for at least 24 hours. °· If you smoke, do not smoke without supervision. °· If you are feeling better, you may resume normal activities 24 hours after you were sedated. °· Keep all appointments with your health care provider. °SEEK MEDICAL CARE IF: °· Your skin is pale or bluish in color. °· You continue to feel nauseous or vomit. °· Your pain is getting worse and is not helped by medicine. °· You have bleeding or swelling. °· You are still sleepy or feeling clumsy after 24 hours. °SEEK

## 2015-07-15 NOTE — Interval H&P Note (Signed)
History and Physical Interval Note:  07/15/2015 4:55 PM  An additional note - pt confirms she did take the Cipro Dr. Sherren Mocha gave her for three days. Her initial UA showed +LE. When I saw her in office, her UA was clear with no N, LE or bacteria. Again, she has had no fever or dysuria.    Jaime Fuller

## 2015-07-15 NOTE — Op Note (Signed)
Left ESWL  Left 7 mm upper pole stone  Findings: excellent fragmentation; pt tolerated well. Stable.

## 2015-07-21 ENCOUNTER — Emergency Department (HOSPITAL_COMMUNITY)
Admission: EM | Admit: 2015-07-21 | Discharge: 2015-07-21 | Disposition: A | Discharge status: Home / Self Care | Payer: BLUE CROSS/BLUE SHIELD | Attending: Emergency Medicine | Admitting: Emergency Medicine

## 2015-07-21 ENCOUNTER — Encounter (HOSPITAL_COMMUNITY): Payer: Self-pay | Admitting: *Deleted

## 2015-07-21 DIAGNOSIS — Z79899 Other long term (current) drug therapy: Secondary | ICD-10-CM | POA: Insufficient documentation

## 2015-07-21 DIAGNOSIS — Z792 Long term (current) use of antibiotics: Secondary | ICD-10-CM | POA: Insufficient documentation

## 2015-07-21 DIAGNOSIS — R109 Unspecified abdominal pain: Secondary | ICD-10-CM

## 2015-07-21 DIAGNOSIS — Z87891 Personal history of nicotine dependence: Secondary | ICD-10-CM | POA: Insufficient documentation

## 2015-07-21 DIAGNOSIS — N189 Chronic kidney disease, unspecified: Secondary | ICD-10-CM | POA: Diagnosis not present

## 2015-07-21 DIAGNOSIS — E039 Hypothyroidism, unspecified: Secondary | ICD-10-CM | POA: Insufficient documentation

## 2015-07-21 DIAGNOSIS — N200 Calculus of kidney: Secondary | ICD-10-CM | POA: Insufficient documentation

## 2015-07-21 LAB — BASIC METABOLIC PANEL
Anion gap: 10 (ref 5–15)
BUN: 22 mg/dL — ABNORMAL HIGH (ref 6–20)
CO2: 25 mmol/L (ref 22–32)
Calcium: 8.6 mg/dL — ABNORMAL LOW (ref 8.9–10.3)
Chloride: 104 mmol/L (ref 101–111)
Creatinine, Ser: 1.04 mg/dL — ABNORMAL HIGH (ref 0.44–1.00)
GFR calc Af Amer: >60 mL/min (ref >60)
GFR calc non Af Amer: 56 mL/min — ABNORMAL LOW (ref >60)
Glucose, Bld: 105 mg/dL — ABNORMAL HIGH (ref 65–99)
Potassium: 4 mmol/L (ref 3.5–5.1)
Sodium: 139 mmol/L (ref 135–145)

## 2015-07-21 LAB — URINALYSIS, ROUTINE W REFLEX MICROSCOPIC
Bilirubin Urine: NEGATIVE
Glucose, UA: NEGATIVE mg/dL
Ketones, ur: NEGATIVE mg/dL
Leukocytes, UA: NEGATIVE
Nitrite: NEGATIVE
Protein, ur: NEGATIVE mg/dL
Specific Gravity, Urine: 1.025 (ref 1.005–1.030)
pH: 5 (ref 5.0–8.0)

## 2015-07-21 LAB — CBC WITH DIFFERENTIAL/PLATELET
Basophils Absolute: 0 10*3/uL (ref 0.0–0.1)
Basophils Relative: 1 %
Eosinophils Absolute: 0.2 10*3/uL (ref 0.0–0.7)
Eosinophils Relative: 3 %
HCT: 35.6 % — ABNORMAL LOW (ref 36.0–46.0)
Hemoglobin: 11.4 g/dL — ABNORMAL LOW (ref 12.0–15.0)
Lymphocytes Relative: 38 %
Lymphs Abs: 2.2 10*3/uL (ref 0.7–4.0)
MCH: 26.6 pg (ref 26.0–34.0)
MCHC: 32 g/dL (ref 30.0–36.0)
MCV: 83.2 fL (ref 78.0–100.0)
Monocytes Absolute: 0.4 10*3/uL (ref 0.1–1.0)
Monocytes Relative: 7 %
Neutro Abs: 3.1 10*3/uL (ref 1.7–7.7)
Neutrophils Relative %: 51 %
Platelets: 249 10*3/uL (ref 150–400)
RBC: 4.28 MIL/uL (ref 3.87–5.11)
RDW: 14.9 % (ref 11.5–15.5)
WBC: 5.9 10*3/uL (ref 4.0–10.5)

## 2015-07-21 LAB — URINE MICROSCOPIC-ADD ON

## 2015-07-21 MED ORDER — KETOROLAC TROMETHAMINE 15 MG/ML IJ SOLN
15.0000 mg | Freq: Once | INTRAMUSCULAR | Status: AC
  Administered 2015-07-21: 15 mg via INTRAVENOUS
  Filled 2015-07-21: qty 1

## 2015-07-21 MED ORDER — SODIUM CHLORIDE 0.9 % IV BOLUS (SEPSIS)
1000.0000 mL | Freq: Once | INTRAVENOUS | Status: AC
  Administered 2015-07-21: 1000 mL via INTRAVENOUS

## 2015-07-21 MED ORDER — HYDROMORPHONE HCL 1 MG/ML IJ SOLN
1.0000 mg | Freq: Once | INTRAMUSCULAR | Status: AC
  Administered 2015-07-21: 1 mg via INTRAVENOUS
  Filled 2015-07-21: qty 1

## 2015-07-21 MED ORDER — ONDANSETRON HCL 4 MG/2ML IJ SOLN
4.0000 mg | Freq: Once | INTRAMUSCULAR | Status: AC
  Administered 2015-07-21: 4 mg via INTRAVENOUS
  Filled 2015-07-21: qty 2

## 2015-07-21 NOTE — ED Notes (Signed)
Pt informed of need for urine.

## 2015-07-21 NOTE — ED Provider Notes (Signed)
CSN: [id number]     Arrival date & time 07/21/15  0243 History  By signing my name below, I, Rowan Blase, attest that this documentation has been prepared under the direction and in the presence of Merryl Hacker, MD . Electronically Signed: Rowan Blase, Scribe. 07/21/2015. 3:25 AM.   Chief Complaint  Patient presents with  . Flank Pain   The history is provided by the patient. No language interpreter was used.   HPI Comments:  Jaime Fuller is a 65 y.o. female with PMHx of chronic kidney disease and kidney stones who presents to the Emergency Department complaining of worsening, 4/10 left side flank pain onset yesterday morning, worsening yesterday night. Pt reports associated difficulty urinating and current nausea. Pt had lithotripsy on left kidney ~5 days ago and has been passing microscopic particles. She called her urologist regarding current symptoms, they recommended an increase in pain medication which provided no relief. Pt took Oxycodone every 4 hours yesterday and increased her dose throughout the evening without relief. She also took 2 Benadryl yesterday. Denies fever, other major medical problems, h/o smoking or ETOH use.  Urologist - Dr. Junious Silk  Past Medical History  Diagnosis Date  . Anxiety state, unspecified 09/20/2007  . HYPOTHYROIDISM 03/08/2007  . Pancreatitis     secondary to ERCP  . Chronic kidney disease     kidney stones   Past Surgical History  Procedure Laterality Date  . Gastric bypass    . Cholecystectomy    . Abdominal plasty    . Childbirth x 2    . Wisdom tooth extraction    . Carpel tunnel surgery left hand     Family History  Problem Relation Age of Onset  . Lung cancer Father   . Heart attack Father     Died at 29   Social History  Substance Use Topics  . Smoking status: Former Research scientist (life sciences)  . Smokeless tobacco: Never Used  . Alcohol Use: No   OB History    No data available     Review of Systems  Constitutional: Negative  for fever.  Respiratory: Negative for shortness of breath.   Cardiovascular: Negative for chest pain.  Gastrointestinal: Positive for nausea.  Genitourinary: Positive for flank pain and difficulty urinating.  All other systems reviewed and are negative.   Allergies  Vicodin  Home Medications   Prior to Admission medications   Medication Sig Start Date End Date Taking? Authorizing Provider  cephALEXin (KEFLEX) 500 MG capsule Take 1 capsule (500 mg total) by mouth 2 (two) times daily. 07/15/15  Yes Festus Aloe, MD  clonazePAM (KLONOPIN) 0.5 MG tablet Take 0.5 mg by mouth 2 (two) times daily as needed for anxiety.   Yes Historical Provider, MD  cyanocobalamin (,VITAMIN B-12,) 1000 MCG/ML injection INJECT 1 ML (1,000 MCG TOTAL) INTO THE MUSCLE EVERY 30 (THIRTY) DAYS. 02/21/12  Yes Dorena Cookey, MD  EPINEPHrine (EPIPEN JR) 0.15 MG/0.3ML injection Inject 0.3 mLs (0.15 mg total) into the muscle as needed for anaphylaxis. 02/03/14  Yes Dorena Cookey, MD  naloxone [facility]) 1 MG/ML injection Inject 1 mL (1 mg total) into the vein as needed. 02/03/14  Yes Dorena Cookey, MD  oxyCODONE-acetaminophen (ROXICET) 5-325 MG tablet Take 1-2 tablets by mouth every 6 (six) hours as needed for severe pain. 07/15/15  Yes Festus Aloe, MD  tamsulosin (FLOMAX) 0.4 MG CAPS capsule Take 1 capsule (0.4 mg total) by mouth daily after supper. 07/15/15  Yes Festus Aloe, MD  valACYclovir (VALTREX) 1000 MG tablet ONE TABLET TWICE A DAY X 5 DAYS AS NEEDED FOR OUTBREAK 07/08/14  Yes Dorena Cookey, MD   BP 90/50 mmHg  Pulse 57  Temp(Src) 97.8 F (36.6 C) (Oral)  Resp 18  SpO2 96% Physical Exam  Constitutional: She is oriented to person, place, and time. She appears well-developed and well-nourished.  Uncomfortable appearing  HENT:  Head: Normocephalic and atraumatic.  Eyes: Pupils are equal, round, and reactive to light.  Cardiovascular: Normal rate, regular rhythm and normal heart sounds.    Pulmonary/Chest: Effort normal and breath sounds normal. No respiratory distress. She has no wheezes.  Abdominal: Soft. Bowel sounds are normal. There is no tenderness. There is no rebound.  Genitourinary:  No CVA tenderness  Neurological: She is alert and oriented to person, place, and time.  Skin: Skin is warm and dry.  Psychiatric: She has a normal mood and affect.  Nursing note and vitals reviewed.   ED Course  Procedures  DIAGNOSTIC STUDIES:  Oxygen Saturation is 94% on RA, adequate by my interpretation.    COORDINATION OF CARE:  3:21 AM Will administer nausea medication. Will order CBC, BMP and UA. Discussed treatment plan with pt at bedside and pt agreed to plan.  Labs Review Labs Reviewed  CBC WITH DIFFERENTIAL/PLATELET - Abnormal; Notable for the following:    Hemoglobin 11.4 (*)    HCT 35.6 (*)    All other components within normal limits  BASIC METABOLIC PANEL - Abnormal; Notable for the following:    Glucose, Bld 105 (*)    BUN 22 (*)    Creatinine, Ser 1.04 (*)    Calcium 8.6 (*)    GFR calc non Af Amer 56 (*)    All other components within normal limits  URINALYSIS, ROUTINE W REFLEX MICROSCOPIC (NOT AT [facility]) - Abnormal; Notable for the following:    APPearance CLOUDY (*)    Hgb urine dipstick LARGE (*)    All other components within normal limits  URINE MICROSCOPIC-ADD ON - Abnormal; Notable for the following:    Squamous Epithelial / LPF 0-5 (*)    Bacteria, UA RARE (*)    All other components within normal limits    Imaging Review No results found. I have personally reviewed and evaluated these images and lab results as part of my medical decision-making.   EKG Interpretation None      MDM   Final diagnoses:  Flank pain  Kidney stones    Patient presents with flank pain. Lithotripsy approximate 5 days ago. Worsening pain over the last 24 hours. Uncomfortable appearing. Basic labwork obtained. Patient was given fluids, pain medication. No  evidence of urinary tract infection. Mild AKI. Otherwise labs are reassuring.    6:00 AM On recheck, patient states that she has had full improvement of pain. She states that she went to the restroom and passed what appeared to be a "larger stone." This could be the cause of the patient's pain. Patient was given a small dose of Toradol for spasm. Given that she is improved, will defer imaging at this time. She has a follow-up with her urologist this morning at 8:15. Repeat blood pressure 90/50. Patient has received multiple doses of narcotics at home and additional IV narcotics here. This is likely the culprit. Will finish fluids. She is otherwise asymptomatic. If she continues to be asymptomatic, discharge home.  I personally performed the services described in this documentation, which was scribed in my presence. The recorded  information has been reviewed and is accurate.    Merryl Hacker, MD 07/21/15 [phone]

## 2015-07-21 NOTE — ED Notes (Signed)
MD made aware of BP. Pt is asymptomatic of low BP

## 2015-07-21 NOTE — ED Notes (Signed)
Pt states that she had lithotripsy last Thurs and has been passing particles; pt states that she began to have left sided flank pain yesterday morning; pt states that she called her Urologist and advised to increase pain medication; pt states that it has not helped and she is still having flank pain and difficulty urinating

## 2015-07-21 NOTE — ED Notes (Signed)
Pt able to drink water without difficulty or vomiting

## 2015-07-21 NOTE — Discharge Instructions (Signed)
You were seen today for flank pain. This is likely related to your recent lithotripsy and passing of small stones. Follow-up with urology as scheduled later today.  Kidney Stones Kidney stones (urolithiasis) are deposits that form inside your kidneys. The intense pain is caused by the stone moving through the urinary tract. When the stone moves, the ureter goes into spasm around the stone. The stone is usually passed in the urine.  CAUSES   A disorder that makes certain neck glands produce too much parathyroid hormone (primary hyperparathyroidism).  A buildup of uric acid crystals, similar to gout in your joints.  Narrowing (stricture) of the ureter.  A kidney obstruction present at birth (congenital obstruction).  Previous surgery on the kidney or ureters.  Numerous kidney infections. SYMPTOMS   Feeling sick to your stomach (nauseous).  Throwing up (vomiting).  Blood in the urine (hematuria).  Pain that usually spreads (radiates) to the groin.  Frequency or urgency of urination. DIAGNOSIS   Taking a history and physical exam.  Blood or urine tests.  CT scan.  Occasionally, an examination of the inside of the urinary bladder (cystoscopy) is performed. TREATMENT   Observation.  Increasing your fluid intake.  Extracorporeal shock wave lithotripsy--This is a noninvasive procedure that uses shock waves to break up kidney stones.  Surgery may be needed if you have severe pain or persistent obstruction. There are various surgical procedures. Most of the procedures are performed with the use of small instruments. Only small incisions are needed to accommodate these instruments, so recovery time is minimized. The size, location, and chemical composition are all important variables that will determine the proper choice of action for you. Talk to your health care provider to better understand your situation so that you will minimize the risk of injury to yourself and your kidney.    HOME CARE INSTRUCTIONS   Drink enough water and fluids to keep your urine clear or pale yellow. This will help you to pass the stone or stone fragments.  Strain all urine through the provided strainer. Keep all particulate matter and stones for your health care provider to see. The stone causing the pain may be as small as a grain of salt. It is very important to use the strainer each and every time you pass your urine. The collection of your stone will allow your health care provider to analyze it and verify that a stone has actually passed. The stone analysis will often identify what you can do to reduce the incidence of recurrences.  Only take over-the-counter or prescription medicines for pain, discomfort, or fever as directed by your health care provider.  Keep all follow-up visits as told by your health care provider. This is important.  Get follow-up X-rays if required. The absence of pain does not always mean that the stone has passed. It may have only stopped moving. If the urine remains completely obstructed, it can cause loss of kidney function or even complete destruction of the kidney. It is your responsibility to make sure X-rays and follow-ups are completed. Ultrasounds of the kidney can show blockages and the status of the kidney. Ultrasounds are not associated with any radiation and can be performed easily in a matter of minutes.  Make changes to your daily diet as told by your health care provider. You may be told to:  Limit the amount of salt that you eat.  Eat 5 or more servings of fruits and vegetables each day.  Limit the amount of meat,  poultry, fish, and eggs that you eat.  Collect a 24-hour urine sample as told by your health care provider.You may need to collect another urine sample every 6-12 months. SEEK MEDICAL CARE IF:  You experience pain that is progressive and unresponsive to any pain medicine you have been prescribed. SEEK IMMEDIATE MEDICAL CARE IF:    Pain cannot be controlled with the prescribed medicine.  You have a fever or shaking chills.  The severity or intensity of pain increases over 18 hours and is not relieved by pain medicine.  You develop a new onset of abdominal pain.  You feel faint or pass out.  You are unable to urinate.   This information is not intended to replace advice given to you by your health care provider. Make sure you discuss any questions you have with your health care provider.   Document Released: 02/27/2005 Document Revised: 11/18/2014 Document Reviewed: 07/31/2012 Elsevier Interactive Patient Education [employer].

## 2015-07-26 ENCOUNTER — Telehealth: Payer: Self-pay | Admitting: Family Medicine

## 2015-07-26 ENCOUNTER — Encounter: Payer: Self-pay | Admitting: Family Medicine

## 2015-07-26 ENCOUNTER — Ambulatory Visit (INDEPENDENT_AMBULATORY_CARE_PROVIDER_SITE_OTHER): Payer: BLUE CROSS/BLUE SHIELD | Admitting: Family Medicine

## 2015-07-26 VITALS — BP 112/58 | HR 73 | Temp 98.7°F | Ht 61.0 in | Wt 188.0 lb

## 2015-07-26 DIAGNOSIS — N390 Urinary tract infection, site not specified: Secondary | ICD-10-CM | POA: Diagnosis not present

## 2015-07-26 DIAGNOSIS — R079 Chest pain, unspecified: Secondary | ICD-10-CM | POA: Diagnosis not present

## 2015-07-26 LAB — POC URINALSYSI DIPSTICK (AUTOMATED)
Glucose, UA: NEGATIVE
Ketones, UA: NEGATIVE
Leukocytes, UA: NEGATIVE
Protein, UA: NEGATIVE
Spec Grav, UA: >=1.03
Urobilinogen, UA: 0.2
pH, UA: 6

## 2015-07-26 MED ORDER — NITROFURANTOIN MONOHYD MACRO 100 MG PO CAPS: 100.0000 mg | ORAL_CAPSULE | Freq: Two times a day (BID) | ORAL | Status: DC

## 2015-07-26 NOTE — Telephone Encounter (Signed)
Patient Name: Jaime Fuller  DOB: 08-26-1950    Initial Comment Caller States she is having back pain, shoulder blades, chest pain feels like a tightness and a burning   Nurse Assessment  Nurse: Leilani Merl, RN, [place] Date/Time (Eastern Time): 07/26/2015 8:44:27 AM  Confirm and document reason for call. If symptomatic, describe symptoms. You must click the next button to save text entered. ---Caller States she is having back pain, shoulder blades, chest pain feels like a tightness and a burning. This started about 06/15/15, she has been seen and was diagnosed with a kidney stone, but was told that the stone should not cause the chest pain.  Has the patient traveled out of the country within the last 30 days? ---Not Applicable  Does the patient have any new or worsening symptoms? ---Yes  Will a triage be completed? ---Yes  Related visit to physician within the last 2 weeks? ---Yes  Does the PT have any chronic conditions? (i.e. diabetes, asthma, etc.) ---Yes  List chronic conditions. ---See MR  Is this a behavioral health or substance abuse call? ---No     Guidelines    Guideline Title Affirmed Question Affirmed Notes  Chest Pain Pain also present in shoulder(s) or arm(s) or jaw (Exception: pain is clearly made worse by movement)    Final Disposition User   Go to ED [facility] [place], RN, [place]    Referrals  [facility] - ED   Disagree/Comply: Comply

## 2015-07-26 NOTE — Telephone Encounter (Signed)
Spoke to the pt.  She has chest pain when laying down or laying on right side.  Happens also when she is not sitting or stand straight.  Ongoing since April.  Denied SOB, dizziness (except when on Toradol) and fatigue.  Pt was told to go to the ED. Pt does not want to go.  Scheduled her to see Dr. Sarajane Jews today.

## 2015-07-26 NOTE — Telephone Encounter (Signed)
[facility] - Client [place] Patient Name: Jaime Fuller Gender: Female DOB: 01-14-1951 Age: 65 Y 61 M 12 D Return Phone Number: [phone] (Primary), [phone] (Secondary) Address: City/State/Zip: [place] Client [facility] - Client Client Site [place] - Day Physician Christie Nottingham - MD Contact Type Call Who Is Calling Patient / Member / Family / Caregiver Call Type Triage / Clinical Relationship To Patient Self Return Phone Number [phone] (Primary) Chief Complaint CHEST PAIN (>=21 years) - pain, pressure, heaviness or tightness Reason for Call Symptomatic / Request for [place] she is having back pain, shoulder blades, chest pain feels like a tightness and a burning Appointment Disposition EMR Appointment Not Necessary Info pasted into Epic Yes PreDisposition Call Doctor Translation No Nurse Assessment Nurse: Leilani Merl, RN, Nira Conn Date/Time (Eastern Time): 07/26/2015 8:44:27 AM Confirm and document reason for call. If symptomatic, describe symptoms. You must click the next button to save text entered. ---Caller States she is having back pain, shoulder blades, chest pain feels like a tightness and a burning. This started about 06/15/15, she has been seen and was diagnosed with a kidney stone, but was told that the stone should not cause the chest pain. Has the patient traveled out of the country within the last 30 days? ---Not Applicable Does the patient have any new or worsening symptoms? ---Yes Will a triage be completed? ---Yes Related visit to physician within the last 2 weeks? ---Yes Does the PT have any chronic conditions? (i.e. diabetes, asthma, etc.) ---Yes List chronic conditions. ---See MR Is this a behavioral health or substance abuse call? ---No Guidelines Guideline Title Affirmed Question Affirmed  Notes Nurse Date/Time (Eastern Time) Chest Pain Pain also present in shoulder(s) or arm(s) or jaw (Exception: pain is Facilities manager, RN, Nira Conn 07/26/2015 8:45:40 AM PLEASE NOTE: All timestamps contained within this report are represented as [place] Standard Time. CONFIDENTIALTY NOTICE: This fax transmission is intended only for the addressee. It contains information that is legally privileged, confidential or otherwise protected from use or disclosure. If you are not the intended recipient, you are strictly prohibited from reviewing, disclosing, copying using or disseminating any of this information or taking any action in reliance on or regarding this information. If you have received this fax in error, please notify us immediately by telephone so that we can arrange for its return to [place]. Phone: [phone], Toll-Free: [phone], Fax: [phone] Page: 2 of 2 Call Id: [id number] Guidelines Guideline Title Affirmed Question Affirmed Notes Nurse Date/Time Eilene Ghazi Time) clearly made worse by movement) Disp. Time Eilene Ghazi Time) Disposition Final User 07/26/2015 8:39:01 AM Send To Call Back Waiting For Nurse Antonietta Barcelona 07/26/2015 8:39:01 AM Send To Call Back Waiting For Nurse Antonietta Barcelona 07/26/2015 8:39:01 AM Send To Call Back Waiting For Nurse Antonietta Barcelona 07/26/2015 8:39:01 AM Send To Call Back Waiting For Nurse Antonietta Barcelona 07/26/2015 8:39:07 AM Send to Urgent Queue Antonietta Barcelona 07/26/2015 8:57:31 AM Call Completed Standifer, RN, Nira Conn 07/26/2015 8:55:05 AM Go to ED Now Yes Standifer, RN, Soyla Murphy Understands: Yes Disagree/Comply: Comply Care Advice Given Per Guideline GO TO ED NOW: You need to be seen in the Emergency Department. Go to the ER at ___________ [facility] now. Drive carefully. * Another adult should drive. CARE ADVICE given per Chest Pain (Adult) guideline. Referrals [facility] - [facility]

## 2015-07-26 NOTE — Progress Notes (Signed)
   Subjective:    Patient ID: Jaime Fuller, female    DOB: 09/15/1950, 65 y.o.   MRN: [id number]  HPI Here for 4 days of intermittent mild sharp pains in the left upper back around the shoulder blade and around the left chest. These are not related to exertion, in fact they hurt worst when lying in bed. She feels better when up moving around. No SOB or cough. No GERD symptoms. In fact she has has taken both Omeprazole and Nexium OTC with no relief. She had lithotripsy on 07-15-15 for a left sided kidney stone. She has been passing multiple small stones since then. Her urine had been clear but today it seemed darker than usual. No burning on urination. BMs are normal. She notes that when lying in bed the pain is worse when lying flat on her back or on the left side, it feels better when lying on the right side.    Review of Systems  Constitutional: Negative.   Respiratory: Negative.   Cardiovascular: Positive for chest pain. Negative for palpitations and leg swelling.  Gastrointestinal: Negative for nausea, vomiting, abdominal pain, diarrhea, constipation, blood in stool, abdominal distention, anal bleeding and rectal pain.  Endocrine: Negative.   Genitourinary: Positive for urgency and frequency. Negative for dysuria, hematuria and flank pain.  Musculoskeletal: Positive for back pain.       Objective:   Physical Exam  Constitutional: She is oriented to person, place, and time. She appears well-developed and well-nourished. No distress.  Cardiovascular: Normal rate, regular rhythm, normal heart sounds and intact distal pulses.   EKG normal   Pulmonary/Chest: Effort normal and breath sounds normal. No respiratory distress. She has no wheezes. She has no rales.  Tender along the left sternal margin   Abdominal: Soft. Bowel sounds are normal. She exhibits no distension and no mass. There is no tenderness. There is no rebound and no guarding.  Musculoskeletal: She exhibits no edema.  Tender on  the left upper back between the scapula and spine, full ROM   Neurological: She is alert and oriented to person, place, and time.          Assessment & Plan:  She seems to have a UTI, probably related her recently passing stones. Treat with Macrobid. Drink plenty of water. She also has muscular chest and back pain. Treat with Aleve bid prn.  Laurey Morale, MD

## 2015-07-26 NOTE — Progress Notes (Signed)
Pre visit review using our clinic review tool, if applicable. No additional management support is needed unless otherwise documented below in the visit note. 

## 2015-07-26 NOTE — Addendum Note (Signed)
Addended by: Aggie Hacker A on: 07/26/2015 04:05 PM   Modules accepted: Orders

## 2015-07-28 LAB — URINE CULTURE: Colony Count: >=100000

## 2016-01-07 ENCOUNTER — Ambulatory Visit (INDEPENDENT_AMBULATORY_CARE_PROVIDER_SITE_OTHER)
Admission: RE | Admit: 2016-01-07 | Discharge: 2016-01-07 | Disposition: A | Discharge status: Home / Self Care | Payer: BLUE CROSS/BLUE SHIELD | Source: Ambulatory Visit | Attending: Family Medicine | Admitting: Family Medicine

## 2016-01-07 ENCOUNTER — Ambulatory Visit (INDEPENDENT_AMBULATORY_CARE_PROVIDER_SITE_OTHER): Payer: BLUE CROSS/BLUE SHIELD | Admitting: Family Medicine

## 2016-01-07 ENCOUNTER — Encounter: Payer: Self-pay | Admitting: Family Medicine

## 2016-01-07 ENCOUNTER — Telehealth: Payer: Self-pay | Admitting: Family Medicine

## 2016-01-07 VITALS — BP 116/70 | HR 87 | Temp 98.9°F | Ht 61.0 in | Wt 169.4 lb

## 2016-01-07 DIAGNOSIS — J988 Other specified respiratory disorders: Secondary | ICD-10-CM | POA: Diagnosis not present

## 2016-01-07 DIAGNOSIS — R05 Cough: Secondary | ICD-10-CM

## 2016-01-07 DIAGNOSIS — R8290 Unspecified abnormal findings in urine: Secondary | ICD-10-CM

## 2016-01-07 DIAGNOSIS — R059 Cough, unspecified: Secondary | ICD-10-CM

## 2016-01-07 LAB — POCT URINALYSIS DIPSTICK
Bilirubin, UA: NEGATIVE
Glucose, UA: NEGATIVE
Ketones, UA: NEGATIVE
Leukocytes, UA: NEGATIVE
Nitrite, UA: POSITIVE
Protein, UA: 15
Spec Grav, UA: >=1.03
Urobilinogen, UA: 0.2
pH, UA: 6

## 2016-01-07 LAB — URINALYSIS, ROUTINE W REFLEX MICROSCOPIC
Bilirubin Urine: NEGATIVE
Ketones, ur: 40 — AB
Nitrite: POSITIVE — AB
Specific Gravity, Urine: 1.015 (ref 1.000–1.030)
Urine Glucose: NEGATIVE
Urobilinogen, UA: 0.2 (ref 0.0–1.0)
pH: 6 (ref 5.0–8.0)

## 2016-01-07 LAB — POCT INFLUENZA A/B: Influenza A, POC: NEGATIVE

## 2016-01-07 MED ORDER — DOXYCYCLINE HYCLATE 100 MG PO CAPS: 100.0000 mg | ORAL_CAPSULE | Freq: Two times a day (BID) | ORAL | 0 refills | Status: DC

## 2016-01-07 MED ORDER — AMOXICILLIN-POT CLAVULANATE 875-125 MG PO TABS: 1.0000 | ORAL_TABLET | Freq: Two times a day (BID) | ORAL | 0 refills | Status: DC

## 2016-01-07 NOTE — Progress Notes (Addendum)
HPI:  Resp Inf: -started: 11 days ago, she feels was the flu and was worsening, feels a little better today -symptoms:nasal congestion - now thick, fevers up to 102, sore throat, cough, PND, occ frontal HA, diarrhea - now resolved -denies:SOB, neck stiffness, worsening headache, productive cough, rash, tick bite, recent foreign travel, dysuria, flank pain, nausea or vomiting -she wants a urine test because has urine odor - no other urinary symptoms -sick contacts/travel/risks: no reported flu, strep or tick exposure  ROS: See pertinent positives and negatives per HPI.  Past Medical History:  Diagnosis Date  . Anxiety state, unspecified 09/20/2007  . Chronic kidney disease    kidney stones  . HYPOTHYROIDISM 03/08/2007  . Pancreatitis    secondary to ERCP    Past Surgical History:  Procedure Laterality Date  . abdominal plasty    . Carpel tunnel surgery left hand    . Childbirth x 2    . CHOLECYSTECTOMY    . GASTRIC BYPASS    . WISDOM TOOTH EXTRACTION      Family History  Problem Relation Age of Onset  . Lung cancer Father   . Heart attack Father     Died at 31    Social History   Social History  . Marital status: Married    Spouse name: N/A  . Number of children: N/A  . Years of education: N/A   Social History Main Topics  . Smoking status: Former Research scientist (life sciences)  . Smokeless tobacco: Never Used  . Alcohol use No  . Drug use: No  . Sexual activity: Not Asked   Other Topics Concern  . None   Social History Narrative  . None     Current Outpatient Prescriptions:  .  clonazePAM (KLONOPIN) 0.5 MG tablet, Take 0.5 mg by mouth 2 (two) times daily as needed for anxiety., Disp: , Rfl:  .  cyanocobalamin (,VITAMIN B-12,) 1000 MCG/ML injection, INJECT 1 ML (1,000 MCG TOTAL) INTO THE MUSCLE EVERY 30 (THIRTY) DAYS., Disp: 30 mL, Rfl: 11 .  EPINEPHrine (EPIPEN JR) 0.15 MG/0.3ML injection, Inject 0.3 mLs (0.15 mg total) into the muscle as needed for anaphylaxis., Disp: 2  each, Rfl: 3 .  valACYclovir (VALTREX) 1000 MG tablet, ONE TABLET TWICE A DAY X 5 DAYS AS NEEDED FOR OUTBREAK, Disp: 20 tablet, Rfl: 2 .  amoxicillin-clavulanate (AUGMENTIN) 875-125 MG tablet, Take 1 tablet by mouth 2 (two) times daily., Disp: 20 tablet, Rfl: 0 .  doxycycline (VIBRAMYCIN) 100 MG capsule, Take 1 capsule (100 mg total) by mouth 2 (two) times daily., Disp: 20 capsule, Rfl: 0  EXAM:  Vitals:   01/07/16 1303  BP: 116/70  Pulse: 87  Temp: 98.9 F (37.2 C)    Body mass index is 32.01 kg/m.  GENERAL: vitals reviewed and listed above, alert, oriented, appears well hydrated and in no acute distress  HEENT: atraumatic, conjunttiva clear, no obvious abnormalities on inspection of external nose and ears, normal appearance of ear canals and TMs, clear nasal congestion, mild post oropharyngeal erythema with PND, no tonsillar edema or exudate, no sinus TTP  NECK: no obvious masses on inspection  LUNGS: clear to auscultation bilaterally, no wheezes, rales or rhonchi, good air movement  CV: HRRR, no peripheral edema  ABD: no CVA TTP  MS: moves all extremities without noticeable abnormality  PSYCH: pleasant and cooperative, no obvious depression or anxiety  ASSESSMENT AND PLAN:  Discussed the following assessment and plan:  Respiratory infection - Plan: POC Influenza A/B  Abnormal  urine odor - Plan: POC Urinalysis Dipstick, Urinalysis with Reflex Microscopic  Cough - Plan: DG Chest 2 View  Given HPI and exam findings today, a serious infection or illness is unlikely. We discussed potential etiologies, with VURI or flu with possible secondary sinusitis being most likely. She wants urine studies, flu test and CXR - ordered. Opted to start Augmentin to cover for sinusitis/resp infection and/or UTI based on prior sensitivities. If not improving would do levaquin. Sent doxy initially by mistake and pt aware/ We discussed treatment side effects, likely course, antibiotic misuse,  transmission, and signs of developing a serious illness. -of course, we advised to return or notify a doctor immediately if symptoms worsen or persist or new concerns arise.    Patient Instructions  BEFORE YOU LEAVE: -flu test -UA with reflex if abnormal -xray sheet  Start the antibiotic.  See separate sheet for details xray.  Seek care immediatly if worsening, new concerns or not improving on treatment.   Colin Benton R., DO

## 2016-01-07 NOTE — Progress Notes (Signed)
Pre visit review using our clinic review tool, if applicable. No additional management support is needed unless otherwise documented below in the visit note. 

## 2016-01-07 NOTE — Patient Instructions (Signed)
BEFORE YOU LEAVE: -flu test -UA with reflex if abnormal -xray sheet  Start the antibiotic.  See separate sheet for details xray.  Seek care immediatly if worsening, new concerns or not improving on treatment.

## 2016-01-07 NOTE — Addendum Note (Signed)
Addended by: Lucretia Kern on: 01/07/2016 04:50 PM   Modules accepted: Orders

## 2016-01-07 NOTE — Addendum Note (Signed)
Addended by: Agnes Lawrence on: 01/07/2016 01:48 PM   Modules accepted: Orders

## 2016-01-07 NOTE — Addendum Note (Signed)
Addended by: Agnes Lawrence on: 01/07/2016 01:50 PM   Modules accepted: Orders

## 2016-01-07 NOTE — Telephone Encounter (Signed)
Pt would like to know if she was to get two prescriptions or one b/c when she got to the pharmacy it was only one.  If she is only to have one don't worry about contacting her.

## 2016-01-10 ENCOUNTER — Other Ambulatory Visit: Payer: Self-pay | Admitting: *Deleted

## 2016-01-10 MED ORDER — LEVOFLOXACIN 750 MG PO TABS: 750.0000 mg | ORAL_TABLET | Freq: Every day | ORAL | 0 refills | Status: DC

## 2016-01-10 NOTE — Telephone Encounter (Signed)
Rx done. 

## 2016-01-12 ENCOUNTER — Other Ambulatory Visit: Payer: Self-pay | Admitting: Family Medicine

## 2016-01-12 DIAGNOSIS — Z1231 Encounter for screening mammogram for malignant neoplasm of breast: Secondary | ICD-10-CM

## 2016-01-28 ENCOUNTER — Ambulatory Visit: Payer: BLUE CROSS/BLUE SHIELD

## 2016-03-07 ENCOUNTER — Ambulatory Visit
Admission: RE | Admit: 2016-03-07 | Discharge: 2016-03-07 | Disposition: A | Discharge status: Home / Self Care | Payer: BLUE CROSS/BLUE SHIELD | Source: Ambulatory Visit | Attending: Family Medicine | Admitting: Family Medicine

## 2016-03-07 DIAGNOSIS — Z1231 Encounter for screening mammogram for malignant neoplasm of breast: Secondary | ICD-10-CM

## 2016-03-24 ENCOUNTER — Ambulatory Visit (INDEPENDENT_AMBULATORY_CARE_PROVIDER_SITE_OTHER): Payer: BLUE CROSS/BLUE SHIELD

## 2016-03-24 DIAGNOSIS — Z23 Encounter for immunization: Secondary | ICD-10-CM

## 2016-03-30 ENCOUNTER — Other Ambulatory Visit: Payer: Self-pay | Admitting: Family Medicine

## 2016-04-27 ENCOUNTER — Ambulatory Visit (INDEPENDENT_AMBULATORY_CARE_PROVIDER_SITE_OTHER): Payer: BLUE CROSS/BLUE SHIELD | Admitting: Family Medicine

## 2016-04-27 VITALS — BP 100/62 | HR 71 | Temp 98.2°F | Ht 61.0 in | Wt 179.6 lb

## 2016-04-27 DIAGNOSIS — B9789 Other viral agents as the cause of diseases classified elsewhere: Secondary | ICD-10-CM

## 2016-04-27 DIAGNOSIS — J069 Acute upper respiratory infection, unspecified: Secondary | ICD-10-CM

## 2016-04-27 LAB — POC INFLUENZA A&B (BINAX/QUICKVUE)
Influenza A, POC: NEGATIVE
Influenza B, POC: NEGATIVE

## 2016-04-27 NOTE — Progress Notes (Signed)
HPI:  URI: -started: yesterday -symptoms: sore throat , pnd -denies:fever, SOB, NVD, tooth pain, body aches -has tried: nothing -sick contacts/travel/risks: no reported flu, strep or tick exposure -symptoms are mild but she wants flu testing - she has a sick spouse and wants to ensure doe snot have the flu  ROS: See pertinent positives and negatives per HPI.  Past Medical History:  Diagnosis Date  . Anxiety state, unspecified 09/20/2007  . Chronic kidney disease    kidney stones  . HYPOTHYROIDISM 03/08/2007  . Pancreatitis    secondary to ERCP    Past Surgical History:  Procedure Laterality Date  . abdominal plasty    . Carpel tunnel surgery left hand    . Childbirth x 2    . CHOLECYSTECTOMY    . GASTRIC BYPASS    . WISDOM TOOTH EXTRACTION      Family History  Problem Relation Age of Onset  . Lung cancer Father   . Heart attack Father     Died at 88    Social History   Social History  . Marital status: Married    Spouse name: N/A  . Number of children: N/A  . Years of education: N/A   Social History Main Topics  . Smoking status: Former Research scientist (life sciences)  . Smokeless tobacco: Never Used  . Alcohol use No  . Drug use: No  . Sexual activity: Not on file   Other Topics Concern  . Not on file   Social History Narrative  . No narrative on file     Current Outpatient Prescriptions:  .  clonazePAM (KLONOPIN) 0.5 MG tablet, Take 0.5 mg by mouth 2 (two) times daily as needed for anxiety., Disp: , Rfl:  .  cyanocobalamin (,VITAMIN B-12,) 1000 MCG/ML injection, INJECT 1 ML (1,000 MCG TOTAL) INTO THE MUSCLE EVERY 30 (THIRTY) DAYS., Disp: 30 mL, Rfl: 11 .  EPINEPHrine (EPIPEN JR) 0.15 MG/0.3ML injection, Inject 0.3 mLs (0.15 mg total) into the muscle as needed for anaphylaxis., Disp: 2 each, Rfl: 3 .  valACYclovir (VALTREX) 1000 MG tablet, ONE TABLET TWICE A DAY X 5 DAYS AS NEEDED FOR OUTBREAK, Disp: 50 tablet, Rfl: 4  EXAM:  Vitals:   04/27/16 1427  BP: 100/62    Pulse: 71  Temp: 98.2 F (36.8 C)    Body mass index is 33.94 kg/m.  GENERAL: vitals reviewed and listed above, alert, oriented, appears well hydrated and in no acute distress  HEENT: atraumatic, conjunttiva clear, no obvious abnormalities on inspection of external nose and ears, normal appearance of ear canals and TMs, clear nasal congestion, mild post oropharyngeal erythema with PND, no tonsillar edema or exudate, no sinus TTP  NECK: no obvious masses on inspection  LUNGS: clear to auscultation bilaterally, no wheezes, rales or rhonchi, good air movement  CV: HRRR, no peripheral edema  MS: moves all extremities without noticeable abnormality  PSYCH: pleasant and cooperative, no obvious depression or anxiety  ASSESSMENT AND PLAN:  Discussed the following assessment and plan:  Viral upper respiratory illness  -given HPI and exam findings today, a serious infection or illness is unlikely. We discussed potential etiologies, with VURI being most likely, and advised supportive care and monitoring. Rapid flu test per her preference - discussed limitations of this test. We discussed treatment side effects, likely course, antibiotic misuse, transmission, and signs of developing a serious illness. -of course, we advised to return or notify a doctor immediately if symptoms worsen or persist or new concerns arise.  Patient Instructions  BEFORE YOU LEAVE: -rapid flu test   INSTRUCTIONS FOR UPPER RESPIRATORY INFECTION:  -plenty of rest and fluids  -nasal saline wash 2-3 times daily (use prepackaged nasal saline or bottled/distilled water if making your own)   -can use tylenol (in no history of liver disease) or ibuprofen (if no history of kidney disease, bowel bleeding or significant heart disease) as directed for aches and sorethroat  -in the winter time, using a humidifier at night is helpful (please follow cleaning instructions)  -if you are taking a cough medication - use  only as directed, may also try a teaspoon of honey to coat the throat and throat lozenges.   -for sore throat, salt water gargles can help  -follow up if you have fevers, facial pain, tooth pain, difficulty breathing or are worsening or symptoms persist longer then expected  Upper Respiratory Infection, Adult An upper respiratory infection (URI) is also known as the common cold. It is often caused by a type of germ (virus). Colds are easily spread (contagious). You can pass it to others by kissing, coughing, sneezing, or drinking out of the same glass. Usually, you get better in 1 to 3  weeks.  However, the cough can last for even longer. HOME CARE   Only take medicine as told by your doctor. Follow instructions provided above.  Drink enough water and fluids to keep your pee (urine) clear or pale yellow.  Get plenty of rest.  Return to work when your temperature is < 100 for 24 hours or as told by your doctor. You may use a face mask and wash your hands to stop your cold from spreading. GET HELP RIGHT AWAY IF:   After the first few days, you feel you are getting worse.  You have questions about your medicine.  You have chills, shortness of breath, or red spit (mucus).  You have pain in the face for more then 1-2 days, especially when you bend forward.  You have a fever, puffy (swollen) neck, pain when you swallow, or white spots in the back of your throat.  You have a bad headache, ear pain, sinus pain, or chest pain.  You have a high-pitched whistling sound when you breathe in and out (wheezing).  You cough up blood.  You have sore muscles or a stiff neck. MAKE SURE YOU:   Understand these instructions.  Will watch your condition.  Will get help right away if you are not doing well or get worse. Document Released: 08/16/2007 Document Revised: 05/22/2011 Document Reviewed: 06/04/2013 [facility] Patient Information 2015 [place], [place]. This information is not intended to  replace advice given to you by your health care provider. Make sure you discuss any questions you have with your health care provider.       Colin Benton R., DO

## 2016-04-27 NOTE — Patient Instructions (Addendum)
BEFORE YOU LEAVE: -rapid flu test   INSTRUCTIONS FOR UPPER RESPIRATORY INFECTION:  -plenty of rest and fluids  -nasal saline wash 2-3 times daily (use prepackaged nasal saline or bottled/distilled water if making your own)   -can use tylenol (in no history of liver disease) or ibuprofen (if no history of kidney disease, bowel bleeding or significant heart disease) as directed for aches and sorethroat  -in the winter time, using a humidifier at night is helpful (please follow cleaning instructions)  -if you are taking a cough medication - use only as directed, may also try a teaspoon of honey to coat the throat and throat lozenges.   -for sore throat, salt water gargles can help  -follow up if you have fevers, facial pain, tooth pain, difficulty breathing or are worsening or symptoms persist longer then expected  Upper Respiratory Infection, Adult An upper respiratory infection (URI) is also known as the common cold. It is often caused by a type of germ (virus). Colds are easily spread (contagious). You can pass it to others by kissing, coughing, sneezing, or drinking out of the same glass. Usually, you get better in 1 to 3  weeks.  However, the cough can last for even longer. HOME CARE   Only take medicine as told by your doctor. Follow instructions provided above.  Drink enough water and fluids to keep your pee (urine) clear or pale yellow.  Get plenty of rest.  Return to work when your temperature is < 100 for 24 hours or as told by your doctor. You may use a face mask and wash your hands to stop your cold from spreading. GET HELP RIGHT AWAY IF:   After the first few days, you feel you are getting worse.  You have questions about your medicine.  You have chills, shortness of breath, or red spit (mucus).  You have pain in the face for more then 1-2 days, especially when you bend forward.  You have a fever, puffy (swollen) neck, pain when you swallow, or white spots in the  back of your throat.  You have a bad headache, ear pain, sinus pain, or chest pain.  You have a high-pitched whistling sound when you breathe in and out (wheezing).  You cough up blood.  You have sore muscles or a stiff neck. MAKE SURE YOU:   Understand these instructions.  Will watch your condition.  Will get help right away if you are not doing well or get worse. Document Released: 08/16/2007 Document Revised: 05/22/2011 Document Reviewed: 06/04/2013 [facility] Patient Information 2015 [place], [place]. This information is not intended to replace advice given to you by your health care provider. Make sure you discuss any questions you have with your health care provider.

## 2016-04-27 NOTE — Addendum Note (Signed)
Addended by: Agnes Lawrence on: 04/27/2016 02:58 PM   Modules accepted: Orders

## 2016-04-27 NOTE — Progress Notes (Signed)
Pre visit review using our clinic review tool, if applicable. No additional management support is needed unless otherwise documented below in the visit note. 

## 2016-09-24 ENCOUNTER — Ambulatory Visit (HOSPITAL_COMMUNITY)
Admission: EM | Admit: 2016-09-24 | Discharge: 2016-09-24 | Disposition: A | Discharge status: Home / Self Care | Payer: BLUE CROSS/BLUE SHIELD | Attending: Physician Assistant | Admitting: Physician Assistant

## 2016-09-24 ENCOUNTER — Encounter (HOSPITAL_COMMUNITY): Payer: Self-pay | Admitting: Family Medicine

## 2016-09-24 DIAGNOSIS — S80861A Insect bite (nonvenomous), right lower leg, initial encounter: Secondary | ICD-10-CM

## 2016-09-24 DIAGNOSIS — R5383 Other fatigue: Secondary | ICD-10-CM

## 2016-09-24 DIAGNOSIS — R509 Fever, unspecified: Secondary | ICD-10-CM | POA: Diagnosis not present

## 2016-09-24 DIAGNOSIS — R51 Headache: Secondary | ICD-10-CM | POA: Diagnosis not present

## 2016-09-24 DIAGNOSIS — W57XXXA Bitten or stung by nonvenomous insect and other nonvenomous arthropods, initial encounter: Secondary | ICD-10-CM

## 2016-09-24 MED ORDER — DOXYCYCLINE HYCLATE 100 MG PO CAPS: 100.0000 mg | ORAL_CAPSULE | Freq: Two times a day (BID) | ORAL | 0 refills | Status: DC

## 2016-09-24 NOTE — Discharge Instructions (Signed)
Treating you for both Lyme and RMSF. If your symptoms persist f/u with your PCP to be tested and another week of antibiotics may be needed if you are positive. Feel better.

## 2016-09-24 NOTE — ED Provider Notes (Signed)
CSN: [id number]     Arrival date & time 09/24/16  1942 History   None    Chief Complaint  Patient presents with  . Insect Bite  . Fever   (Consider location/radiation/quality/duration/timing/severity/associated sxs/prior Treatment) 66 yo presents following a tick bite on Monday and finding another tick attached to her right leg on Tuesday. She started feeling febrile yesterday. No rashes or joint pain. Some generalized achiness is noted. She has a mild headache without changes in vision or weakness. No cough or congestion.       Past Medical History:  Diagnosis Date  . Anxiety state, unspecified 09/20/2007  . Chronic kidney disease    kidney stones  . HYPOTHYROIDISM 03/08/2007  . Pancreatitis    secondary to ERCP   Past Surgical History:  Procedure Laterality Date  . abdominal plasty    . Carpel tunnel surgery left hand    . Childbirth x 2    . CHOLECYSTECTOMY    . GASTRIC BYPASS    . WISDOM TOOTH EXTRACTION     Family History  Problem Relation Age of Onset  . Lung cancer Father   . Heart attack Father        Died at 20   Social History  Substance Use Topics  . Smoking status: Former Research scientist (life sciences)  . Smokeless tobacco: Never Used  . Alcohol use No   OB History    No data available     Review of Systems  Constitutional: Positive for fatigue and fever. Negative for chills.  HENT: Negative.   Respiratory: Negative.   Neurological: Negative.     Allergies  Vicodin [hydrocodone-acetaminophen]  Home Medications   Prior to Admission medications   Medication Sig Start Date End Date Taking? Authorizing Provider  clonazePAM (KLONOPIN) 0.5 MG tablet Take 0.5 mg by mouth 2 (two) times daily as needed for anxiety.    [provider]  cyanocobalamin (,VITAMIN B-12,) 1000 MCG/ML injection INJECT 1 ML (1,000 MCG TOTAL) INTO THE MUSCLE EVERY 30 (THIRTY) DAYS. 02/21/12   Dorena Cookey, MD  doxycycline (VIBRAMYCIN) 100 MG capsule Take 1 capsule (100 mg total) by mouth  2 (two) times daily. 09/24/16   Bjorn Pippin, PA-C  EPINEPHrine (EPIPEN JR) 0.15 MG/0.3ML injection Inject 0.3 mLs (0.15 mg total) into the muscle as needed for anaphylaxis. 02/03/14   Dorena Cookey, MD  valACYclovir (VALTREX) 1000 MG tablet ONE TABLET TWICE A DAY X 5 DAYS AS NEEDED FOR OUTBREAK 04/03/16   Dorena Cookey, MD   Meds Ordered and Administered this Visit  Medications - No data to display  BP (!) 126/55   Pulse 89   Temp 99.9 F (37.7 C)   Resp 18   SpO2 100%  No data found.   Physical Exam  Constitutional: She is oriented to person, place, and time. She appears well-developed and well-nourished. No distress.  Musculoskeletal: Normal range of motion. She exhibits no edema.  Neurological: She is alert and oriented to person, place, and time. Coordination normal.  Skin: Skin is warm and dry. She is not diaphoretic.  Psychiatric: Her behavior is normal.  Nursing note and vitals reviewed.   Urgent Care Course     Procedures (including critical care time)  Labs Review Labs Reviewed - No data to display  Imaging Review No results found.   Visual Acuity Review  Right Eye Distance:   Left Eye Distance:   Bilateral Distance:    Right Eye Near:   Left Eye Near:  Bilateral Near:         MDM   1. Tick bite, initial encounter   2. Fever, unspecified    Empiric Doxycycline for 10 days. If symptoms persist then f/u with PCP for actual titer and possibly another 1-2 weeks of treatment if positive.     Bjorn Pippin, [place] 09/24/16 2027

## 2016-09-24 NOTE — ED Triage Notes (Signed)
Pt here for fever, body aches after tick bite. No rashes.

## 2016-10-18 ENCOUNTER — Other Ambulatory Visit: Payer: Self-pay | Admitting: Family Medicine

## 2016-10-18 ENCOUNTER — Telehealth: Payer: Self-pay | Admitting: Family Medicine

## 2016-10-18 NOTE — Telephone Encounter (Signed)
Patient Name: Jaime Fuller  DOB: [birth year]    Initial Comment Caller states she is out of town with fever blisters, needs new rx for valtrex   Nurse Assessment      Guidelines    Guideline Title Affirmed Question Affirmed Notes       Final Disposition User   Clinical Call [place], RN, Conservator, museum/gallery states that she has figured out what she needed and she does not need to speak with a nurse at this time, she will call back if needed.

## 2016-10-18 NOTE — Telephone Encounter (Signed)
Noted  

## 2016-10-18 NOTE — Telephone Encounter (Signed)
Spoke with pt stated that the  Rx has already been taking care of and does not need anything at this time.

## 2016-10-18 NOTE — Telephone Encounter (Signed)
Pt is calling to see if she can get a new Rx valacyclovir sent to the pharmacy in [facility] pt state that she does not have any more refills on this medication and would like to have it for the cold sore that she has.  Pharmacy is at [facility] did not give me the name of the pharmacy got upset b/c Dr. Sherren Mocha is not in the office and she is aware that she may need to be seen.

## 2016-10-18 NOTE — Telephone Encounter (Signed)
Per [facility] the pt does not need a refill any longer.  Final Disposition User   Clinical Call [place], RN, [place]    Comments  Caller states that she has figured out what she needed and she does not need to speak with a nurse at this time, she will call back if needed.    Nothing further needed at this time.

## 2016-10-26 ENCOUNTER — Ambulatory Visit (INDEPENDENT_AMBULATORY_CARE_PROVIDER_SITE_OTHER): Payer: BLUE CROSS/BLUE SHIELD | Admitting: Family Medicine

## 2016-10-26 ENCOUNTER — Other Ambulatory Visit: Payer: Self-pay | Admitting: *Deleted

## 2016-10-26 ENCOUNTER — Encounter: Payer: Self-pay | Admitting: Family Medicine

## 2016-10-26 VITALS — BP 108/70 | HR 61 | Temp 98.5°F | Ht 61.0 in | Wt 184.4 lb

## 2016-10-26 DIAGNOSIS — R202 Paresthesia of skin: Secondary | ICD-10-CM | POA: Diagnosis not present

## 2016-10-26 DIAGNOSIS — E039 Hypothyroidism, unspecified: Secondary | ICD-10-CM | POA: Diagnosis not present

## 2016-10-26 DIAGNOSIS — Z1211 Encounter for screening for malignant neoplasm of colon: Secondary | ICD-10-CM

## 2016-10-26 DIAGNOSIS — E538 Deficiency of other specified B group vitamins: Secondary | ICD-10-CM

## 2016-10-26 DIAGNOSIS — Z91038 Other insect allergy status: Secondary | ICD-10-CM

## 2016-10-26 DIAGNOSIS — Z91030 Bee allergy status: Secondary | ICD-10-CM

## 2016-10-26 DIAGNOSIS — D649 Anemia, unspecified: Secondary | ICD-10-CM

## 2016-10-26 LAB — CBC
HCT: 36.9 % (ref 36.0–46.0)
Hemoglobin: 11.5 g/dL — ABNORMAL LOW (ref 12.0–15.0)
MCHC: 31.1 g/dL (ref 30.0–36.0)
MCV: 81.5 fl (ref 78.0–100.0)
Platelets: 257 10*3/uL (ref 150.0–400.0)
RBC: 4.53 Mil/uL (ref 3.87–5.11)
RDW: 17.1 % — ABNORMAL HIGH (ref 11.5–15.5)
WBC: 5.2 10*3/uL (ref 4.0–10.5)

## 2016-10-26 LAB — VITAMIN B12: Vitamin B-12: 163 pg/mL — ABNORMAL LOW (ref 211–911)

## 2016-10-26 LAB — TSH: TSH: 5.48 u[IU]/mL — ABNORMAL HIGH (ref 0.35–4.50)

## 2016-10-26 LAB — HEMOGLOBIN A1C: Hgb A1c MFr Bld: 5.8 % (ref 4.6–6.5)

## 2016-10-26 MED ORDER — VALACYCLOVIR HCL 1 G PO TABS: ORAL_TABLET | ORAL | 1 refills | Status: DC

## 2016-10-26 MED ORDER — EPINEPHRINE 0.3 MG/0.3ML IJ SOAJ: 0.3000 mg | Freq: Once | INTRAMUSCULAR | 1 refills | Status: AC

## 2016-10-26 NOTE — Telephone Encounter (Signed)
Rx done. 

## 2016-10-26 NOTE — Progress Notes (Signed)
HPI:  Acute visit for B12 deficiency: -Reports history of B12 deficiency and was told to take B12 injections after her gastric bypass surgery in 2004 -She stopped the injections after a while and has noticed some tingling sensations occasionally in her hands and throughout her body -She wants to restart B12 -Occasionally feels tired -History of thyroid problems, on thyroid medicine in the past, then reports her prior PCP told her she didn't need the medicine anymore -No fevers, malaise, persistent paresthesias, weakness, numbness, bowel or skin changes -also wants refill on epipen, has bees and has had several local reaction, PCP gave her epi and these are expired -takes rare benzo for sleep - does not need refill, daughter with addiction issues so she is glad I do not rx controlled meds liberally  ROS: See pertinent positives and negatives per HPI.  Past Medical History:  Diagnosis Date  . Anxiety state, unspecified 09/20/2007  . Chronic kidney disease    kidney stones  . HYPOTHYROIDISM 03/08/2007  . Pancreatitis    secondary to ERCP    Past Surgical History:  Procedure Laterality Date  . abdominal plasty    . Carpel tunnel surgery left hand    . Childbirth x 2    . CHOLECYSTECTOMY    . GASTRIC BYPASS    . WISDOM TOOTH EXTRACTION      Family History  Problem Relation Age of Onset  . Lung cancer Father   . Heart attack Father        Died at 15    Social History   Social History  . Marital status: Married    Spouse name: N/A  . Number of children: N/A  . Years of education: N/A   Social History Main Topics  . Smoking status: Former Research scientist (life sciences)  . Smokeless tobacco: Never Used  . Alcohol use No  . Drug use: No  . Sexual activity: Not Asked   Other Topics Concern  . None   Social History Narrative  . None     Current Outpatient Prescriptions:  .  clonazePAM (KLONOPIN) 0.5 MG tablet, Take 0.5 mg by mouth 2 (two) times daily as needed for anxiety., Disp: , Rfl:   .  EPINEPHrine (EPIPEN 2-PAK) 0.3 mg/0.3 mL IJ SOAJ injection, Inject 0.3 mLs (0.3 mg total) into the muscle once., Disp: 1 Device, Rfl: 1 .  valACYclovir (VALTREX) 1000 MG tablet, ONE TABLET TWICE A DAY X 5 DAYS AS NEEDED FOR OUTBREAK, Disp: 50 tablet, Rfl: 1  EXAM:  Vitals:   10/26/16 0923  BP: 108/70  Pulse: 61  Temp: 98.5 F (36.9 C)    Body mass index is 34.84 kg/m.  GENERAL: vitals reviewed and listed above, alert, oriented, appears well hydrated and in no acute distress  HEENT: atraumatic, conjunttiva clear, no obvious abnormalities on inspection of external nose and ears  NECK: no obvious masses on inspection  LUNGS: clear to auscultation bilaterally, no wheezes, rales or rhonchi, good air movement  CV: HRRR, no peripheral edema  MS: moves all extremities without noticeable abnormality  PSYCH/NEURO: pleasant and cooperative, no obvious depression or anxiety, gait normal, speech and htought processing normal  ASSESSMENT AND PLAN:  Discussed the following assessment and plan:  B12 deficiency - Plan: Vitamin B12  Hypothyroidism, unspecified type - Plan: TSH  Paresthesias - Plan: Vitamin B12, Hemoglobin A1c, CBC  Bee sting allergy  -labs per orders -discussed options for tx b12 def and she wants to try sublingual b12 -wants to transfer to me  as PCP not available often - plans to schedule transfer visit -epipen refilled -other options for sleep discussed -Patient advised to return or notify a doctor immediately if symptoms worsen or persist or new concerns arise.  Patient Instructions  Before you leave: -Schedule new patient/transfer visit (30 minutes) with Dr. Maudie Mercury in 3 months -Labs  Start oral B12 supplement, 1000 g daily  WE NOW OFFER   [facility] FAST TRACK!!!  SAME DAY Appointments for ACUTE CARE  Such as: Sprains, Injuries, cuts, abrasions, rashes, muscle pain, joint pain, back pain Colds, flu, sore throats, headache, allergies,  cough, fever  Ear pain, sinus and eye infections Abdominal pain, nausea, vomiting, diarrhea, upset stomach Animal/insect bites  3 Easy Ways to Schedule: Walk-In Scheduling Call in scheduling Mychart Sign-up: [email or web address]           Colin Benton R., DO

## 2016-10-26 NOTE — Patient Instructions (Addendum)
Before you leave: -Schedule new patient/transfer visit (30 minutes) with Dr. Maudie Mercury in 3 months -Labs  Start oral B12 supplement, 1000 g daily  WE NOW OFFER   [facility] FAST TRACK!!!  SAME DAY Appointments for ACUTE CARE  Such as: Sprains, Injuries, cuts, abrasions, rashes, muscle pain, joint pain, back pain Colds, flu, sore throats, headache, allergies, cough, fever  Ear pain, sinus and eye infections Abdominal pain, nausea, vomiting, diarrhea, upset stomach Animal/insect bites  3 Easy Ways to Schedule: Walk-In Scheduling Call in scheduling Mychart Sign-up: [email or web address]

## 2016-10-30 NOTE — Addendum Note (Signed)
Addended by: Agnes Lawrence on: 10/30/2016 09:18 AM   Modules accepted: Orders

## 2016-11-01 ENCOUNTER — Other Ambulatory Visit (INDEPENDENT_AMBULATORY_CARE_PROVIDER_SITE_OTHER): Payer: BLUE CROSS/BLUE SHIELD

## 2016-11-01 DIAGNOSIS — D649 Anemia, unspecified: Secondary | ICD-10-CM

## 2016-11-01 DIAGNOSIS — Z1211 Encounter for screening for malignant neoplasm of colon: Secondary | ICD-10-CM | POA: Diagnosis not present

## 2016-11-01 LAB — POC HEMOCCULT BLD/STL (HOME/3-CARD/SCREEN)
Card #2 Fecal Occult Blod, POC: NEGATIVE
Card #3 Fecal Occult Blood, POC: NEGATIVE
Fecal Occult Blood, POC: NEGATIVE

## 2016-11-24 ENCOUNTER — Other Ambulatory Visit: Payer: Self-pay | Admitting: Family Medicine

## 2016-11-24 DIAGNOSIS — R7989 Other specified abnormal findings of blood chemistry: Secondary | ICD-10-CM

## 2016-11-30 ENCOUNTER — Other Ambulatory Visit (INDEPENDENT_AMBULATORY_CARE_PROVIDER_SITE_OTHER): Payer: Medicare Other

## 2016-11-30 DIAGNOSIS — R7989 Other specified abnormal findings of blood chemistry: Secondary | ICD-10-CM

## 2016-11-30 DIAGNOSIS — R946 Abnormal results of thyroid function studies: Secondary | ICD-10-CM | POA: Diagnosis not present

## 2016-11-30 LAB — T4, FREE: Free T4: 0.86 ng/dL (ref 0.60–1.60)

## 2016-11-30 LAB — TSH: TSH: 6 u[IU]/mL — ABNORMAL HIGH (ref 0.35–4.50)

## 2016-12-12 DIAGNOSIS — L570 Actinic keratosis: Secondary | ICD-10-CM | POA: Diagnosis not present

## 2016-12-12 DIAGNOSIS — D2239 Melanocytic nevi of other parts of face: Secondary | ICD-10-CM | POA: Diagnosis not present

## 2017-01-02 ENCOUNTER — Ambulatory Visit (INDEPENDENT_AMBULATORY_CARE_PROVIDER_SITE_OTHER): Payer: Medicare Other

## 2017-01-02 DIAGNOSIS — Z23 Encounter for immunization: Secondary | ICD-10-CM | POA: Diagnosis not present

## 2017-01-29 ENCOUNTER — Encounter: Payer: Self-pay | Admitting: Family Medicine

## 2017-01-29 ENCOUNTER — Ambulatory Visit (INDEPENDENT_AMBULATORY_CARE_PROVIDER_SITE_OTHER): Payer: Medicare Other | Admitting: Family Medicine

## 2017-01-29 ENCOUNTER — Ambulatory Visit: Payer: BLUE CROSS/BLUE SHIELD | Admitting: Family Medicine

## 2017-01-29 VITALS — BP 122/78 | HR 72 | Temp 97.8°F | Wt 187.0 lb

## 2017-01-29 DIAGNOSIS — R419 Unspecified symptoms and signs involving cognitive functions and awareness: Secondary | ICD-10-CM

## 2017-01-29 DIAGNOSIS — Z8619 Personal history of other infectious and parasitic diseases: Secondary | ICD-10-CM | POA: Diagnosis not present

## 2017-01-29 DIAGNOSIS — Z23 Encounter for immunization: Secondary | ICD-10-CM

## 2017-01-29 DIAGNOSIS — Z0000 Encounter for general adult medical examination without abnormal findings: Secondary | ICD-10-CM

## 2017-01-29 DIAGNOSIS — H9190 Unspecified hearing loss, unspecified ear: Secondary | ICD-10-CM

## 2017-01-29 DIAGNOSIS — D510 Vitamin B12 deficiency anemia due to intrinsic factor deficiency: Secondary | ICD-10-CM | POA: Diagnosis not present

## 2017-01-29 LAB — CBC WITH DIFFERENTIAL/PLATELET
Basophils Absolute: 0 10*3/uL (ref 0.0–0.1)
Basophils Relative: 0.7 % (ref 0.0–3.0)
Eosinophils Absolute: 0.1 10*3/uL (ref 0.0–0.7)
Eosinophils Relative: 1.4 % (ref 0.0–5.0)
HCT: 35.7 % — ABNORMAL LOW (ref 36.0–46.0)
Hemoglobin: 11.2 g/dL — ABNORMAL LOW (ref 12.0–15.0)
Lymphocytes Relative: 34.9 % (ref 12.0–46.0)
Lymphs Abs: 1.7 10*3/uL (ref 0.7–4.0)
MCHC: 31.3 g/dL (ref 30.0–36.0)
MCV: 81 fl (ref 78.0–100.0)
Monocytes Absolute: 0.5 10*3/uL (ref 0.1–1.0)
Monocytes Relative: 9.2 % (ref 3.0–12.0)
Neutro Abs: 2.6 10*3/uL (ref 1.4–7.7)
Neutrophils Relative %: 53.8 % (ref 43.0–77.0)
Platelets: 270 10*3/uL (ref 150.0–400.0)
RBC: 4.41 Mil/uL (ref 3.87–5.11)
RDW: 16.7 % — ABNORMAL HIGH (ref 11.5–15.5)
WBC: 4.9 10*3/uL (ref 4.0–10.5)

## 2017-01-29 LAB — LIPID PANEL
Cholesterol: 137 mg/dL (ref 0–200)
HDL: 62.7 mg/dL (ref >39.00)
LDL Cholesterol: 65 mg/dL (ref 0–99)
NonHDL: 74.6
Total CHOL/HDL Ratio: 2
Triglycerides: 50 mg/dL (ref 0.0–149.0)
VLDL: 10 mg/dL (ref 0.0–40.0)

## 2017-01-29 LAB — BASIC METABOLIC PANEL
BUN: 19 mg/dL (ref 6–23)
CO2: 28 mEq/L (ref 19–32)
Calcium: 9.2 mg/dL (ref 8.4–10.5)
Chloride: 107 mEq/L (ref 96–112)
Creatinine, Ser: 1.04 mg/dL (ref 0.40–1.20)
GFR: 56.49 mL/min — ABNORMAL LOW (ref >60.00)
Glucose, Bld: 97 mg/dL (ref 70–99)
Potassium: 4.2 mEq/L (ref 3.5–5.1)
Sodium: 140 mEq/L (ref 135–145)

## 2017-01-29 LAB — HEPATIC FUNCTION PANEL
ALT: 17 U/L (ref 0–35)
AST: 23 U/L (ref 0–37)
Albumin: 4 g/dL (ref 3.5–5.2)
Alkaline Phosphatase: 56 U/L (ref 39–117)
Bilirubin, Direct: 0.1 mg/dL (ref 0.0–0.3)
Total Bilirubin: 0.4 mg/dL (ref 0.2–1.2)
Total Protein: 6.4 g/dL (ref 6.0–8.3)

## 2017-01-29 LAB — VITAMIN B12: Vitamin B-12: 1494 pg/mL — ABNORMAL HIGH (ref 211–911)

## 2017-01-29 MED ORDER — CLONAZEPAM 0.5 MG PO TABS: 0.5000 mg | ORAL_TABLET | Freq: Two times a day (BID) | ORAL | 1 refills | Status: DC | PRN

## 2017-01-29 MED ORDER — EPINEPHRINE 0.3 MG/0.3ML IJ SOAJ: 0.3000 mg | Freq: Once | INTRAMUSCULAR | 1 refills | Status: AC

## 2017-01-29 NOTE — Patient Instructions (Addendum)
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 If you do have allergic reaction use the EpiPen immediately and call 911  Walk 30 minutes daily,,,,, carbohydrate free diet,,,,, goal to lose 12 pounds in the next 12 months  Return in one year sooner if any problems  Call your insurance company to find out where he get the shingles vaccine.,  I would recommend Dr. Tonia Ghent at [facility]

## 2017-01-29 NOTE — Addendum Note (Signed)
Addended by: Wyvonne Lenz on: 01/29/2017 10:29 AM   Modules accepted: Orders

## 2017-01-29 NOTE — Addendum Note (Signed)
Addended by: Javelle Donigan A on: 01/29/2017 10:17 AM   Modules accepted: Orders

## 2017-01-29 NOTE — Progress Notes (Addendum)
Jaime Fuller is a 66 year old married female nonsmoker who comes in today for general physical examination  She's always been next and how she has no chronic health problems. She takes Klonopin 0.5 daily at bedtime when necessary for anxiety. She typically takes for 5 pills per year only.  She takes acyclovir when she has an outbreak of HSV-1.  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 She's never had a history of allergic reaction but would like an EpiPen just in case.  She was seen here in August and had a low B12 level CLXIV. She was taking oral B12 since that time. We'll recheck her B12 level today.  BP 122/78 (BP Location: Left Arm, Patient Position: Sitting, Cuff Size: Normal)   Pulse 72   Temp 97.8 F (36.6 C) (Oral)   Wt 187 lb (84.8 kg)   BMI 35.33 kg/m  She is a well-developed well-nourished slightly overweight female no acute distress. Examination HEENT were negative neck was supple thyroid is not enlarged no carotid bruits cardiopulmonary exam normal breast exam normal abdominal exam normal except for scars from her previous abdominoplasty. She developed a very loosed skin in her abdomen when she lost weight. Pelvic and rectal by GYN therefore deferred extremities normal skin normal peripheral pulses normal  #1 healthy female  #2 history of recurrent HSV-1....... acyclovir when necessary  #3 occasional anxiety..... 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 instructed to do EpiPen if she does have allergic reaction: 911 and  come to the hospital immediately.  #5 history of B12 deficiency,,,,,,,, on oral B12 for 3 months,,,,,,,, recheck labs to be sure B12 levels back to normal  #6 hearing loss,,,,,,,,,,,,, ENT consult with Dr. Tonia Ghent at [facility]

## 2017-02-05 ENCOUNTER — Telehealth: Payer: Self-pay | Admitting: Family Medicine

## 2017-02-05 NOTE — Telephone Encounter (Signed)
Copied from [place]. Topic: General - Other >> Feb 02, 2017  8:07 AM Carolyn Stare wrote: Reason for CRM:  pt said she only given a 15 day supply of the following med clonazePAM (KLONOPIN) 0.5 MG tablet she said she is taking twice a day and is asking if this is correct . She said she thought maybe she would get a 30 day supply which would have been 60 pills Would like a call back    [phone]

## 2017-02-05 NOTE — Telephone Encounter (Signed)
Called pt left a message to return my call in regards to her Medication refill.

## 2017-02-06 NOTE — Telephone Encounter (Signed)
Spoke with pt in regards to her medication refill for Clonazepam, verbalized understanding that the rx was sent to her pharmacy with 1 refill. Pt was also given her Lab results for B12 levels

## 2017-02-21 ENCOUNTER — Telehealth: Payer: Self-pay | Admitting: Family Medicine

## 2017-02-21 NOTE — Telephone Encounter (Signed)
I spoke with pt and pharmacy, they will dispense Mylan brand and that is a $40 co pay, advised pt to contact pharmacy if she wants that brand.

## 2017-02-21 NOTE — Telephone Encounter (Signed)
Copied from [place] [id number]. Topic: Quick Communication - See Telephone Encounter >> Feb 21, 2017 10:11 AM Aurelio Brash B wrote: CRM for notification. See Telephone encounter for:  Pt states insurance will no longer cover epi pen  and would like to talk to Dr or nurse about this 02/21/17.

## 2017-03-08 DIAGNOSIS — M25562 Pain in left knee: Secondary | ICD-10-CM | POA: Diagnosis not present

## 2017-03-08 DIAGNOSIS — S82035A Nondisplaced transverse fracture of left patella, initial encounter for closed fracture: Secondary | ICD-10-CM | POA: Diagnosis not present

## 2017-03-30 DIAGNOSIS — S82035D Nondisplaced transverse fracture of left patella, subsequent encounter for closed fracture with routine healing: Secondary | ICD-10-CM | POA: Diagnosis not present

## 2017-03-30 DIAGNOSIS — M25562 Pain in left knee: Secondary | ICD-10-CM | POA: Diagnosis not present

## 2017-04-25 DIAGNOSIS — M25552 Pain in left hip: Secondary | ICD-10-CM | POA: Diagnosis not present

## 2017-04-25 DIAGNOSIS — M25562 Pain in left knee: Secondary | ICD-10-CM | POA: Diagnosis not present

## 2017-05-09 ENCOUNTER — Telehealth: Payer: Self-pay | Admitting: Family Medicine

## 2017-05-09 NOTE — Telephone Encounter (Signed)
Copied from [place] [id number]. Topic: Quick Communication - See Telephone Encounter >> May 09, 2017 11:46 AM Boyd Kerbs wrote: CRM for notification. See Telephone encounter for:   Pt. Called says she has UTI and wants to know if can come give urine sample?  Please call pt.   05/09/17.

## 2017-05-10 NOTE — Telephone Encounter (Signed)
Spoke with pt stated that she did not want to see a doctor as yet since she started taking Antibiotics prescribed previously for UTI, states that the symptoms are getting better, Pt was Advised to call the office and schedule an appointment if symptoms persist or don't get better. Pt voiced  understanding

## 2017-06-16 IMAGING — DX DG ABDOMEN 1V
2 series · 2 of 2 positions shown · non-contrast
Comparison: 07/13/2015

CLINICAL DATA: Renal stone, history of pancreatitis

EXAM:
ABDOMEN - 1 VIEW

[abdomen kub (1 of 2)]
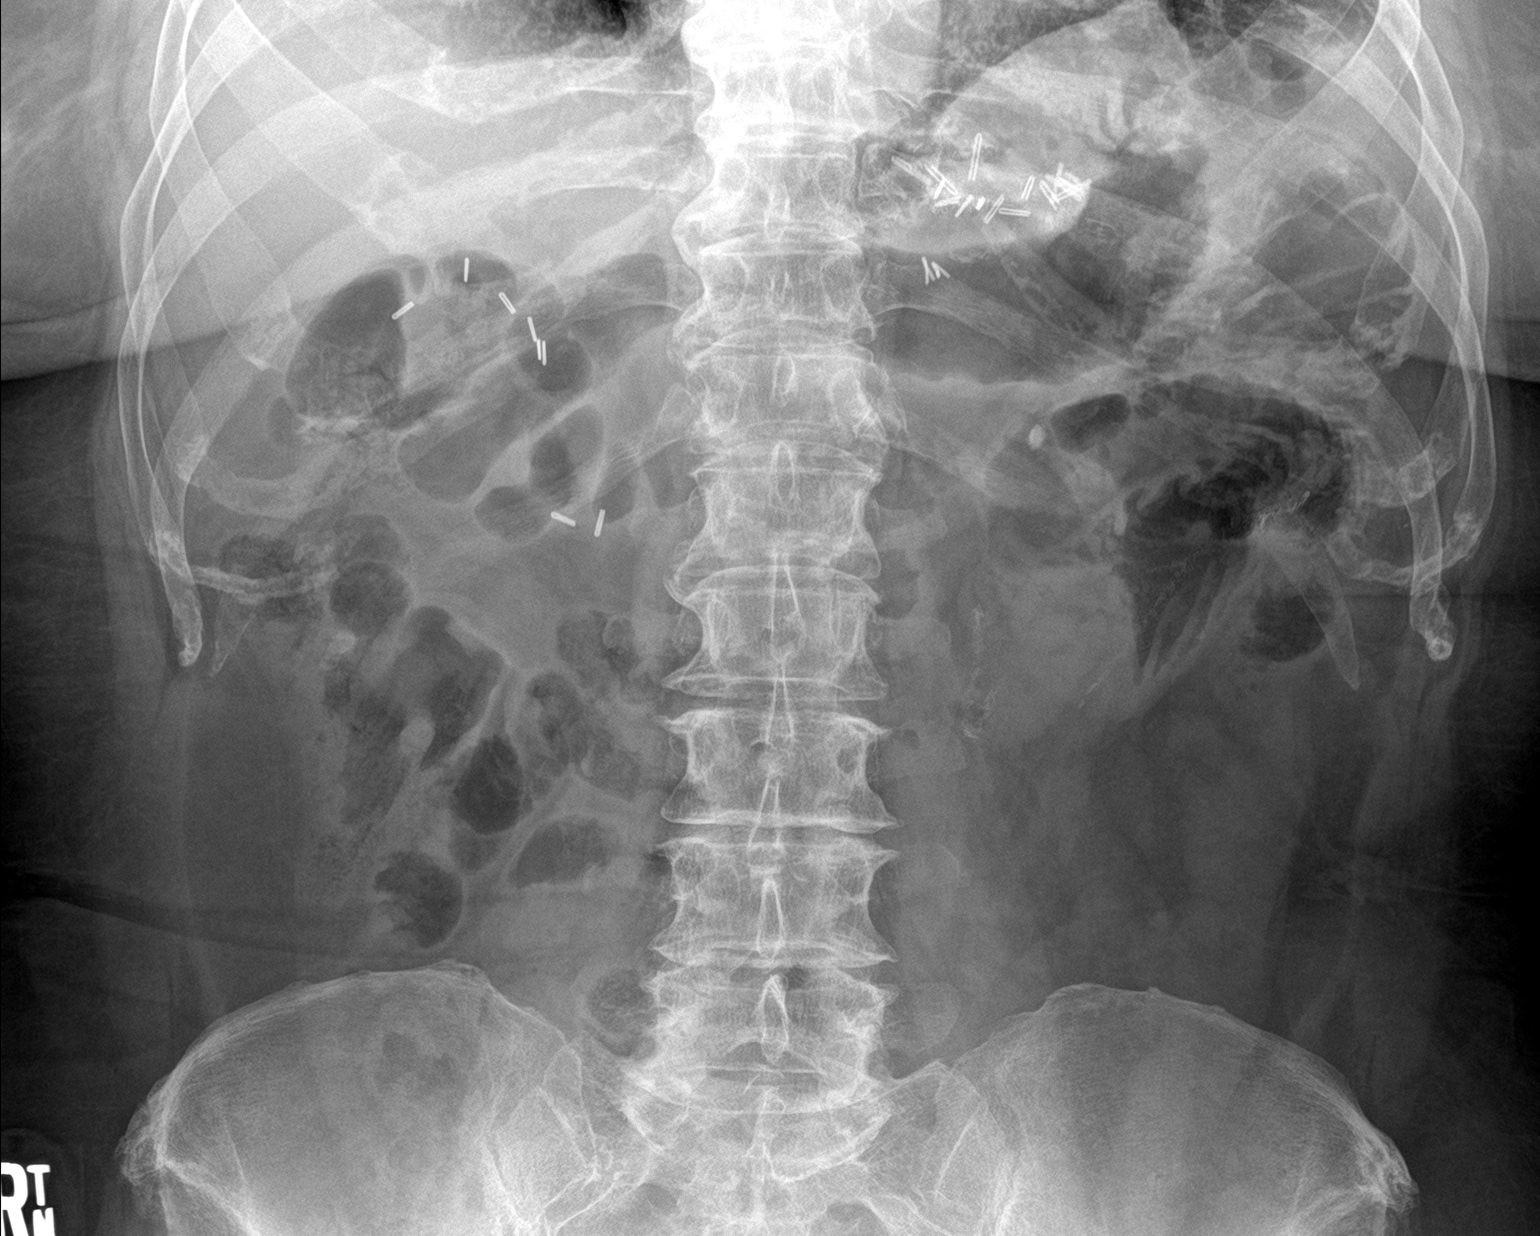

[abdomen kub (2 of 2)]
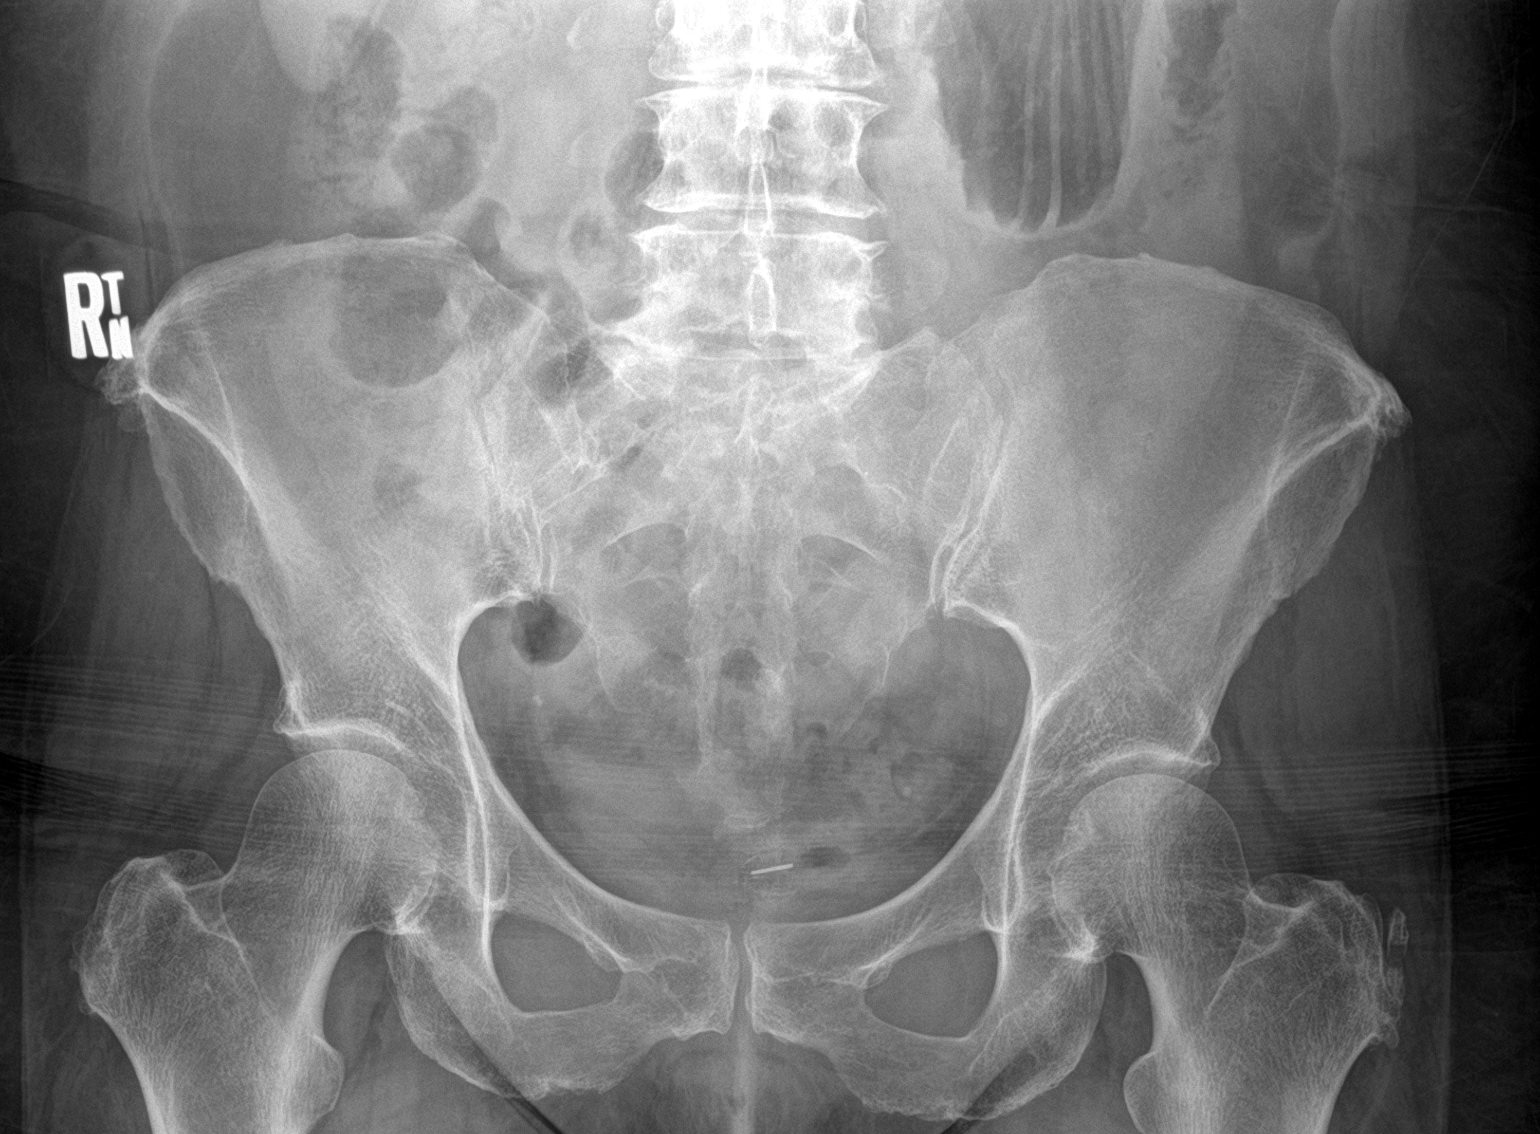

[2 of 2 positions shown; findings below may reference images not displayed]

FINDINGS: 7 mm calcific density over left renal shadow stable. A few mildly
dilated loops of left upper quadrant small bowel are present. Gas is
seen into the colon.
IMPRESSION: Possible localized ileus left upper quadrant.

## 2017-06-20 ENCOUNTER — Ambulatory Visit (INDEPENDENT_AMBULATORY_CARE_PROVIDER_SITE_OTHER): Payer: Medicare Other | Admitting: Internal Medicine

## 2017-06-20 ENCOUNTER — Encounter: Payer: Self-pay | Admitting: Internal Medicine

## 2017-06-20 VITALS — BP 110/64 | HR 73 | Temp 98.5°F | Wt 193.3 lb

## 2017-06-20 DIAGNOSIS — R109 Unspecified abdominal pain: Secondary | ICD-10-CM | POA: Diagnosis not present

## 2017-06-20 DIAGNOSIS — R8290 Unspecified abnormal findings in urine: Secondary | ICD-10-CM | POA: Diagnosis not present

## 2017-06-20 DIAGNOSIS — R300 Dysuria: Secondary | ICD-10-CM | POA: Diagnosis not present

## 2017-06-20 LAB — POCT URINALYSIS DIPSTICK
Glucose, UA: NEGATIVE
Ketones, UA: NEGATIVE
Leukocytes, UA: NEGATIVE
Odor: NEGATIVE
Protein, UA: NEGATIVE
Spec Grav, UA: >=1.03 — AB (ref 1.010–1.025)
Urobilinogen, UA: 0.2 E.U./dL
pH, UA: 6 (ref 5.0–8.0)

## 2017-06-20 MED ORDER — SULFAMETHOXAZOLE-TRIMETHOPRIM 800-160 MG PO TABS: 1.0000 | ORAL_TABLET | Freq: Two times a day (BID) | ORAL | 0 refills | Status: DC

## 2017-06-20 NOTE — Progress Notes (Signed)
Chief Complaint  Patient presents with  . Urinary Tract Infection    Pt c/o side and flank pain - worse on right than left side. Pt ran a low grade fever over the weekend 99.5. Pt started burning with urination today and took an AZO test and it was positive. Urine dip collected.    HPI: Jaime Fuller 67 y.o.   sda PCP NA   Hx of uti and just got over one feb march  And self treated  Then   Had somE  Doxycyline  At home   12  And went away.   Then  About 5 days ago  Cold and low grade temp and   Then felt burNing when standing.  with urinating and back pain  And flank pain .  Some frequency  And urgency .    Stress incontinence the weekend.  Did azo test at home.  One  Pink .   AND ONE NEG .feels flushed at times  .   ROS: See pertinent positives and negatives per HPI. No cp sob  Rash   Past Medical History:  Diagnosis Date  . Anxiety state, unspecified 09/20/2007  . Chronic kidney disease    kidney stones  . HYPOTHYROIDISM 03/08/2007  . Pancreatitis    secondary to ERCP    Family History  Problem Relation Age of Onset  . Lung cancer Father   . Heart attack Father        Died at 76    Social History   Socioeconomic History  . Marital status: Married    Spouse name: Not on file  . Number of children: Not on file  . Years of education: Not on file  . Highest education level: Not on file  Occupational History  . Not on file  Social Needs  . Financial resource strain: Not on file  . Food insecurity:    Worry: Not on file    Inability: Not on file  . Transportation needs:    Medical: Not on file    Non-medical: Not on file  Tobacco Use  . Smoking status: Former Research scientist (life sciences)  . Smokeless tobacco: Never Used  Substance and Sexual Activity  . Alcohol use: No    Alcohol/week: 0.0 oz  . Drug use: No  . Sexual activity: Not on file  Lifestyle  . Physical activity:    Days per week: Not on file    Minutes per session: Not on file  . Stress: Not on file  Relationships   . Social connections:    Talks on phone: Not on file    Gets together: Not on file    Attends religious service: Not on file    Active member of club or organization: Not on file    Attends meetings of clubs or organizations: Not on file    Relationship status: Not on file  Other Topics Concern  . Not on file  Social History Narrative  . Not on file    Outpatient Medications Prior to Visit  Medication Sig Dispense Refill  . clonazePAM (KLONOPIN) 0.5 MG tablet Take 1 tablet (0.5 mg total) 2 (two) times daily as needed by mouth for anxiety. 30 tablet 1  . Cyanocobalamin (VITAMIN B12) 1000 MCG TBCR Take 1,000 mcg at bedtime by mouth.    . valACYclovir (VALTREX) 1000 MG tablet ONE TABLET TWICE A DAY X 5 DAYS AS NEEDED FOR OUTBREAK 50 tablet 1   No facility-administered medications prior to visit.  EXAM:  BP 110/64 (BP Location: Right Arm)   Pulse 73   Temp 98.5 F (36.9 C) (Oral)   Wt 193 lb 4.8 oz (87.7 kg)   BMI 36.52 kg/m   Body mass index is 36.52 kg/m.  GENERAL: vitals reviewed and listed above, alert, oriented, appears well hydrated and in no acute distress HEENT: atraumatic, conjunctiva  clear, no obvious abnormalities on inspection of external nose and ears NECK: no obvious masses on inspection palpation  LUNGS: clear to auscultation bilaterally, no wheezes, rales or rhonchi, good air movement CV: HRRR, no clubbing cyanosis or  peripheral edema nl cap refill  Abdomen:  Sof,t normal bowel sounds without hepatosplenomegaly, no guarding rebound or masses no CVA tenderness  But some pain right lower back on exam note  No psoas sign.  MS: moves all extremities without noticeable focal  abnormality PSYCH: pleasant and cooperative, no obvious depression or anxiety  ASSESSMENT AND PLAN:  Discussed the following assessment and plan:  Dysuria - Plan: POC Urinalysis Dipstick, Culture, Urine  Rt flank pain  Abnormal urinalysis - pos nitirites prob uti  empric rx and  culture pending  ua pos nitrites   Bu neg leuk  Will ur cx and treat  empirically   Hx of e coli a year ago   -Patient advised to return or notify health care team  if symptoms worsen ,persist or new concerns arise.  Patient Instructions  Treating you for a uti  Will let you know when culture    Back  Sometimes we have to change medication   If bacteria is resistant to the antibiotic given      Urinary Tract Infection, Adult A urinary tract infection (UTI) is an infection of any part of the urinary tract, which includes the kidneys, ureters, bladder, and urethra. These organs make, store, and get rid of urine in the body. UTI can be a bladder infection (cystitis) or kidney infection (pyelonephritis). What are the causes? This infection may be caused by fungi, viruses, or bacteria. Bacteria are the most common cause of UTIs. This condition can also be caused by repeated incomplete emptying of the bladder during urination. What increases the risk? This condition is more likely to develop if:  You ignore your need to urinate or hold urine for long periods of time.  You do not empty your bladder completely during urination.  You wipe back to front after urinating or having a bowel movement, if you are female.  You are uncircumcised, if you are female.  You are constipated.  You have a urinary catheter that stays in place (indwelling).  You have a weak defense (immune) system.  You have a medical condition that affects your bowels, kidneys, or bladder.  You have diabetes.  You take antibiotic medicines frequently or for long periods of time, and the antibiotics no longer work well against certain types of infections (antibiotic resistance).  You take medicines that irritate your urinary tract.  You are exposed to chemicals that irritate your urinary tract.  You are female.  What are the signs or symptoms? Symptoms of this condition include:  Fever.  Frequent urination or  passing small amounts of urine frequently.  Needing to urinate urgently.  Pain or burning with urination.  Urine that smells bad or unusual.  Cloudy urine.  Pain in the lower abdomen or back.  Trouble urinating.  Blood in the urine.  Vomiting or being less hungry than normal.  Diarrhea or abdominal pain.  Vaginal  discharge, if you are female.  How is this diagnosed? This condition is diagnosed with a medical history and physical exam. You will also need to provide a urine sample to test your urine. Other tests may be done, including:  Blood tests.  Sexually transmitted disease (STD) testing.  If you have had more than one UTI, a cystoscopy or imaging studies may be done to determine the cause of the infections. How is this treated? Treatment for this condition often includes a combination of two or more of the following:  Antibiotic medicine.  Other medicines to treat less common causes of UTI.  Over-the-counter medicines to treat pain.  Drinking enough water to stay hydrated.  Follow these instructions at home:  Take over-the-counter and prescription medicines only as told by your health care provider.  If you were prescribed an antibiotic, take it as told by your health care provider. Do not stop taking the antibiotic even if you start to feel better.  Avoid alcohol, caffeine, tea, and carbonated beverages. They can irritate your bladder.  Drink enough fluid to keep your urine clear or pale yellow.  Keep all follow-up visits as told by your health care provider. This is important.  Make sure to: ? Empty your bladder often and completely. Do not hold urine for long periods of time. ? Empty your bladder before and after sex. ? Wipe from front to back after a bowel movement if you are female. Use each tissue one time when you wipe. Contact a health care provider if:  You have back pain.  You have a fever.  You feel nauseous or vomit.  Your symptoms do  not get better after 3 days.  Your symptoms go away and then return. Get help right away if:  You have severe back pain or lower abdominal pain.  You are vomiting and cannot keep down any medicines or water. This information is not intended to replace advice given to you by your health care provider. Make sure you discuss any questions you have with your health care provider. Document Released: 12/07/2004 Document Revised: 08/11/2015 Document Reviewed: 01/18/2015 Elsevier Interactive Patient Education  2018 [place]. Levy Cedano M.D.

## 2017-06-20 NOTE — Patient Instructions (Addendum)
Treating you for a uti  Will let you know when culture    Back  Sometimes we have to change medication   If bacteria is resistant to the antibiotic given      Urinary Tract Infection, Adult A urinary tract infection (UTI) is an infection of any part of the urinary tract, which includes the kidneys, ureters, bladder, and urethra. These organs make, store, and get rid of urine in the body. UTI can be a bladder infection (cystitis) or kidney infection (pyelonephritis). What are the causes? This infection may be caused by fungi, viruses, or bacteria. Bacteria are the most common cause of UTIs. This condition can also be caused by repeated incomplete emptying of the bladder during urination. What increases the risk? This condition is more likely to develop if:  You ignore your need to urinate or hold urine for long periods of time.  You do not empty your bladder completely during urination.  You wipe back to front after urinating or having a bowel movement, if you are female.  You are uncircumcised, if you are female.  You are constipated.  You have a urinary catheter that stays in place (indwelling).  You have a weak defense (immune) system.  You have a medical condition that affects your bowels, kidneys, or bladder.  You have diabetes.  You take antibiotic medicines frequently or for long periods of time, and the antibiotics no longer work well against certain types of infections (antibiotic resistance).  You take medicines that irritate your urinary tract.  You are exposed to chemicals that irritate your urinary tract.  You are female.  What are the signs or symptoms? Symptoms of this condition include:  Fever.  Frequent urination or passing small amounts of urine frequently.  Needing to urinate urgently.  Pain or burning with urination.  Urine that smells bad or unusual.  Cloudy urine.  Pain in the lower abdomen or back.  Trouble urinating.  Blood in the  urine.  Vomiting or being less hungry than normal.  Diarrhea or abdominal pain.  Vaginal discharge, if you are female.  How is this diagnosed? This condition is diagnosed with a medical history and physical exam. You will also need to provide a urine sample to test your urine. Other tests may be done, including:  Blood tests.  Sexually transmitted disease (STD) testing.  If you have had more than one UTI, a cystoscopy or imaging studies may be done to determine the cause of the infections. How is this treated? Treatment for this condition often includes a combination of two or more of the following:  Antibiotic medicine.  Other medicines to treat less common causes of UTI.  Over-the-counter medicines to treat pain.  Drinking enough water to stay hydrated.  Follow these instructions at home:  Take over-the-counter and prescription medicines only as told by your health care provider.  If you were prescribed an antibiotic, take it as told by your health care provider. Do not stop taking the antibiotic even if you start to feel better.  Avoid alcohol, caffeine, tea, and carbonated beverages. They can irritate your bladder.  Drink enough fluid to keep your urine clear or pale yellow.  Keep all follow-up visits as told by your health care provider. This is important.  Make sure to: ? Empty your bladder often and completely. Do not hold urine for long periods of time. ? Empty your bladder before and after sex. ? Wipe from front to back after a bowel movement if  you are female. Use each tissue one time when you wipe. Contact a health care provider if:  You have back pain.  You have a fever.  You feel nauseous or vomit.  Your symptoms do not get better after 3 days.  Your symptoms go away and then return. Get help right away if:  You have severe back pain or lower abdominal pain.  You are vomiting and cannot keep down any medicines or water. This information is not  intended to replace advice given to you by your health care provider. Make sure you discuss any questions you have with your health care provider. Document Released: 12/07/2004 Document Revised: 08/11/2015 Document Reviewed: 01/18/2015 Elsevier Interactive Patient Education  [employer].

## 2017-06-22 ENCOUNTER — Other Ambulatory Visit: Payer: Self-pay

## 2017-06-22 LAB — URINE CULTURE
MICRO NUMBER:: 90442523
SPECIMEN QUALITY:: ADEQUATE

## 2017-06-22 MED ORDER — CEPHALEXIN 500 MG PO CAPS: 500.0000 mg | ORAL_CAPSULE | Freq: Three times a day (TID) | ORAL | 0 refills | Status: DC

## 2017-06-22 NOTE — Progress Notes (Signed)
Tell patient that urine culture shows e coli  Resistant to the medication ordered   Change antibiotic to  Keflex 500 mg  1 po tid for 5 days  Disp 15 and fu if not better at end of medication

## 2017-08-16 DIAGNOSIS — M549 Dorsalgia, unspecified: Secondary | ICD-10-CM | POA: Diagnosis not present

## 2017-08-16 DIAGNOSIS — M47816 Spondylosis without myelopathy or radiculopathy, lumbar region: Secondary | ICD-10-CM | POA: Diagnosis not present

## 2017-08-28 DIAGNOSIS — M48061 Spinal stenosis, lumbar region without neurogenic claudication: Secondary | ICD-10-CM | POA: Diagnosis not present

## 2017-11-23 DIAGNOSIS — M5416 Radiculopathy, lumbar region: Secondary | ICD-10-CM | POA: Diagnosis not present

## 2017-11-23 DIAGNOSIS — M5116 Intervertebral disc disorders with radiculopathy, lumbar region: Secondary | ICD-10-CM | POA: Diagnosis not present

## 2017-12-25 ENCOUNTER — Ambulatory Visit (INDEPENDENT_AMBULATORY_CARE_PROVIDER_SITE_OTHER): Payer: Medicare Other

## 2017-12-25 DIAGNOSIS — Z23 Encounter for immunization: Secondary | ICD-10-CM

## 2018-02-28 DIAGNOSIS — M47816 Spondylosis without myelopathy or radiculopathy, lumbar region: Secondary | ICD-10-CM | POA: Diagnosis not present

## 2018-02-28 DIAGNOSIS — Z6833 Body mass index (BMI) 33.0-33.9, adult: Secondary | ICD-10-CM | POA: Diagnosis not present

## 2018-03-14 DIAGNOSIS — M5127 Other intervertebral disc displacement, lumbosacral region: Secondary | ICD-10-CM | POA: Diagnosis not present

## 2018-03-14 DIAGNOSIS — M47816 Spondylosis without myelopathy or radiculopathy, lumbar region: Secondary | ICD-10-CM | POA: Diagnosis not present

## 2018-03-14 DIAGNOSIS — M5126 Other intervertebral disc displacement, lumbar region: Secondary | ICD-10-CM | POA: Diagnosis not present

## 2018-03-21 DIAGNOSIS — M47816 Spondylosis without myelopathy or radiculopathy, lumbar region: Secondary | ICD-10-CM | POA: Diagnosis not present

## 2018-04-19 DIAGNOSIS — M4726 Other spondylosis with radiculopathy, lumbar region: Secondary | ICD-10-CM | POA: Diagnosis not present

## 2018-04-22 DIAGNOSIS — M4726 Other spondylosis with radiculopathy, lumbar region: Secondary | ICD-10-CM | POA: Diagnosis not present

## 2018-04-24 DIAGNOSIS — M4726 Other spondylosis with radiculopathy, lumbar region: Secondary | ICD-10-CM | POA: Diagnosis not present

## 2018-04-29 DIAGNOSIS — M4726 Other spondylosis with radiculopathy, lumbar region: Secondary | ICD-10-CM | POA: Diagnosis not present

## 2018-05-01 DIAGNOSIS — M4726 Other spondylosis with radiculopathy, lumbar region: Secondary | ICD-10-CM | POA: Diagnosis not present

## 2018-05-06 DIAGNOSIS — M4726 Other spondylosis with radiculopathy, lumbar region: Secondary | ICD-10-CM | POA: Diagnosis not present

## 2018-05-07 DIAGNOSIS — M4726 Other spondylosis with radiculopathy, lumbar region: Secondary | ICD-10-CM | POA: Diagnosis not present

## 2018-05-08 DIAGNOSIS — M4726 Other spondylosis with radiculopathy, lumbar region: Secondary | ICD-10-CM | POA: Diagnosis not present

## 2018-05-13 DIAGNOSIS — M4726 Other spondylosis with radiculopathy, lumbar region: Secondary | ICD-10-CM | POA: Diagnosis not present

## 2018-05-16 DIAGNOSIS — M4726 Other spondylosis with radiculopathy, lumbar region: Secondary | ICD-10-CM | POA: Diagnosis not present

## 2018-10-09 ENCOUNTER — Other Ambulatory Visit: Payer: Self-pay

## 2018-12-11 ENCOUNTER — Telehealth: Payer: Self-pay | Admitting: Family Medicine

## 2018-12-11 NOTE — Telephone Encounter (Signed)
Left a vm for patient to callback reagarding shingles vaccine.

## 2018-12-11 NOTE — Telephone Encounter (Unsigned)
Copied from [place] [id number]. Topic: General - Other >> Dec 11, 2018 11:41 AM Yvette Rack wrote: Reason for CRM: Pt stated her husband just received a Rx for the shingles vaccine but she is afraid for him to take it because she has never had the chickenpox. Pt requests call back from a nurse.

## 2018-12-24 ENCOUNTER — Other Ambulatory Visit: Payer: Self-pay

## 2018-12-24 ENCOUNTER — Ambulatory Visit (INDEPENDENT_AMBULATORY_CARE_PROVIDER_SITE_OTHER): Payer: Medicare Other

## 2018-12-24 DIAGNOSIS — Z23 Encounter for immunization: Secondary | ICD-10-CM

## 2019-01-01 ENCOUNTER — Encounter: Payer: Self-pay | Admitting: Internal Medicine

## 2019-01-01 ENCOUNTER — Other Ambulatory Visit: Payer: Self-pay

## 2019-01-01 ENCOUNTER — Ambulatory Visit (INDEPENDENT_AMBULATORY_CARE_PROVIDER_SITE_OTHER): Payer: Medicare Other | Admitting: Internal Medicine

## 2019-01-01 VITALS — BP 110/70 | HR 67 | Temp 97.3°F | Ht 62.0 in | Wt 184.6 lb

## 2019-01-01 DIAGNOSIS — Z13820 Encounter for screening for osteoporosis: Secondary | ICD-10-CM

## 2019-01-01 DIAGNOSIS — Z1239 Encounter for other screening for malignant neoplasm of breast: Secondary | ICD-10-CM

## 2019-01-01 DIAGNOSIS — D510 Vitamin B12 deficiency anemia due to intrinsic factor deficiency: Secondary | ICD-10-CM | POA: Diagnosis not present

## 2019-01-01 DIAGNOSIS — Z1211 Encounter for screening for malignant neoplasm of colon: Secondary | ICD-10-CM

## 2019-01-01 NOTE — Patient Instructions (Signed)
-  Nice seeing you today!!  -Schedule your physical. Please com e in fasting that day.

## 2019-01-01 NOTE — Progress Notes (Signed)
Established Patient Office Visit     CC/Reason for Visit: Establish care, discuss chronic conditions  HPI: Jaime Fuller is a 68 y.o. female who is coming in today for the above mentioned reasons. Past Medical History is significant for: Lower back issues followed by Dr. Ellene Route on PRN tramadol and muscle relaxers. She had a Roux en Y bypass in 2004 with a subsequent tummy tuck. She has had B12 deficiency but has not had levels checked in years. She was also treated for hypothyroidism at one point. She is a beekeper. Used to be a smoker but quit at age 36. She has no acute complaints today.   Past Medical/Surgical History: Past Medical History:  Diagnosis Date  . Anxiety state, unspecified 09/20/2007  . Chronic kidney disease    kidney stones  . HYPOTHYROIDISM 03/08/2007  . Pancreatitis    secondary to ERCP    Past Surgical History:  Procedure Laterality Date  . abdominal plasty    . Carpel tunnel surgery left hand    . Childbirth x 2    . CHOLECYSTECTOMY    . GASTRIC BYPASS    . WISDOM TOOTH EXTRACTION      Social History:  reports that she has quit smoking. She has never used smokeless tobacco. She reports that she does not drink alcohol or use drugs.  Allergies: Allergies  Allergen Reactions  . Vicodin [Hydrocodone-Acetaminophen] Itching    Family History:  Family History  Problem Relation Age of Onset  . Lung cancer Father   . Heart attack Father        Died at 54     Current Outpatient Medications:  .  clonazePAM (KLONOPIN) 0.5 MG tablet, Take 1 tablet (0.5 mg total) 2 (two) times daily as needed by mouth for anxiety., Disp: 30 tablet, Rfl: 1 .  Cyanocobalamin (VITAMIN B12) 1000 MCG TBCR, Take 1,000 mcg at bedtime by mouth., Disp: , Rfl:  .  EPINEPHrine 0.3 mg/0.3 mL IJ SOAJ injection, Inject 0.3 mg into the muscle as needed for anaphylaxis., Disp: , Rfl:  .  valACYclovir (VALTREX) 1000 MG tablet, ONE TABLET TWICE A DAY X 5 DAYS AS NEEDED FOR OUTBREAK,  Disp: 50 tablet, Rfl: 1  Review of Systems:  Constitutional: Denies fever, chills, diaphoresis, appetite change and fatigue.  HEENT: Denies photophobia, eye pain, redness, hearing loss, ear pain, congestion, sore throat, rhinorrhea, sneezing, mouth sores, trouble swallowing, neck pain, neck stiffness and tinnitus.   Respiratory: Denies SOB, DOE, cough, chest tightness,  and wheezing.   Cardiovascular: Denies chest pain, palpitations and leg swelling.  Gastrointestinal: Denies nausea, vomiting, abdominal pain, diarrhea, constipation, blood in stool and abdominal distention.  Genitourinary: Denies dysuria, urgency, frequency, hematuria, flank pain and difficulty urinating.  Endocrine: Denies: hot or cold intolerance, sweats, changes in hair or nails, polyuria, polydipsia. Musculoskeletal: Denies myalgias, back pain, joint swelling, arthralgias and gait problem.  Skin: Denies pallor, rash and wound.  Neurological: Denies dizziness, seizures, syncope, weakness, light-headedness, numbness and headaches.  Hematological: Denies adenopathy. Easy bruising, personal or family bleeding history  Psychiatric/Behavioral: Denies suicidal ideation, mood changes, confusion, nervousness, sleep disturbance and agitation    Physical Exam: Vitals:   01/01/19 0712  BP: 110/70  Pulse: 67  Temp: (!) 97.3 F (36.3 C)  TempSrc: Temporal  SpO2: 98%  Weight: 184 lb 9.6 oz (83.7 kg)  Height: 5\' 2"  (1.575 m)    Body mass index is 33.76 kg/m.   Constitutional: NAD, calm, comfortable Eyes: PERRL, lids  and conjunctivae normal ENMT: Mucous membranes are moist.  Tympanic membrane is pearly white, no erythema or bulging. Neck: normal, supple, no masses, no thyromegaly Respiratory: clear to auscultation bilaterally, no wheezing, no crackles. Normal respiratory effort. No accessory muscle use.  Cardiovascular: Regular rate and rhythm, no murmurs / rubs / gallops. No extremity edema. 2+ pedal pulses. No carotid  bruits.  Abdomen: no tenderness, no masses palpated. No hepatosplenomegaly. Bowel sounds positive.  Musculoskeletal: no clubbing / cyanosis. No joint deformity upper and lower extremities. Good ROM, no contractures. Normal muscle tone.  Skin: no rashes, lesions, ulcers. No induration Neurologic: grossly intact and nonfocal. Psychiatric: Normal judgment and insight. Alert and oriented x 3. Normal mood.    Impression and Plan:  Screening for malignant neoplasm of colon  - Plan: Ambulatory referral to Gastroenterology  Screening for osteoporosis  - Plan: DG Bone Density  Encounter for other screening for malignant neoplasm of breast  - Plan: MM Digital Screening  Pernicious anemia -Check B12 levels when she returns for her CPE.    Patient Instructions  -Nice seeing you today!!  -Schedule your physical. Please com e in fasting that day.     Lelon Frohlich, MD [facility]

## 2019-02-27 ENCOUNTER — Ambulatory Visit: Payer: Medicare Other | Attending: Internal Medicine

## 2019-02-27 DIAGNOSIS — Z20822 Contact with and (suspected) exposure to covid-19: Secondary | ICD-10-CM

## 2019-02-28 ENCOUNTER — Ambulatory Visit (INDEPENDENT_AMBULATORY_CARE_PROVIDER_SITE_OTHER): Payer: Medicare Other | Admitting: Family Medicine

## 2019-02-28 ENCOUNTER — Encounter: Payer: Self-pay | Admitting: Family Medicine

## 2019-02-28 DIAGNOSIS — Z7189 Other specified counseling: Secondary | ICD-10-CM | POA: Diagnosis not present

## 2019-02-28 DIAGNOSIS — Z20828 Contact with and (suspected) exposure to other viral communicable diseases: Secondary | ICD-10-CM | POA: Diagnosis not present

## 2019-02-28 DIAGNOSIS — J069 Acute upper respiratory infection, unspecified: Secondary | ICD-10-CM

## 2019-02-28 NOTE — Progress Notes (Signed)
Virtual Visit via Video Note  I connected with Jaime Fuller. Hem on 02/28/19 at 10:30 AM EST by a video enabled telemedicine application 2/2 [id number] pandemic and verified that I am speaking with the correct person using two identifiers.  Location patient: home Location provider:work or home office Persons participating in the virtual visit: patient, provider  I discussed the limitations of evaluation and management by telemedicine and the availability of in person appointments. The patient expressed understanding and agreed to proceed.   HPI: Pt is a 68 yo female with pmh sig for CKD, h/o renal calculi and pernicious anemia followed by Dr. Jerilee Hoh.  Pt seen for acute concern.    Pt  developed a cough on Monday, 12/14.  Pt had COVID testing on Tues 12/15, results pending.  Endorses a low grade fever, Tmax 99.4-99.59F, productive cough, fatigue, and decreased appetite.  Also notes rhinorrhea x 2 months. HA briefly with increased cough.  Denies ear pain or pressure, n/v, diarrhea, sick contacts, loss of taste or smell.  Taking Delsym BID prn.  Has not taken anything for the increased temp.  ROS: See pertinent positives and negatives per HPI.  Past Medical History:  Diagnosis Date  . Anxiety state, unspecified 09/20/2007  . Chronic kidney disease    kidney stones  . HYPOTHYROIDISM 03/08/2007  . Pancreatitis    secondary to ERCP    Past Surgical History:  Procedure Laterality Date  . abdominal plasty    . Carpel tunnel surgery left hand    . Childbirth x 2    . CHOLECYSTECTOMY    . GASTRIC BYPASS    . WISDOM TOOTH EXTRACTION      Family History  Problem Relation Age of Onset  . Lung cancer Father   . Heart attack Father        Died at 36     Current Outpatient Medications:  .  clonazePAM (KLONOPIN) 0.5 MG tablet, Take 1 tablet (0.5 mg total) 2 (two) times daily as needed by mouth for anxiety., Disp: 30 tablet, Rfl: 1 .  Cyanocobalamin (VITAMIN B12) 1000 MCG TBCR, Take 1,000  mcg at bedtime by mouth., Disp: , Rfl:  .  EPINEPHrine 0.3 mg/0.3 mL IJ SOAJ injection, Inject 0.3 mg into the muscle as needed for anaphylaxis., Disp: , Rfl:  .  valACYclovir (VALTREX) 1000 MG tablet, ONE TABLET TWICE A DAY X 5 DAYS AS NEEDED FOR OUTBREAK, Disp: 50 tablet, Rfl: 1  EXAM:  VITALS per patient if applicable:  RR between 12-20 bpm  GENERAL: alert, oriented, sitting comfortably in a recliner, appears well and in no acute distress  HEENT: atraumatic, conjunctiva clear, no obvious abnormalities on inspection of external nose and ears  NECK: normal movements of the head and neck  LUNGS: cough, on inspection no signs of respiratory distress, breathing rate appears normal, no obvious gross SOB, gasping or wheezing  CV: no obvious cyanosis  MS: moves all visible extremities without noticeable abnormality  PSYCH/NEURO: pleasant and cooperative, no obvious depression or anxiety, speech and thought processing grossly intact  ASSESSMENT AND PLAN:  Discussed the following assessment and plan:  Viral URI with cough -discussed possible causes including acute nasopharyngitis, influenza.  Cannot r/o COVID-19. -discussed supportive care.   -ok to conitnue delsym.  Consider tylenol for any aches, pains, discomfort. -hydration encouraged. -advised to continue self quarantining while COVID results pending -given precautions  Educated about COVID-19 virus infection -discussed s/s -covid test pending per chart review.  F/u prn   I  discussed the assessment and treatment plan with the patient. The patient was provided an opportunity to ask questions and all were answered. The patient agreed with the plan and demonstrated an understanding of the instructions.   The patient was advised to call back or seek an in-person evaluation if the symptoms worsen or if the condition fails to improve as anticipated.   Billie Ruddy, MD

## 2019-03-01 LAB — NOVEL CORONAVIRUS, NAA: SARS-CoV-2, NAA: NOT DETECTED

## 2019-03-27 ENCOUNTER — Ambulatory Visit
Admission: RE | Admit: 2019-03-27 | Discharge: 2019-03-27 | Disposition: A | Discharge status: Home / Self Care | Payer: Medicare Other | Source: Ambulatory Visit | Attending: Internal Medicine | Admitting: Internal Medicine

## 2019-03-27 ENCOUNTER — Other Ambulatory Visit: Payer: Self-pay | Admitting: Internal Medicine

## 2019-03-27 ENCOUNTER — Other Ambulatory Visit: Payer: Self-pay

## 2019-03-27 DIAGNOSIS — Z1239 Encounter for other screening for malignant neoplasm of breast: Secondary | ICD-10-CM

## 2019-03-27 DIAGNOSIS — Z1231 Encounter for screening mammogram for malignant neoplasm of breast: Secondary | ICD-10-CM

## 2019-03-27 DIAGNOSIS — Z13820 Encounter for screening for osteoporosis: Secondary | ICD-10-CM

## 2019-04-02 ENCOUNTER — Ambulatory Visit: Payer: Medicare Other | Attending: Internal Medicine

## 2019-04-02 DIAGNOSIS — Z23 Encounter for immunization: Secondary | ICD-10-CM

## 2019-04-02 NOTE — Progress Notes (Signed)
   Covid-19 Vaccination Clinic  Name:  Jaime Fuller    MRN: [id number] DOB: [birth year]  04/02/2019  [name] was observed post Covid-19 immunization for 15 minutes without incidence. She was provided with Vaccine Information Sheet and instruction to access the V-Safe system.   [name] was instructed to call 911 with any severe reactions post vaccine: Marland Kitchen Difficulty breathing  . Swelling of your face and throat  . A fast heartbeat  . A bad rash all over your body  . Dizziness and weakness    Immunizations Administered    Name Date Dose VIS Date Route   Pfizer COVID-19 Vaccine 04/02/2019  5:18 PM 0.3 mL 02/21/2019 Intramuscular   Manufacturer: [place]   Lot: [id number]   [place]: [id number]

## 2019-04-03 ENCOUNTER — Encounter: Payer: Self-pay | Admitting: Internal Medicine

## 2019-04-03 ENCOUNTER — Other Ambulatory Visit: Payer: Self-pay | Admitting: Internal Medicine

## 2019-04-03 ENCOUNTER — Other Ambulatory Visit: Payer: Self-pay

## 2019-04-03 ENCOUNTER — Encounter: Payer: Self-pay | Admitting: Gastroenterology

## 2019-04-03 ENCOUNTER — Ambulatory Visit (INDEPENDENT_AMBULATORY_CARE_PROVIDER_SITE_OTHER): Payer: Medicare Other | Admitting: Internal Medicine

## 2019-04-03 ENCOUNTER — Other Ambulatory Visit (HOSPITAL_COMMUNITY)
Admission: RE | Admit: 2019-04-03 | Discharge: 2019-04-03 | Disposition: A | Discharge status: Home / Self Care | Payer: Medicare Other | Source: Ambulatory Visit | Attending: Internal Medicine | Admitting: Internal Medicine

## 2019-04-03 VITALS — BP 110/70 | HR 67 | Temp 98.0°F | Ht 62.0 in | Wt 185.0 lb

## 2019-04-03 DIAGNOSIS — E559 Vitamin D deficiency, unspecified: Secondary | ICD-10-CM

## 2019-04-03 DIAGNOSIS — Z1151 Encounter for screening for human papillomavirus (HPV): Secondary | ICD-10-CM | POA: Insufficient documentation

## 2019-04-03 DIAGNOSIS — Z124 Encounter for screening for malignant neoplasm of cervix: Secondary | ICD-10-CM | POA: Diagnosis present

## 2019-04-03 DIAGNOSIS — Z0000 Encounter for general adult medical examination without abnormal findings: Secondary | ICD-10-CM | POA: Diagnosis not present

## 2019-04-03 DIAGNOSIS — T7840XD Allergy, unspecified, subsequent encounter: Secondary | ICD-10-CM

## 2019-04-03 DIAGNOSIS — Z1211 Encounter for screening for malignant neoplasm of colon: Secondary | ICD-10-CM | POA: Diagnosis not present

## 2019-04-03 DIAGNOSIS — D510 Vitamin B12 deficiency anemia due to intrinsic factor deficiency: Secondary | ICD-10-CM | POA: Diagnosis not present

## 2019-04-03 DIAGNOSIS — F411 Generalized anxiety disorder: Secondary | ICD-10-CM | POA: Diagnosis not present

## 2019-04-03 DIAGNOSIS — D509 Iron deficiency anemia, unspecified: Secondary | ICD-10-CM | POA: Insufficient documentation

## 2019-04-03 LAB — COMPREHENSIVE METABOLIC PANEL
ALT: 19 U/L (ref 0–35)
AST: 24 U/L (ref 0–37)
Albumin: 4 g/dL (ref 3.5–5.2)
Alkaline Phosphatase: 71 U/L (ref 39–117)
BUN: 22 mg/dL (ref 6–23)
CO2: 29 mEq/L (ref 19–32)
Calcium: 9 mg/dL (ref 8.4–10.5)
Chloride: 105 mEq/L (ref 96–112)
Creatinine, Ser: 0.87 mg/dL (ref 0.40–1.20)
GFR: 64.87 mL/min (ref >60.00)
Glucose, Bld: 90 mg/dL (ref 70–99)
Potassium: 4.6 mEq/L (ref 3.5–5.1)
Sodium: 140 mEq/L (ref 135–145)
Total Bilirubin: 0.5 mg/dL (ref 0.2–1.2)
Total Protein: 6.4 g/dL (ref 6.0–8.3)

## 2019-04-03 LAB — LIPID PANEL
Cholesterol: 141 mg/dL (ref 0–200)
HDL: 68.5 mg/dL (ref >39.00)
LDL Cholesterol: 63 mg/dL (ref 0–99)
NonHDL: 72.92
Total CHOL/HDL Ratio: 2
Triglycerides: 48 mg/dL (ref 0.0–149.0)
VLDL: 9.6 mg/dL (ref 0.0–40.0)

## 2019-04-03 LAB — CBC WITH DIFFERENTIAL/PLATELET
Basophils Absolute: 0 10*3/uL (ref 0.0–0.1)
Basophils Relative: 0.6 % (ref 0.0–3.0)
Eosinophils Absolute: 0.1 10*3/uL (ref 0.0–0.7)
Eosinophils Relative: 1.9 % (ref 0.0–5.0)
HCT: 34.1 % — ABNORMAL LOW (ref 36.0–46.0)
Hemoglobin: 10.8 g/dL — ABNORMAL LOW (ref 12.0–15.0)
Lymphocytes Relative: 34.4 % (ref 12.0–46.0)
Lymphs Abs: 1.8 10*3/uL (ref 0.7–4.0)
MCHC: 31.6 g/dL (ref 30.0–36.0)
MCV: 75.4 fl — ABNORMAL LOW (ref 78.0–100.0)
Monocytes Absolute: 0.5 10*3/uL (ref 0.1–1.0)
Monocytes Relative: 9 % (ref 3.0–12.0)
Neutro Abs: 2.9 10*3/uL (ref 1.4–7.7)
Neutrophils Relative %: 54.1 % (ref 43.0–77.0)
Platelets: 274 10*3/uL (ref 150.0–400.0)
RBC: 4.52 Mil/uL (ref 3.87–5.11)
RDW: 17.8 % — ABNORMAL HIGH (ref 11.5–15.5)
WBC: 5.4 10*3/uL (ref 4.0–10.5)

## 2019-04-03 LAB — VITAMIN D 25 HYDROXY (VIT D DEFICIENCY, FRACTURES): VITD: 29.76 ng/mL — ABNORMAL LOW (ref 30.00–100.00)

## 2019-04-03 LAB — VITAMIN B12: Vitamin B-12: >1500 pg/mL — ABNORMAL HIGH (ref 211–911)

## 2019-04-03 MED ORDER — EPINEPHRINE 0.3 MG/0.3ML IJ SOAJ: 0.3000 mg | INTRAMUSCULAR | 1 refills | Status: AC | PRN

## 2019-04-03 MED ORDER — CLONAZEPAM 0.5 MG PO TABS: 0.5000 mg | ORAL_TABLET | Freq: Two times a day (BID) | ORAL | 1 refills | Status: DC | PRN

## 2019-04-03 MED ORDER — FERROUS SULFATE 325 (65 FE) MG PO TBEC: 325.0000 mg | DELAYED_RELEASE_TABLET | Freq: Two times a day (BID) | ORAL | 3 refills | Status: DC

## 2019-04-03 MED ORDER — VITAMIN D (ERGOCALCIFEROL) 1.25 MG (50000 UNIT) PO CAPS: 50000.0000 [IU] | ORAL_CAPSULE | ORAL | 0 refills | Status: DC

## 2019-04-03 NOTE — Progress Notes (Signed)
Established Patient Office Visit     This visit occurred during the SARS-CoV-2 public health emergency.  Safety protocols were in place, including screening questions prior to the visit, additional usage of staff PPE, and extensive cleaning of exam room while observing appropriate contact time as indicated for disinfecting solutions.    CC/Reason for Visit: Subsequent Medicare wellness visit  HPI: Jaime Fuller is a 69 y.o. female who is coming in today for the above mentioned reasons. Past Medical History is significant for: History of pernicious anemia, generalized anxiety disorder on Klonopin, and chronic back pain tramadol.  She has no acute complaints today.  She got her first Covid vaccine yesterday.  She has routine eye and dental care.  She recently had a mammogram and a bone density test both of which were normal, she is due for screening colonoscopy and for Pap smear.   Past Medical/Surgical History: Past Medical History:  Diagnosis Date  . Anxiety state, unspecified 09/20/2007  . Chronic kidney disease    kidney stones  . HYPOTHYROIDISM 03/08/2007  . Pancreatitis    secondary to ERCP    Past Surgical History:  Procedure Laterality Date  . abdominal plasty    . Carpel tunnel surgery left hand    . Childbirth x 2    . CHOLECYSTECTOMY    . GASTRIC BYPASS    . WISDOM TOOTH EXTRACTION      Social History:  reports that she has quit smoking. She has never used smokeless tobacco. She reports that she does not drink alcohol or use drugs.  Allergies: Allergies  Allergen Reactions  . Vicodin [Hydrocodone-Acetaminophen] Itching    Family History:  Family History  Problem Relation Age of Onset  . Lung cancer Father   . Heart attack Father        Died at 42     Current Outpatient Medications:  .  clonazePAM (KLONOPIN) 0.5 MG tablet, Take 1 tablet (0.5 mg total) by mouth 2 (two) times daily as needed for anxiety., Disp: 30 tablet, Rfl: 1 .  Cyanocobalamin  (VITAMIN B12) 1000 MCG TBCR, Take 1,000 mcg at bedtime by mouth., Disp: , Rfl:  .  EPINEPHrine 0.3 mg/0.3 mL IJ SOAJ injection, Inject 0.3 mLs (0.3 mg total) into the muscle as needed for anaphylaxis., Disp: 1 each, Rfl: 1 .  valACYclovir (VALTREX) 1000 MG tablet, ONE TABLET TWICE A DAY X 5 DAYS AS NEEDED FOR OUTBREAK, Disp: 50 tablet, Rfl: 1  Review of Systems:  Constitutional: Denies fever, chills, diaphoresis, appetite change and fatigue.  HEENT: Denies photophobia, eye pain, redness, hearing loss, ear pain, congestion, sore throat, rhinorrhea, sneezing, mouth sores, trouble swallowing, neck pain, neck stiffness and tinnitus.   Respiratory: Denies SOB, DOE, cough, chest tightness,  and wheezing.   Cardiovascular: Denies chest pain, palpitations and leg swelling.  Gastrointestinal: Denies nausea, vomiting, abdominal pain, diarrhea, constipation, blood in stool and abdominal distention.  Genitourinary: Denies dysuria, urgency, frequency, hematuria, flank pain and difficulty urinating.  Endocrine: Denies: hot or cold intolerance, sweats, changes in hair or nails, polyuria, polydipsia. Musculoskeletal: Denies myalgias, back pain, joint swelling, arthralgias and gait problem.  Skin: Denies pallor, rash and wound.  Neurological: Denies dizziness, seizures, syncope, weakness, light-headedness, numbness and headaches.  Hematological: Denies adenopathy. Easy bruising, personal or family bleeding history  Psychiatric/Behavioral: Denies suicidal ideation, mood changes, confusion, nervousness, sleep disturbance and agitation    Physical Exam: Vitals:   04/03/19 0701  BP: 110/70  Pulse: 67  Temp:  98 F (36.7 C)  TempSrc: Temporal  SpO2: 97%  Weight: 185 lb (83.9 kg)  Height: _0  (1.575 m)    Body mass index is 33.84 kg/m.   Constitutional: NAD, calm, comfortable Eyes: PERRL, lids and conjunctivae normal ENMT: Mucous membranes are moist.Tympanic membrane is pearly white, no erythema or  bulging. Neck: normal, supple, no masses, no thyromegaly Respiratory: clear to auscultation bilaterally, no wheezing, no crackles. Normal respiratory effort. No accessory muscle use.  Cardiovascular: Regular rate and rhythm, no murmurs / rubs / gallops. No extremity edema. 2+ pedal pulses. No carotid bruits.  Abdomen: no tenderness, no masses palpated. No hepatosplenomegaly. Bowel sounds positive.  Musculoskeletal: no clubbing / cyanosis. No joint deformity upper and lower extremities. Good ROM, no contractures. Normal muscle tone.  Skin: no rashes, lesions, ulcers. No induration Neurologic: CN 2-12 grossly intact. Sensation intact, DTR normal. Strength 5/5 in all 4.  Psychiatric: Normal judgment and insight. Alert and oriented x 3. Normal mood.    Subsequent Medicare wellness visit   1. Risk factors, based on past  M,S,F -cardiovascular disease risk factors include age only   2.  Physical activities: Yardwork and housework   3.  Depression/mood:  Stable, not depressed   4.  Hearing:  No issues   5.  ADL's: Independent in all ADLs   6.  Fall risk:  Low fall risk   7.  Home safety: No problems identified   8.  Height weight, and visual acuity: Height and weight as above, visual acuity is 20/20 with eyes independently and together with correction   9.  Counseling:  Advised to increase physical activity mild to moderate weight loss   10. Lab orders based on risk factors: Laboratory update will be reviewed   11. Referral :  None today   12. Care plan:  Follow-up in 6 months   13. Cognitive assessment:  No cognitive impairment   14. Screening: Patient provided with a written and personalized 5-10 year screening schedule in the AVS.   yes   15. Provider List Update:   PCP only  16. Advance Directives: Full code     Office Visit from 01/01/2019 in [place] at [facility]  PHQ-9 Total Score  1      Fall Risk  01/01/2019 10/09/2018 01/29/2017  Falls in the past  year? 0 (No Data) No  Comment - Emmi Telephone Survey: data to providers prior to load -  Number falls in past yr: 0 (No Data) -  Comment - Emmi Telephone Survey Actual Response =  -  Injury with Fall? 0 - -      Impression and Plan:  Encounter for preventive health examination -She has routine eye care. -All immunizations are up-to-date, she is due for second shingles after February, will receive second Covid vaccine in about 3 weeks. -Screening labs today. -Healthy lifestyle has been discussed in detail. -Mammogram normal earlier this month. -Pap smear in office today for cervical cancer screening. -GI referral for screening colonoscopy for which she is overdue.  Screening for malignant neoplasm of colon  - Plan: Ambulatory referral to Gastroenterology  Pernicious anemia -Check B12 levels today.  GAD (generalized anxiety disorder)  - Plan: clonazePAM (KLONOPIN) 0.5 MG tablet  Allergic reaction, subsequent encounter  - Plan: EPINEPHrine 0.3 mg/0.3 mL IJ SOAJ injection    Patient Instructions  -Nice seeing you today!!  -Lab work today; will notify you once results are available.  -Schedule follow up in 6 months.  Preventive Care 56 Years and Older, Female Preventive care refers to lifestyle choices and visits with your health care provider that can promote health and wellness. This includes:  A yearly physical exam. This is also called an annual well check.  Regular dental and eye exams.  Immunizations.  Screening for certain conditions.  Healthy lifestyle choices, such as diet and exercise. What can I expect for my preventive care visit? Physical exam Your health care provider will check:  Height and weight. These may be used to calculate body mass index (BMI), which is a measurement that tells if you are at a healthy weight.  Heart rate and blood pressure.  Your skin for abnormal spots. Counseling Your health care provider may ask you questions  about:  Alcohol, tobacco, and drug use.  Emotional well-being.  Home and relationship well-being.  Sexual activity.  Eating habits.  History of falls.  Memory and ability to understand (cognition).  Work and work Statistician.  Pregnancy and menstrual history. What immunizations do I need?  Influenza (flu) vaccine  This is recommended every year. Tetanus, diphtheria, and pertussis (Tdap) vaccine  You may need a Td booster every 10 years. Varicella (chickenpox) vaccine  You may need this vaccine if you have not already been vaccinated. Zoster (shingles) vaccine  You may need this after age 46. Pneumococcal conjugate (PCV13) vaccine  One dose is recommended after age 44. Pneumococcal polysaccharide (PPSV23) vaccine  One dose is recommended after age 7. Measles, mumps, and rubella (MMR) vaccine  You may need at least one dose of MMR if you were born in 1957 or later. You may also need a second dose. Meningococcal conjugate (MenACWY) vaccine  You may need this if you have certain conditions. Hepatitis A vaccine  You may need this if you have certain conditions or if you travel or work in places where you may be exposed to hepatitis A. Hepatitis B vaccine  You may need this if you have certain conditions or if you travel or work in places where you may be exposed to hepatitis B. Haemophilus influenzae type b (Hib) vaccine  You may need this if you have certain conditions. You may receive vaccines as individual doses or as more than one vaccine together in one shot (combination vaccines). Talk with your health care provider about the risks and benefits of combination vaccines. What tests do I need? Blood tests  Lipid and cholesterol levels. These may be checked every 5 years, or more frequently depending on your overall health.  Hepatitis C test.  Hepatitis B test. Screening  Lung cancer screening. You may have this screening every year starting at age 102 if  you have a 30-pack-year history of smoking and currently smoke or have quit within the past 15 years.  Colorectal cancer screening. All adults should have this screening starting at age 24 and continuing until age 41. Your health care provider may recommend screening at age 73 if you are at increased risk. You will have tests every 1-10 years, depending on your results and the type of screening test.  Diabetes screening. This is done by checking your blood sugar (glucose) after you have not eaten for a while (fasting). You may have this done every 1-3 years.  Mammogram. This may be done every 1-2 years. Talk with your health care provider about how often you should have regular mammograms.  BRCA-related cancer screening. This may be done if you have a family history of breast, ovarian, tubal, or peritoneal  cancers. Other tests  Sexually transmitted disease (STD) testing.  Bone density scan. This is done to screen for osteoporosis. You may have this done starting at age 64. Follow these instructions at home: Eating and drinking  Eat a diet that includes fresh fruits and vegetables, whole grains, lean protein, and low-fat dairy products. Limit your intake of foods with high amounts of sugar, saturated fats, and salt.  Take vitamin and mineral supplements as recommended by your health care provider.  Do not drink alcohol if your health care provider tells you not to drink.  If you drink alcohol: ? Limit how much you have to 0-1 drink a day. ? Be aware of how much alcohol is in your drink. In the [place] one drink equals one 12 oz bottle of beer (355 mL), one 5 oz glass of wine (148 mL), or one 1 oz glass of hard liquor (44 mL). Lifestyle  Take daily care of your teeth and gums.  Stay active. Exercise for at least 30 minutes on 5 or more days each week.  Do not use any products that contain nicotine or tobacco, such as cigarettes, e-cigarettes, and chewing tobacco. If you need help  quitting, ask your health care provider.  If you are sexually active, practice safe sex. Use a condom or other form of protection in order to prevent STIs (sexually transmitted infections).  Talk with your health care provider about taking a low-dose aspirin or statin. What's next?  Go to your health care provider once a year for a well check visit.  Ask your health care provider how often you should have your eyes and teeth checked.  Stay up to date on all vaccines. This information is not intended to replace advice given to you by your health care provider. Make sure you discuss any questions you have with your health care provider. Document Revised: 02/21/2018 Document Reviewed: 02/21/2018 Elsevier Patient Education  2020 [place], [place] [facility]

## 2019-04-03 NOTE — Patient Instructions (Signed)
-Nice seeing you today!!  -Lab work today; will notify you once results are available.  -Schedule follow up in 6 months.   Preventive Care 65 Years and Older, Female Preventive care refers to lifestyle choices and visits with your health care provider that can promote health and wellness. This includes:  A yearly physical exam. This is also called an annual well check.  Regular dental and eye exams.  Immunizations.  Screening for certain conditions.  Healthy lifestyle choices, such as diet and exercise. What can I expect for my preventive care visit? Physical exam Your health care provider will check:  Height and weight. These may be used to calculate body mass index (BMI), which is a measurement that tells if you are at a healthy weight.  Heart rate and blood pressure.  Your skin for abnormal spots. Counseling Your health care provider may ask you questions about:  Alcohol, tobacco, and drug use.  Emotional well-being.  Home and relationship well-being.  Sexual activity.  Eating habits.  History of falls.  Memory and ability to understand (cognition).  Work and work environment.  Pregnancy and menstrual history. What immunizations do I need?  Influenza (flu) vaccine  This is recommended every year. Tetanus, diphtheria, and pertussis (Tdap) vaccine  You may need a Td booster every 10 years. Varicella (chickenpox) vaccine  You may need this vaccine if you have not already been vaccinated. Zoster (shingles) vaccine  You may need this after age 60. Pneumococcal conjugate (PCV13) vaccine  One dose is recommended after age 65. Pneumococcal polysaccharide (PPSV23) vaccine  One dose is recommended after age 65. Measles, mumps, and rubella (MMR) vaccine  You may need at least one dose of MMR if you were born in 1957 or later. You may also need a second dose. Meningococcal conjugate (MenACWY) vaccine  You may need this if you have certain  conditions. Hepatitis A vaccine  You may need this if you have certain conditions or if you travel or work in places where you may be exposed to hepatitis A. Hepatitis B vaccine  You may need this if you have certain conditions or if you travel or work in places where you may be exposed to hepatitis B. Haemophilus influenzae type b (Hib) vaccine  You may need this if you have certain conditions. You may receive vaccines as individual doses or as more than one vaccine together in one shot (combination vaccines). Talk with your health care provider about the risks and benefits of combination vaccines. What tests do I need? Blood tests  Lipid and cholesterol levels. These may be checked every 5 years, or more frequently depending on your overall health.  Hepatitis C test.  Hepatitis B test. Screening  Lung cancer screening. You may have this screening every year starting at age 55 if you have a 30-pack-year history of smoking and currently smoke or have quit within the past 15 years.  Colorectal cancer screening. All adults should have this screening starting at age 50 and continuing until age 75. Your health care provider may recommend screening at age 45 if you are at increased risk. You will have tests every 1-10 years, depending on your results and the type of screening test.  Diabetes screening. This is done by checking your blood sugar (glucose) after you have not eaten for a while (fasting). You may have this done every 1-3 years.  Mammogram. This may be done every 1-2 years. Talk with your health care provider about how often you should   have regular mammograms.  BRCA-related cancer screening. This may be done if you have a family history of breast, ovarian, tubal, or peritoneal cancers. Other tests  Sexually transmitted disease (STD) testing.  Bone density scan. This is done to screen for osteoporosis. You may have this done starting at age 65. Follow these instructions at  home: Eating and drinking  Eat a diet that includes fresh fruits and vegetables, whole grains, lean protein, and low-fat dairy products. Limit your intake of foods with high amounts of sugar, saturated fats, and salt.  Take vitamin and mineral supplements as recommended by your health care provider.  Do not drink alcohol if your health care provider tells you not to drink.  If you drink alcohol: ? Limit how much you have to 0-1 drink a day. ? Be aware of how much alcohol is in your drink. In the [place] one drink equals one 12 oz bottle of beer (355 mL), one 5 oz glass of wine (148 mL), or one 1 oz glass of hard liquor (44 mL). Lifestyle  Take daily care of your teeth and gums.  Stay active. Exercise for at least 30 minutes on 5 or more days each week.  Do not use any products that contain nicotine or tobacco, such as cigarettes, e-cigarettes, and chewing tobacco. If you need help quitting, ask your health care provider.  If you are sexually active, practice safe sex. Use a condom or other form of protection in order to prevent STIs (sexually transmitted infections).  Talk with your health care provider about taking a low-dose aspirin or statin. What's next?  Go to your health care provider once a year for a well check visit.  Ask your health care provider how often you should have your eyes and teeth checked.  Stay up to date on all vaccines. This information is not intended to replace advice given to you by your health care provider. Make sure you discuss any questions you have with your health care provider. Document Revised: 02/21/2018 Document Reviewed: 02/21/2018 Elsevier Patient Education  2020 Elsevier Inc[employer]  

## 2019-04-07 DIAGNOSIS — K859 Acute pancreatitis without necrosis or infection, unspecified: Secondary | ICD-10-CM | POA: Insufficient documentation

## 2019-04-07 LAB — CYTOLOGY - PAP
Comment: NEGATIVE
Diagnosis: NEGATIVE
High risk HPV: NEGATIVE

## 2019-04-09 ENCOUNTER — Telehealth: Payer: Self-pay | Admitting: *Deleted

## 2019-04-09 DIAGNOSIS — E559 Vitamin D deficiency, unspecified: Secondary | ICD-10-CM

## 2019-04-09 NOTE — Telephone Encounter (Signed)
Patient complaining of left neck pain.  She states it started when she got her Covid vaccine.  Patient was seen in the office.  The pain had improved but now it is back with some difficulty swallowing.  Please advise.

## 2019-04-09 NOTE — Telephone Encounter (Signed)
Patient is aware 

## 2019-04-09 NOTE — Telephone Encounter (Signed)
Would advise NSAIDs for now. If difficulty swallowing progresses needs OV.

## 2019-04-15 ENCOUNTER — Other Ambulatory Visit: Payer: Self-pay

## 2019-04-15 ENCOUNTER — Ambulatory Visit (AMBULATORY_SURGERY_CENTER): Payer: Self-pay | Admitting: *Deleted

## 2019-04-15 VITALS — Temp 96.4°F | Ht 62.0 in | Wt 186.0 lb

## 2019-04-15 DIAGNOSIS — Z01818 Encounter for other preprocedural examination: Secondary | ICD-10-CM

## 2019-04-15 DIAGNOSIS — K851 Biliary acute pancreatitis without necrosis or infection: Secondary | ICD-10-CM

## 2019-04-15 DIAGNOSIS — Z1211 Encounter for screening for malignant neoplasm of colon: Secondary | ICD-10-CM

## 2019-04-15 NOTE — Progress Notes (Signed)
Pt is aware that care partner will wait in the car during procedure; if they feel like they will be too hot or cold to wait in the car; they may wait in the 4 th floor lobby. Patient is aware to bring only one care partner. We want them to wear a mask (we do not have any that we can provide them), practice social distancing, and we will check their temperatures when they get here.  I did remind the patient that their care partner needs to stay in the parking lot the entire time and have a cell phone available, we will call them when the pt is ready for discharge. Patient will wear mask into building.   No egg or soy allergy  No home oxygen use or problems with anesthesia  No medications for weight loss taken  emmi information given  covid test 04-24-19 at 835 am

## 2019-04-20 ENCOUNTER — Encounter: Payer: Self-pay | Admitting: Internal Medicine

## 2019-04-20 DIAGNOSIS — E559 Vitamin D deficiency, unspecified: Secondary | ICD-10-CM

## 2019-04-22 MED ORDER — VITAMIN D (ERGOCALCIFEROL) 1.25 MG (50000 UNIT) PO CAPS: 50000.0000 [IU] | ORAL_CAPSULE | ORAL | 1 refills | Status: DC

## 2019-04-23 ENCOUNTER — Ambulatory Visit: Payer: Medicare Other

## 2019-04-25 ENCOUNTER — Encounter: Payer: Self-pay | Admitting: Internal Medicine

## 2019-04-29 ENCOUNTER — Encounter: Payer: Medicare Other | Admitting: Gastroenterology

## 2019-05-05 ENCOUNTER — Ambulatory Visit: Payer: Medicare Other

## 2019-05-06 NOTE — Telephone Encounter (Signed)
Called and spoke with patient and she is "feeling better today".  Patient will get her 2nd Covid vaccine 05/08/19 and will call back if needed.

## 2019-05-08 ENCOUNTER — Ambulatory Visit: Payer: Medicare Other | Attending: Internal Medicine

## 2019-05-08 DIAGNOSIS — Z23 Encounter for immunization: Secondary | ICD-10-CM

## 2019-05-08 NOTE — Progress Notes (Signed)
   Covid-19 Vaccination Clinic  Name:  Jaime Fuller    MRN: [id number] DOB: 1950/08/09  05/08/2019  Jaime Fuller was observed post Covid-19 immunization for 15 minutes without incidence. She was provided with Vaccine Information Sheet and instruction to access the V-Safe system.   Jaime Fuller was instructed to call 911 with any severe reactions post vaccine: Marland Kitchen Difficulty breathing  . Swelling of your face and throat  . A fast heartbeat  . A bad rash all over your body  . Dizziness and weakness    Immunizations Administered    Name Date Dose VIS Date Route   Pfizer COVID-19 Vaccine 05/08/2019  8:46 AM 0.3 mL 02/21/2019 Intramuscular   Manufacturer: [place]   Lot: [id number]   [place]: [id number]

## 2019-05-21 ENCOUNTER — Ambulatory Visit (INDEPENDENT_AMBULATORY_CARE_PROVIDER_SITE_OTHER): Payer: Medicare Other

## 2019-05-21 ENCOUNTER — Other Ambulatory Visit: Payer: Self-pay | Admitting: Gastroenterology

## 2019-05-21 DIAGNOSIS — Z1159 Encounter for screening for other viral diseases: Secondary | ICD-10-CM

## 2019-05-21 LAB — SARS CORONAVIRUS 2 (TAT 6-24 HRS): SARS Coronavirus 2: NEGATIVE

## 2019-05-23 ENCOUNTER — Other Ambulatory Visit: Payer: Self-pay

## 2019-05-23 ENCOUNTER — Ambulatory Visit (AMBULATORY_SURGERY_CENTER): Payer: Medicare Other | Admitting: Gastroenterology

## 2019-05-23 ENCOUNTER — Encounter: Payer: Self-pay | Admitting: Gastroenterology

## 2019-05-23 VITALS — BP 104/62 | HR 51 | Temp 97.3°F | Resp 14 | Ht 62.0 in | Wt 186.0 lb

## 2019-05-23 DIAGNOSIS — D124 Benign neoplasm of descending colon: Secondary | ICD-10-CM

## 2019-05-23 DIAGNOSIS — Z1211 Encounter for screening for malignant neoplasm of colon: Secondary | ICD-10-CM

## 2019-05-23 DIAGNOSIS — D122 Benign neoplasm of ascending colon: Secondary | ICD-10-CM | POA: Diagnosis not present

## 2019-05-23 MED ORDER — SODIUM CHLORIDE 0.9 % IV SOLN: 500.0000 mL | Freq: Once | INTRAVENOUS | Status: DC

## 2019-05-23 NOTE — Patient Instructions (Signed)
Handouts given for high fiber diet, diverticulosis and polyps.  YOU HAD AN ENDOSCOPIC PROCEDURE TODAY AT THE [facility]:   Refer to the procedure report that was given to you for any specific questions about what was found during the examination.  If the procedure report does not answer your questions, please call your gastroenterologist to clarify.  If you requested that your care partner not be given the details of your procedure findings, then the procedure report has been included in a sealed envelope for you to review at your convenience later.  YOU SHOULD EXPECT: Some feelings of bloating in the abdomen. Passage of more gas than usual.  Walking can help get rid of the air that was put into your GI tract during the procedure and reduce the bloating. If you had a lower endoscopy (such as a colonoscopy or flexible sigmoidoscopy) you may notice spotting of blood in your stool or on the toilet paper. If you underwent a bowel prep for your procedure, you may not have a normal bowel movement for a few days.  Please Note:  You might notice some irritation and congestion in your nose or some drainage.  This is from the oxygen used during your procedure.  There is no need for concern and it should clear up in a day or so.  SYMPTOMS TO REPORT IMMEDIATELY:  Following lower endoscopy (colonoscopy or flexible sigmoidoscopy):  Excessive amounts of blood in the stool  Significant tenderness or worsening of abdominal pains  Swelling of the abdomen that is new, acute  Fever of 100F or higher  For urgent or emergent issues, a gastroenterologist can be reached at any hour by calling [phone]. Do not use MyChart messaging for urgent concerns.    DIET:  We do recommend a small meal at first, but then you may proceed to your regular diet.  Drink plenty of fluids but you should avoid alcoholic beverages for 24 hours.  ACTIVITY:  You should plan to take it easy for the rest of today and you  should NOT DRIVE or use heavy machinery until tomorrow (because of the sedation medicines used during the test).    FOLLOW UP: Our staff will call the number listed on your records 48-72 hours following your procedure to check on you and address any questions or concerns that you may have regarding the information given to you following your procedure. If we do not reach you, we will leave a message.  We will attempt to reach you two times.  During this call, we will ask if you have developed any symptoms of COVID 19. If you develop any symptoms (ie: fever, flu-like symptoms, shortness of breath, cough etc.) before then, please call [phone].  If you test positive for Covid 19 in the 2 weeks post procedure, please call and report this information to us.    If any biopsies were taken you will be contacted by phone or by letter within the next 1-3 weeks.  Please call us at [phone] if you have not heard about the biopsies in 3 weeks.    SIGNATURES/CONFIDENTIALITY: You and/or your care partner have signed paperwork which will be entered into your electronic medical record.  These signatures attest to the fact that that the information above on your After Visit Summary has been reviewed and is understood.  Full responsibility of the confidentiality of this discharge information lies with you and/or your care-partner.  

## 2019-05-23 NOTE — Progress Notes (Signed)
Pt's states no medical or surgical changes since previsit or office visit.  Temp taken by LC VS taken by CW  

## 2019-05-23 NOTE — Op Note (Signed)
[facility] Patient Name: Jaime Fuller Procedure Date: 05/23/2019 10:44 AM MRN: [id number] Endoscopist: Thornton Park MD, MD Age: 69 Referring MD:  Date of Birth: [birth year] Gender: Female Account #: [id number] Procedure:                Colonoscopy Indications:              Screening for colorectal malignant neoplasm                           No known family history of colon cancer or polyps                           Prior normal screening colonoscopy >10 years ago Medicines:                Monitored Anesthesia Care Procedure:                Pre-Anesthesia Assessment:                           - Prior to the procedure, a History and Physical                            was performed, and patient medications and                            allergies were reviewed. The patient's tolerance of                            previous anesthesia was also reviewed. The risks                            and benefits of the procedure and the sedation                            options and risks were discussed with the patient.                            All questions were answered, and informed consent                            was obtained. Prior Anticoagulants: The patient has                            taken no previous anticoagulant or antiplatelet                            agents. ASA Grade Assessment: II - A patient with                            mild systemic disease. After reviewing the risks                            and benefits, the patient was deemed in  satisfactory condition to undergo the procedure.                           After obtaining informed consent, the colonoscope                            was passed under direct vision. Throughout the                            procedure, the patient's blood pressure, pulse, and                            oxygen saturations were monitored continuously. The                            Colonoscope was  introduced through the anus and                            advanced to the 3 cm into the ileum. A second                            forward view of the right colon was performed. The                            colonoscopy was performed without difficulty. The                            patient tolerated the procedure well. The quality                            of the bowel preparation was good. The terminal                            ileum, ileocecal valve, appendiceal orifice, and                            rectum were photographed. Scope In: 10:55:58 AM Scope Out: 11:10:15 AM Scope Withdrawal Time: 0 hours 11 minutes 35 seconds  Total Procedure Duration: 0 hours 14 minutes 17 seconds  Findings:                 The perianal and digital rectal examinations were                            normal.                           Multiple small and large-mouthed diverticula were                            found in the sigmoid colon and descending colon.                           A 1 mm polyp was found in the descending colon. The  polyp was sessile. The polyp was removed with a                            cold snare. Resection and retrieval were complete.                            Estimated blood loss was minimal.                           Three sessile polyps were found in the ascending                            colon. The polyps were 1 to 5 mm in size. The                            largest polyp was nearly flat. These polyps were                            removed with a cold snare. Resection and retrieval                            were complete. Estimated blood loss was minimal.                           The exam was otherwise without abnormality. Complications:            No immediate complications. Estimated blood loss:                            Minimal. Estimated Blood Loss:     Estimated blood loss was minimal. Impression:               - Diverticulosis in  the sigmoid colon and in the                            descending colon.                           - One 1 mm polyp in the descending colon, removed                            with a cold snare. Resected and retrieved.                           - Three 1 to 5 mm polyps in the ascending colon,                            removed with a cold snare. Resected and retrieved.                           - The examination was otherwise normal. Recommendation:           - Patient has a contact number available for  emergencies. The signs and symptoms of potential                            delayed complications were discussed with the                            patient. Return to normal activities tomorrow.                            Written discharge instructions were provided to the                            patient.                           - Await pathology results.                           - Repeat colonoscopy date to be determined after                            pending pathology results are reviewed for                            surveillance.                           - Continue present medications.                           - Follow a high fiber diet. Drink at least 64                            ounces of water daily. Add a daily stool bulking                            agent such as psyllium (an exampled would be                            Metamucil).                           - Emerging evidence supports eating a diet of                            fruits, vegetables, grains, calcium, and yogurt                            while reducing red meat and alcohol may reduce the                            risk of colon cancer.                           - Thank you for allowing me to be involved in your  colon cancer prevention. Thornton Park MD, MD 05/23/2019 11:17:31 AM This report has been signed electronically.

## 2019-05-23 NOTE — Progress Notes (Signed)
Called to room to assist during endoscopic procedure.  Patient ID and intended procedure confirmed with present staff. Received instructions for my participation in the procedure from the performing physician.  

## 2019-05-23 NOTE — Progress Notes (Signed)
Report to PACU, RN, vss, BBS= Clear.  

## 2019-05-27 ENCOUNTER — Telehealth: Payer: Self-pay

## 2019-05-27 ENCOUNTER — Telehealth: Payer: Self-pay | Admitting: Internal Medicine

## 2019-05-27 MED ORDER — VALACYCLOVIR HCL 1 G PO TABS: ORAL_TABLET | ORAL | 1 refills | Status: AC

## 2019-05-27 NOTE — Telephone Encounter (Signed)
Pt is calling in wanting to get a refill on valacyclovir (VALTREX) 1000 MG for her mouth full of cold sores that she got when she had her colonoscopy.  Pharm:   [facility]

## 2019-05-27 NOTE — Telephone Encounter (Signed)
  Follow up Call-  Call back number 05/23/2019  Post procedure Call Back phone  # [phone]  Permission to leave phone message Yes  Some recent data might be hidden     Patient questions:  Do you have a fever, pain , or abdominal swelling? No. Pain Score  0 *  Have you tolerated food without any problems? Yes.    Have you been able to return to your normal activities? Yes.    Do you have any questions about your discharge instructions: Diet   No. Medications  No. Follow up visit  No.  Do you have questions or concerns about your Care? No.  Actions: * If pain score is 4 or above: No action needed, pain <4.  1. Have you developed a fever since your procedure? no  2.   Have you had an respiratory symptoms (SOB or cough) since your procedure? no  3.   Have you tested positive for COVID 19 since your procedure no  4.   Have you had any family members/close contacts diagnosed with the COVID 19 since your procedure?  no   If yes to any of these questions please route to Joylene John, RN and Alphonsa Gin, Therapist, sports.

## 2019-05-29 ENCOUNTER — Encounter: Payer: Self-pay | Admitting: Gastroenterology

## 2019-06-18 ENCOUNTER — Other Ambulatory Visit: Payer: Self-pay | Admitting: Internal Medicine

## 2019-06-18 ENCOUNTER — Ambulatory Visit (INDEPENDENT_AMBULATORY_CARE_PROVIDER_SITE_OTHER): Payer: Medicare Other | Admitting: Internal Medicine

## 2019-06-18 ENCOUNTER — Other Ambulatory Visit: Payer: Self-pay

## 2019-06-18 ENCOUNTER — Encounter: Payer: Self-pay | Admitting: Internal Medicine

## 2019-06-18 VITALS — BP 104/70 | HR 59 | Temp 97.4°F | Wt 186.2 lb

## 2019-06-18 DIAGNOSIS — R221 Localized swelling, mass and lump, neck: Secondary | ICD-10-CM | POA: Diagnosis not present

## 2019-06-18 DIAGNOSIS — E039 Hypothyroidism, unspecified: Secondary | ICD-10-CM | POA: Diagnosis not present

## 2019-06-18 DIAGNOSIS — T17308D Unspecified foreign body in larynx causing other injury, subsequent encounter: Secondary | ICD-10-CM | POA: Diagnosis not present

## 2019-06-18 DIAGNOSIS — E559 Vitamin D deficiency, unspecified: Secondary | ICD-10-CM

## 2019-06-18 LAB — TSH: TSH: 6.44 u[IU]/mL — ABNORMAL HIGH (ref 0.35–4.50)

## 2019-06-18 LAB — T3, FREE: T3, Free: 3.2 pg/mL (ref 2.3–4.2)

## 2019-06-18 LAB — T4, FREE: Free T4: 0.73 ng/dL (ref 0.60–1.60)

## 2019-06-18 MED ORDER — LEVOTHYROXINE SODIUM 25 MCG PO TABS: 25.0000 ug | ORAL_TABLET | Freq: Every day | ORAL | 1 refills | Status: DC

## 2019-06-18 MED ORDER — VITAMIN D (ERGOCALCIFEROL) 1.25 MG (50000 UNIT) PO CAPS: 50000.0000 [IU] | ORAL_CAPSULE | ORAL | 1 refills | Status: AC

## 2019-06-18 NOTE — Patient Instructions (Signed)
-  Nice seeing you today!!  -Lab work today; will notify you once results are available.

## 2019-06-18 NOTE — Addendum Note (Signed)
Addended by: Suzette Battiest on: 06/18/2019 08:28 AM   Modules accepted: Orders

## 2019-06-18 NOTE — Progress Notes (Signed)
Established Patient Office Visit     This visit occurred during the SARS-CoV-2 public health emergency.  Safety protocols were in place, including screening questions prior to the visit, additional usage of staff PPE, and extensive cleaning of exam room while observing appropriate contact time as indicated for disinfecting solutions.    CC/Reason for Visit: "Neck swelling, sensation of choking"  HPI: Jaime Fuller is a 69 y.o. female who is coming in today for the above mentioned reasons. Past Medical History is significant for: History of pernicious anemia, generalized anxiety disorder on Klonopin, and chronic back pain tramadol.  She states she had a prior history of hypothyroidism but was taken off levothyroxine after she lost a lot of weight for her gastric bypass surgery.  Last TSH documented in chart is from 2018 at that time it was 6 (high).  She feels for the past 2 to 3 weeks she has been having swelling in the anterior part of her neck, at times feels like there is a knot there, she also feels like she is choking on her own saliva.  Never is choked with food.  She denies any weight gain, weight loss, hair loss, heat intolerance or cold intolerance, constipation or diarrhea.   Past Medical/Surgical History: Past Medical History:  Diagnosis Date  . Anxiety state, unspecified 09/20/2007  . Arthritis   . Cataract   . Chronic kidney disease    kidney stones  . HYPOTHYROIDISM 03/08/2007   no meds needed now 04-15-19  . Pancreatitis    secondary to ERCP    Past Surgical History:  Procedure Laterality Date  . abdominal plasty    . Carpel tunnel surgery left hand    . Childbirth x 2    . CHOLECYSTECTOMY    . GASTRIC BYPASS    . UPPER GASTROINTESTINAL ENDOSCOPY    . WISDOM TOOTH EXTRACTION      Social History:  reports that she has quit smoking. She has never used smokeless tobacco. She reports that she does not drink alcohol or use drugs.  Allergies: Allergies    Allergen Reactions  . Vicodin [Hydrocodone-Acetaminophen] Itching  . Bee Venom     anaphylactic    Family History:  Family History  Problem Relation Age of Onset  . Lung cancer Father   . Heart attack Father        Died at 29  . Colon cancer Neg Hx   . Esophageal cancer Neg Hx   . Rectal cancer Neg Hx   . Stomach cancer Neg Hx      Current Outpatient Medications:  .  clonazePAM (KLONOPIN) 0.5 MG tablet, Take 1 tablet (0.5 mg total) by mouth 2 (two) times daily as needed for anxiety., Disp: 30 tablet, Rfl: 1 .  Cyanocobalamin (VITAMIN B12) 1000 MCG TBCR, Take 1,000 mcg at bedtime by mouth., Disp: , Rfl:  .  EPINEPHrine 0.3 mg/0.3 mL IJ SOAJ injection, Inject 0.3 mLs (0.3 mg total) into the muscle as needed for anaphylaxis., Disp: 1 each, Rfl: 1 .  ferrous sulfate 325 (65 FE) MG EC tablet, Take 1 tablet (325 mg total) by mouth 2 (two) times daily with breakfast and lunch., Disp: , Rfl: 3 .  valACYclovir (VALTREX) 1000 MG tablet, ONE TABLET TWICE A DAY X 5 DAYS AS NEEDED FOR OUTBREAK, Disp: 50 tablet, Rfl: 1 .  Vitamin D, Ergocalciferol, (DRISDOL) 1.25 MG (50000 UNIT) CAPS capsule, Take 1 capsule (50,000 Units total) by mouth every 7 (seven) days  for 12 doses., Disp: 12 capsule, Rfl: 1  Review of Systems:  Constitutional: Denies fever, chills, diaphoresis, appetite change and fatigue.  HEENT: Denies photophobia, eye pain, redness, hearing loss, ear pain, congestion, sore throat, rhinorrhea, sneezing, mouth sores, neck stiffness and tinnitus.   Respiratory: Denies SOB, DOE, cough, chest tightness,  and wheezing.   Cardiovascular: Denies chest pain, palpitations and leg swelling.  Gastrointestinal: Denies nausea, vomiting, abdominal pain, diarrhea, constipation, blood in stool and abdominal distention.  Genitourinary: Denies dysuria, urgency, frequency, hematuria, flank pain and difficulty urinating.  Endocrine: Denies: hot or cold intolerance, sweats, changes in hair or nails,  polyuria, polydipsia. Musculoskeletal: Denies myalgias, back pain, joint swelling, arthralgias and gait problem.  Skin: Denies pallor, rash and wound.  Neurological: Denies dizziness, seizures, syncope, weakness, light-headedness, numbness and headaches.  Hematological: Denies adenopathy. Easy bruising, personal or family bleeding history  Psychiatric/Behavioral: Denies suicidal ideation, mood changes, confusion, nervousness, sleep disturbance and agitation    Physical Exam: Vitals:   06/18/19 0801  BP: 104/70  Pulse: (!) 59  Temp: (!) 97.4 F (36.3 C)  TempSrc: Temporal  SpO2: 97%  Weight: 186 lb 3.2 oz (84.5 kg)    Body mass index is 34.06 kg/m.   Constitutional: NAD, calm, comfortable Eyes: PERRL, lids and conjunctivae normal, wears corrective lenses ENMT: Mucous membranes are moist.Tympanic membrane is pearly white, no erythema or bulging. Neck: normal, supple, no masses, no thyromegaly, no lymphadenopathy Respiratory: clear to auscultation bilaterally, no wheezing, no crackles. Normal respiratory effort. No accessory muscle use.  Cardiovascular: Regular rate and rhythm, no murmurs / rubs / gallops. No extremity edema.  Neurologic: Grossly intact and nonfocal Psychiatric: Normal judgment and insight. Alert and oriented x 3. Normal mood.    Impression and Plan:  Neck swelling  Choking, subsequent encounter  Hypothyroidism, unspecified type   -No thyromegaly/neck lymphadenopathy identified on exam today. -I feel that with her prior history of hypothyroidism, not being on medication for years, last documented TSH being high, this warrants further evaluation with thyroid function tests. -TSH, free T3, free T4 to be ordered today, will follow up on results. -She has also been advised to take a daily antihistamine to make sure postnasal drip is not causing some of her symptoms.    Patient Instructions  -Nice seeing you today!!  -Lab work today; will notify you once  results are available.       Lelon Frohlich, MD [facility] at [facility]

## 2019-07-08 ENCOUNTER — Other Ambulatory Visit: Payer: Self-pay

## 2019-07-30 ENCOUNTER — Other Ambulatory Visit (INDEPENDENT_AMBULATORY_CARE_PROVIDER_SITE_OTHER): Payer: Medicare Other

## 2019-07-30 ENCOUNTER — Other Ambulatory Visit: Payer: Self-pay

## 2019-07-30 DIAGNOSIS — E039 Hypothyroidism, unspecified: Secondary | ICD-10-CM | POA: Diagnosis not present

## 2019-07-30 DIAGNOSIS — E559 Vitamin D deficiency, unspecified: Secondary | ICD-10-CM | POA: Diagnosis not present

## 2019-07-30 LAB — TSH: TSH: 3.8 u[IU]/mL (ref 0.35–4.50)

## 2019-07-30 LAB — VITAMIN D 25 HYDROXY (VIT D DEFICIENCY, FRACTURES): VITD: 48.16 ng/mL (ref 30.00–100.00)

## 2019-11-26 ENCOUNTER — Other Ambulatory Visit: Payer: Self-pay

## 2019-11-26 ENCOUNTER — Ambulatory Visit (INDEPENDENT_AMBULATORY_CARE_PROVIDER_SITE_OTHER): Payer: Medicare Other | Admitting: *Deleted

## 2019-11-26 DIAGNOSIS — Z23 Encounter for immunization: Secondary | ICD-10-CM

## 2019-11-26 NOTE — Progress Notes (Signed)
imm

## 2019-12-02 ENCOUNTER — Other Ambulatory Visit: Payer: Self-pay | Admitting: Internal Medicine

## 2019-12-02 DIAGNOSIS — E559 Vitamin D deficiency, unspecified: Secondary | ICD-10-CM

## 2019-12-12 ENCOUNTER — Other Ambulatory Visit: Payer: Self-pay | Admitting: Internal Medicine

## 2019-12-12 DIAGNOSIS — E039 Hypothyroidism, unspecified: Secondary | ICD-10-CM

## 2020-01-06 ENCOUNTER — Other Ambulatory Visit: Payer: Self-pay

## 2020-01-06 ENCOUNTER — Encounter: Payer: Self-pay | Admitting: Internal Medicine

## 2020-01-06 ENCOUNTER — Ambulatory Visit (INDEPENDENT_AMBULATORY_CARE_PROVIDER_SITE_OTHER): Payer: Medicare Other | Admitting: Internal Medicine

## 2020-01-06 VITALS — BP 110/78 | HR 65 | Temp 98.0°F | Wt 180.3 lb

## 2020-01-06 DIAGNOSIS — K219 Gastro-esophageal reflux disease without esophagitis: Secondary | ICD-10-CM | POA: Diagnosis not present

## 2020-01-06 DIAGNOSIS — F411 Generalized anxiety disorder: Secondary | ICD-10-CM

## 2020-01-06 DIAGNOSIS — E039 Hypothyroidism, unspecified: Secondary | ICD-10-CM

## 2020-01-06 DIAGNOSIS — E559 Vitamin D deficiency, unspecified: Secondary | ICD-10-CM

## 2020-01-06 MED ORDER — OMEPRAZOLE 10 MG PO CPDR
10.0000 mg | DELAYED_RELEASE_CAPSULE | Freq: Every day | ORAL | 0 refills | Status: DC
Start: 2020-01-06 — End: 2020-01-20

## 2020-01-06 MED ORDER — PANTOPRAZOLE SODIUM 40 MG PO TBEC: 40.0000 mg | DELAYED_RELEASE_TABLET | Freq: Every day | ORAL | 1 refills | Status: DC

## 2020-01-06 MED ORDER — CLONAZEPAM 0.5 MG PO TABS: 0.5000 mg | ORAL_TABLET | Freq: Two times a day (BID) | ORAL | 1 refills | Status: DC | PRN

## 2020-01-06 MED ORDER — LEVOTHYROXINE SODIUM 25 MCG PO TABS: ORAL_TABLET | ORAL | 1 refills | Status: DC

## 2020-01-06 NOTE — Patient Instructions (Signed)
-  Nice seeing you today!!  -Lab work today; will notify you once results are available.  -Start protonix 40 mg daily.  -Schedule follow up in 3 months.

## 2020-01-06 NOTE — Progress Notes (Signed)
Established Patient Office Visit     This visit occurred during the SARS-CoV-2 public health emergency.  Safety protocols were in place, including screening questions prior to the visit, additional usage of staff PPE, and extensive cleaning of exam room while observing appropriate contact time as indicated for disinfecting solutions.    CC/Reason for Visit: Follow-up chronic conditions, discuss continued neck issues.  HPI: Jaime Fuller is a 69 y.o. female who is coming in today for the above mentioned reasons. Past Medical History is significant for: Pernicious anemia, generalized anxiety disorder on Klonopin and chronic back pain on tramadol.  She was recently diagnosed with hypothyroidism and was started on 25 mcg of levothyroxine daily.  Follow-up TSH was within range.  She continues to have issues with a "choking sensation in her throat".  She feels like she has a knot in her neck.  Is definitely worse with eating, she feels like it gets better after she walks, her ears sometimes hurt a little bit she feels a little short of breath when this happens.  She has a hoarse voice all the time, she does not have a sour taste in her mouth although she feels like pain improves with belching.  Around the summertime she started taking over-the-counter Prilosec and Zantac and noticed some improvement, this pain has worsened since stopping.   Past Medical/Surgical History: Past Medical History:  Diagnosis Date  . Anxiety state, unspecified 09/20/2007  . Arthritis   . Cataract   . Chronic kidney disease    kidney stones  . HYPOTHYROIDISM 03/08/2007   no meds needed now 04-15-19  . Pancreatitis    secondary to ERCP    Past Surgical History:  Procedure Laterality Date  . abdominal plasty    . Carpel tunnel surgery left hand    . Childbirth x 2    . CHOLECYSTECTOMY    . GASTRIC BYPASS    . UPPER GASTROINTESTINAL ENDOSCOPY    . WISDOM TOOTH EXTRACTION      Social History:  reports  that she has quit smoking. She has never used smokeless tobacco. She reports that she does not drink alcohol and does not use drugs.  Allergies: Allergies  Allergen Reactions  . Vicodin [Hydrocodone-Acetaminophen] Itching  . Bee Venom     anaphylactic    Family History:  Family History  Problem Relation Age of Onset  . Lung cancer Father   . Heart attack Father        Died at 68  . Colon cancer Neg Hx   . Esophageal cancer Neg Hx   . Rectal cancer Neg Hx   . Stomach cancer Neg Hx      Current Outpatient Medications:  .  celecoxib (CELEBREX) 50 MG capsule, Take 50 mg by mouth 2 (two) times daily., Disp: , Rfl:  .  clonazePAM (KLONOPIN) 0.5 MG tablet, Take 1 tablet (0.5 mg total) by mouth 2 (two) times daily as needed for anxiety., Disp: 30 tablet, Rfl: 1 .  Cyanocobalamin (VITAMIN B12) 1000 MCG TBCR, Take 1,000 mcg at bedtime by mouth., Disp: , Rfl:  .  EPINEPHrine 0.3 mg/0.3 mL IJ SOAJ injection, Inject 0.3 mLs (0.3 mg total) into the muscle as needed for anaphylaxis., Disp: 1 each, Rfl: 1 .  ferrous sulfate 325 (65 FE) MG EC tablet, Take 1 tablet (325 mg total) by mouth 2 (two) times daily with breakfast and lunch., Disp: , Rfl: 3 .  levothyroxine (SYNTHROID) 25 MCG tablet, TAKE 1  TABLET BY MOUTH DAILY BEFORE BREAKFAST., Disp: 90 tablet, Rfl: 1 .  omeprazole (PRILOSEC) 10 MG capsule, Take 10 mg by mouth daily., Disp: , Rfl:  .  valACYclovir (VALTREX) 1000 MG tablet, ONE TABLET TWICE A DAY X 5 DAYS AS NEEDED FOR OUTBREAK, Disp: 50 tablet, Rfl: 1 .  pantoprazole (PROTONIX) 40 MG tablet, Take 1 tablet (40 mg total) by mouth daily., Disp: 90 tablet, Rfl: 1  Review of Systems:  Constitutional: Denies fever, chills, diaphoresis, appetite change and fatigue.  HEENT: Denies photophobia, eye pain, redness, hearing loss, ear pain, congestion, sore throat, rhinorrhea, sneezing, mouth sores, trouble swallowing, neck pain, neck stiffness and tinnitus.   Respiratory: Denies SOB, DOE, cough,  chest tightness,  and wheezing.   Cardiovascular: Denies chest pain, palpitations and leg swelling.  Gastrointestinal: Denies nausea, vomiting, abdominal pain, diarrhea, constipation, blood in stool and abdominal distention.  Genitourinary: Denies dysuria, urgency, frequency, hematuria, flank pain and difficulty urinating.  Endocrine: Denies: hot or cold intolerance, sweats, changes in hair or nails, polyuria, polydipsia. Musculoskeletal: Denies myalgias, back pain, joint swelling, arthralgias and gait problem.  Skin: Denies pallor, rash and wound.  Neurological: Denies dizziness, seizures, syncope, weakness, light-headedness, numbness and headaches.  Hematological: Denies adenopathy. Easy bruising, personal or family bleeding history  Psychiatric/Behavioral: Denies suicidal ideation, mood changes, confusion, nervousness, sleep disturbance and agitation    Physical Exam: Vitals:   01/06/20 0735  BP: 110/78  Pulse: 65  Temp: 98 F (36.7 C)  TempSrc: Oral  SpO2: 96%  Weight: 180 lb 4.8 oz (81.8 kg)    Body mass index is 32.98 kg/m.   Constitutional: NAD, calm, comfortable Eyes: PERRL, lids and conjunctivae normal, wears corrective lenses ENMT: Mucous membranes are moist.  Tympanic membrane is pearly white, no erythema or bulging. Neck: normal, supple, no masses, no thyromegaly, no lymphadenopathy Respiratory: clear to auscultation bilaterally, no wheezing, no crackles. Normal respiratory effort. No accessory muscle use.  Cardiovascular: Regular rate and rhythm, no murmurs / rubs / gallops. No extremity edema. Abdomen: no tenderness, no masses palpated. No hepatosplenomegaly. Bowel sounds positive.  Neurologic: Grossly intact and nonfocal Psychiatric: Normal judgment and insight. Alert and oriented x 3. Normal mood.    Impression and Plan:  Hypothyroidism, unspecified type  -Check TSH, continue current levothyroxine dose which is 25 mcg.  GAD (generalized anxiety disorder)   - Plan: clonazePAM (KLONOPIN) 0.5 MG tablet -Well-controlled.  Vitamin D deficiency -She completed high-dose vitamin D supplementation and is now taking over-the-counter 2000 IUs daily.  Gastroesophageal reflux disease, unspecified whether esophagitis present  -This is most likely the etiology of her neck issues. -We will do a trial of daily Protonix.  If continues to have issues after 8 weeks, can consider GI referral for consideration of EGD, does not sound like dysphagia but more like acid reflux   Patient Instructions  -Nice seeing you today!!  -Lab work today; will notify you once results are available.  -Start protonix 40 mg daily.  -Schedule follow up in 3 months.     Lelon Frohlich, MD [facility] at [facility]

## 2020-01-06 NOTE — Addendum Note (Signed)
Addended by: Westley Hummer B on: 01/06/2020 04:21 PM   Modules accepted: Orders

## 2020-01-07 LAB — TSH: TSH: 5.7 mIU/L — ABNORMAL HIGH (ref 0.40–4.50)

## 2020-01-08 ENCOUNTER — Other Ambulatory Visit: Payer: Self-pay | Admitting: Internal Medicine

## 2020-01-08 DIAGNOSIS — E039 Hypothyroidism, unspecified: Secondary | ICD-10-CM

## 2020-01-08 MED ORDER — LEVOTHYROXINE SODIUM 50 MCG PO TABS: 50.0000 ug | ORAL_TABLET | Freq: Every day | ORAL | 3 refills | Status: DC

## 2020-01-14 ENCOUNTER — Telehealth: Payer: Self-pay | Admitting: *Deleted

## 2020-01-14 ENCOUNTER — Other Ambulatory Visit: Payer: Self-pay | Admitting: Internal Medicine

## 2020-01-14 MED ORDER — CELECOXIB 50 MG PO CAPS: 50.0000 mg | ORAL_CAPSULE | Freq: Every day | ORAL | 1 refills | Status: DC | PRN

## 2020-01-14 NOTE — Telephone Encounter (Signed)
What Rx is she requesting?

## 2020-01-14 NOTE — Telephone Encounter (Signed)
She would like a refill of Celebrex

## 2020-01-14 NOTE — Telephone Encounter (Signed)
Patient called wanting to know if Dr Jerilee Hoh was going to [facility]. Please advise

## 2020-01-14 NOTE — Addendum Note (Signed)
Addended by: Erline Hau on: 01/14/2020 02:37 PM   Modules accepted: Orders

## 2020-01-14 NOTE — Telephone Encounter (Signed)
Sent!

## 2020-01-14 NOTE — Addendum Note (Signed)
Addended by: Westley Hummer B on: 01/14/2020 01:29 PM   Modules accepted: Orders

## 2020-01-19 ENCOUNTER — Telehealth: Payer: Self-pay

## 2020-01-19 DIAGNOSIS — M549 Dorsalgia, unspecified: Secondary | ICD-10-CM

## 2020-01-19 DIAGNOSIS — R221 Localized swelling, mass and lump, neck: Secondary | ICD-10-CM

## 2020-01-19 NOTE — Telephone Encounter (Signed)
patient is asking nurse to give her a call back about her medications she is confused as to why she was prescribed 2 medications for the same thing an which one she needs to take or stop taking     Please call and advise

## 2020-01-20 NOTE — Telephone Encounter (Signed)
Referral placed.

## 2020-01-20 NOTE — Telephone Encounter (Signed)
Spoke with patient and Prilosec has been removed from her medication list.  She has also stopped taking Celebrex due to side effects like GERD and cough.  Her symptoms have improved.  Medication list updated.   She has gone to [facility] in the past for PT.  She would like to go back for PT instead of taking more medication.  Okay to refer?

## 2020-01-20 NOTE — Telephone Encounter (Signed)
[facility] for PT referral

## 2020-02-13 ENCOUNTER — Telehealth: Payer: Self-pay | Admitting: Internal Medicine

## 2020-02-13 DIAGNOSIS — K219 Gastro-esophageal reflux disease without esophagitis: Secondary | ICD-10-CM

## 2020-02-13 NOTE — Telephone Encounter (Signed)
Ok for GI referral?  

## 2020-02-13 NOTE — Telephone Encounter (Signed)
The patient has been taking pantoprazole (PROTONIX) 40 MG tablet for 2 months now and she is still not seeing any improvement. She wants to know if Dr. Jerilee Hoh needs to refer her to a GI specialist.  Please advise

## 2020-02-13 NOTE — Telephone Encounter (Signed)
Referral placed.

## 2020-02-13 NOTE — Addendum Note (Signed)
Addended by: Westley Hummer B on: 02/13/2020 02:21 PM   Modules accepted: Orders

## 2020-02-16 ENCOUNTER — Other Ambulatory Visit (INDEPENDENT_AMBULATORY_CARE_PROVIDER_SITE_OTHER): Payer: Medicare Other

## 2020-02-16 ENCOUNTER — Other Ambulatory Visit: Payer: Self-pay

## 2020-02-16 DIAGNOSIS — E039 Hypothyroidism, unspecified: Secondary | ICD-10-CM

## 2020-02-17 LAB — TSH: TSH: 3.64 mIU/L (ref 0.40–4.50)

## 2020-02-23 ENCOUNTER — Ambulatory Visit (INDEPENDENT_AMBULATORY_CARE_PROVIDER_SITE_OTHER): Payer: Medicare Other | Admitting: Gastroenterology

## 2020-02-23 ENCOUNTER — Encounter: Payer: Self-pay | Admitting: Gastroenterology

## 2020-02-23 VITALS — BP 130/64 | HR 79 | Ht 62.0 in | Wt 179.0 lb

## 2020-02-23 DIAGNOSIS — R198 Other specified symptoms and signs involving the digestive system and abdomen: Secondary | ICD-10-CM

## 2020-02-23 DIAGNOSIS — R0989 Other specified symptoms and signs involving the circulatory and respiratory systems: Secondary | ICD-10-CM

## 2020-02-23 NOTE — Progress Notes (Signed)
Referring Provider: Isaac Bliss, [facility]* Primary Care Physician:  Isaac Bliss, Rayford Halsted, MD  Reason for Consultation:  GERD   IMPRESSION:  Globus ? GERD not responding to PPI History of colon polyps    - 4 tubular adenomas removed 05/23/19    - surveillance recommended 2024  Gastric bypass 2004 Cholecystectomy for symptomatic cholelithiasis History of post ERCP pancreatitis 1996  Globus ? GERD not responding to PPI:  have recommended an EGD with esophageal and gastric biopsies given the differential of reflux esophagitis, persistent H pylori, PUD, gastritis, and non-erosive reflux disease. Must also screen for complications of reflux including esophagitis and Barrett's esophagus.   PLAN: I did not make any medication changes today EGD with esophageal biopsies  Please see the "Patient Instructions" section for addition details about the plan.  I spent 30 minutes, including in depth chart review, face-to-face time with the patient, coordinating care, and ordering studies and medications as appropriate, and documentation.  HPI: Jaime Fuller is a 69 y.o. female referred by Dr. Isaac Bliss for evaluation of possible reflux. This is my first office visit with the patient, who I met at the time of her direct access colonoscopy 05/23/2019. Colonoscopy revealed left-sided diverticulosis and four tubular adenomas.  Surveillance colonoscopy recommended in 3 years. She has a history of distant cholecystectomy for symptomatic cholelithiasis, post-ERCP pancreatitis 1996, gastric bypass 2004, and evaluated by Dr. Olevia Perches in 2006 for anemia.  She also has generalized anxiety disorder, chronic back pain, hypothyroidism, and a history of pernicious anemia.  Symptoms date back to time of colonoscopy 05/2019. Had the Covid vaccine soon thereafter with resulting palpable lymph nodes. Lymph notes became smaller, but she was left with an ongoing sensation of globus in her neck/throat.   Associated dysphonia, frequent coughing. Sore when she touches her sternal notch with the sensation that she is choking. Worsened by eating and lying supine, improved with walking and eructation.  Occasional associated neck pain that radiates to her ears and eyes, dysphonia. No heartburn, regurgitation, brash. No anemia, dysphagia, early satiety, hematemesis, hematochezia, melena, odynophagia, or weight loss. Noted temporary improvement after steroid injection.  Self treated with OTC Prilosec and Zantac without significant improvement.  Not responding to 2 months of pantoprazole 40 mg as prescribed by Dr. Isaac Bliss. No change with removing carbonated beverages from the diet or discontinuing Celebrex. Drinks a lot of caffeeine - previously Dr Malachi Bonds and [facility] No other NSAIDs.    Prior endoscopic history: Screening colonoscopy with Dr. Olevia Perches 05/05/2004: normal EGD with Dr. Olevia Perches 10/18/2004 to evaluate anemia, and chest pain:GE junction at 35 cm, gastric bypass anastomosis present, otherwise normal.  Biopsies of the small bowel were normal. Colonoscopy 05/23/19: left sided diverticulosis, 4 tubular adenomas   Past Medical History:  Diagnosis Date  . Anxiety state, unspecified 09/20/2007  . Arthritis   . Cataract   . Chronic kidney disease    kidney stones  . HYPOTHYROIDISM 03/08/2007   no meds needed now 04-15-19  . Pancreatitis    secondary to ERCP    Past Surgical History:  Procedure Laterality Date  . abdominal plasty    . Carpel tunnel surgery left hand    . Childbirth x 2    . CHOLECYSTECTOMY    . GASTRIC BYPASS    . UPPER GASTROINTESTINAL ENDOSCOPY    . WISDOM TOOTH EXTRACTION      Current Outpatient Medications  Medication Sig Dispense Refill  . clonazePAM (KLONOPIN) 0.5 MG tablet Take 1 tablet (  0.5 mg total) by mouth 2 (two) times daily as needed for anxiety. 30 tablet 1  . Cyanocobalamin (VITAMIN B12) 1000 MCG TBCR Take 1,000 mcg at bedtime by mouth.    .  EPINEPHrine 0.3 mg/0.3 mL IJ SOAJ injection Inject 0.3 mLs (0.3 mg total) into the muscle as needed for anaphylaxis. 1 each 1  . ferrous sulfate 325 (65 FE) MG EC tablet Take 1 tablet (325 mg total) by mouth 2 (two) times daily with breakfast and lunch.  3  . levothyroxine (SYNTHROID) 50 MCG tablet Take 1 tablet (50 mcg total) by mouth daily. 90 tablet 3  . pantoprazole (PROTONIX) 40 MG tablet Take 1 tablet (40 mg total) by mouth daily. 90 tablet 1  . valACYclovir (VALTREX) 1000 MG tablet ONE TABLET TWICE A DAY X 5 DAYS AS NEEDED FOR OUTBREAK 50 tablet 1   No current facility-administered medications for this visit.    Allergies as of 02/23/2020 - Review Complete 01/06/2020  Allergen Reaction Noted  . Vicodin [hydrocodone-acetaminophen] Itching 07/07/2010  . Bee venom  04/15/2019    Family History  Problem Relation Age of Onset  . Lung cancer Father   . Heart attack Father        Died at 41  . Colon cancer Neg Hx   . Esophageal cancer Neg Hx   . Rectal cancer Neg Hx   . Stomach cancer Neg Hx     Social History   Socioeconomic History  . Marital status: Married    Spouse name: Not on file  . Number of children: Not on file  . Years of education: Not on file  . Highest education level: Not on file  Occupational History  . Not on file  Tobacco Use  . Smoking status: Former Research scientist (life sciences)  . Smokeless tobacco: Never Used  Vaping Use  . Vaping Use: Never used  Substance and Sexual Activity  . Alcohol use: No    Alcohol/week: 0.0 standard drinks  . Drug use: No  . Sexual activity: Not on file  Other Topics Concern  . Not on file  Social History Narrative  . Not on file   Social Determinants of Health   Financial Resource Strain: Not on file  Food Insecurity: Not on file  Transportation Needs: Not on file  Physical Activity: Not on file  Stress: Not on file  Social Connections: Not on file  Intimate Partner Violence: Not on file     Physical Exam: General:   Alert,   well-nourished, pleasant and cooperative in NAD Head:  Normocephalic and atraumatic. Eyes:  Sclera clear, no icterus.   Conjunctiva pink. Ears:  Normal auditory acuity. Nose:  No deformity, discharge,  or lesions. Mouth:  No deformity or lesions.   Neck:  Supple; no masses or thyromegaly. Lungs:  Clear throughout to auscultation.   No wheezes. Heart:  Regular rate and rhythm; no murmurs. Abdomen:  Soft ,nontender, nondistended, normal bowel sounds, no rebound or guarding. No hepatosplenomegaly.   Rectal:  Deferred  Msk:  Symmetrical. No boney deformities LAD: No inguinal or umbilical LAD Extremities:  No clubbing or edema. Neurologic:  Alert and  oriented x4;  grossly nonfocal Skin:  Intact without significant lesions or rashes. Psych:  Alert and cooperative. Normal mood and affect.    Camron Monday L. Tarri Glenn, MD, MPH 02/23/2020, 1:01 PM

## 2020-02-23 NOTE — Patient Instructions (Signed)
ENDOSCOPY: You have been scheduled for a endoscopy. Please follow written instructions given to you at your visit today.   INHALERS: If you use inhalers (even only as needed), please bring them with you on the day of your procedure.  If you are age 68 or older, your body mass index should be between 23-30. Your Body mass index is 32.74 kg/m. If this is out of the aforementioned range listed, please consider follow up with your Primary Care Provider.  Thank you for trusting me with your gastrointestinal care!    Thornton Park, MD, MPH

## 2020-02-25 ENCOUNTER — Encounter: Payer: Self-pay | Admitting: Gastroenterology

## 2020-02-25 ENCOUNTER — Ambulatory Visit (AMBULATORY_SURGERY_CENTER): Payer: Medicare Other | Admitting: Gastroenterology

## 2020-02-25 ENCOUNTER — Other Ambulatory Visit: Payer: Self-pay

## 2020-02-25 VITALS — BP 131/68 | HR 57 | Temp 97.5°F | Resp 13 | Ht 62.0 in | Wt 179.0 lb

## 2020-02-25 DIAGNOSIS — K317 Polyp of stomach and duodenum: Secondary | ICD-10-CM

## 2020-02-25 DIAGNOSIS — K2970 Gastritis, unspecified, without bleeding: Secondary | ICD-10-CM

## 2020-02-25 DIAGNOSIS — R198 Other specified symptoms and signs involving the digestive system and abdomen: Secondary | ICD-10-CM

## 2020-02-25 DIAGNOSIS — R0989 Other specified symptoms and signs involving the circulatory and respiratory systems: Secondary | ICD-10-CM

## 2020-02-25 DIAGNOSIS — K219 Gastro-esophageal reflux disease without esophagitis: Secondary | ICD-10-CM | POA: Diagnosis present

## 2020-02-25 DIAGNOSIS — K208 Other esophagitis without bleeding: Secondary | ICD-10-CM | POA: Diagnosis not present

## 2020-02-25 MED ORDER — SODIUM CHLORIDE 0.9 % IV SOLN: 500.0000 mL | INTRAVENOUS | Status: DC

## 2020-02-25 NOTE — Progress Notes (Signed)
VS done by CW 

## 2020-02-25 NOTE — Progress Notes (Signed)
pt tolerated well. VSS. awake and to recovery. Report given to RN. Bite block inserted and removed without trauma. 

## 2020-02-25 NOTE — Op Note (Signed)
[facility] Patient Name: Jaime Fuller Procedure Date: 02/25/2020 1:36 PM MRN: [id number] Endoscopist: Thornton Park MD, MD Age: 69 Referring MD:  Date of Birth: May 15, 1950 Gender: Female Account #: [id number] Procedure:                Upper GI endoscopy Indications:              Globus sensation? GERD not responding to PPI                           Gastric bypass 2004 Medicines:                Monitored Anesthesia Care Procedure:                Pre-Anesthesia Assessment:                           - Prior to the procedure, a History and Physical                            was performed, and patient medications and                            allergies were reviewed. The patient's tolerance of                            previous anesthesia was also reviewed. The risks                            and benefits of the procedure and the sedation                            options and risks were discussed with the patient.                            All questions were answered, and informed consent                            was obtained. Prior Anticoagulants: The patient has                            taken no previous anticoagulant or antiplatelet                            agents. ASA Grade Assessment: II - A patient with                            mild systemic disease. After reviewing the risks                            and benefits, the patient was deemed in                            satisfactory condition to undergo the procedure.  After obtaining informed consent, the endoscope was                            passed under direct vision. Throughout the                            procedure, the patient's blood pressure, pulse, and                            oxygen saturations were monitored continuously. The                            Endoscope was introduced through the mouth, and                            advanced to the jejunum. The upper GI  endoscopy was                            accomplished without difficulty. The patient                            tolerated the procedure well. Scope In: Scope Out: Findings:                 The examined esophagus was normal. The z-line is 35                            cm from the incisors. Biopsies were obtained from                            the proximal and distal esophagus with cold forceps                            for histology. No ring, web, or stricture.                           Evidence of a gastric bypass was found. A gastric                            pouch with a normal size was found. The pouch                            mucosa appears normal. The gastrojejunal                            anastomosis was characterized by healthy appearing                            mucosa. This was traversed. The pouch-to-jejunum                            limb was characterized by healthy appearing mucosa.  The jejunojejunal anastomosis was characterized by                            healthy appearing mucosa. The duodenum-to-jejunum                            limb was not examined as it could not be found.                           A single 5 mm sessile polyp was found in the                            gastric body. The polyp was removed with a cold                            biopsy forceps. Resection and retrieval were                            complete. Estimated blood loss was minimal.                           The examined jejunum was normal. Complications:            No immediate complications. Estimated blood loss:                            Minimal. Estimated Blood Loss:     Estimated blood loss was minimal. Impression:               - Normal esophagus. Biopsied.                           - Gastric bypass with a normal-sized pouch.                            Gastrojejunal anastomosis characterized by healthy                            appearing mucosa.                            - A single gastric polyp. Resected and retrieved.                           - Normal examined jejunum. Recommendation:           - Patient has a contact number available for                            emergencies. The signs and symptoms of potential                            delayed complications were discussed with the                            patient. Return to normal activities tomorrow.  Written discharge instructions were provided to the                            patient.                           - Resume previous diet.                           - Continue present medications.                           - Await pathology results. Thornton Park MD, MD 02/25/2020 2:04:03 PM This report has been signed electronically.

## 2020-02-25 NOTE — Progress Notes (Signed)
Called to room to assist during endoscopic procedure.  Patient ID and intended procedure confirmed with present staff. Received instructions for my participation in the procedure from the performing physician.  

## 2020-02-25 NOTE — Patient Instructions (Signed)
YOU HAD AN ENDOSCOPIC PROCEDURE TODAY AT THE [facility]:   Refer to the procedure report that was given to you for any specific questions about what was found during the examination.  If the procedure report does not answer your questions, please call your gastroenterologist to clarify.  If you requested that your care partner not be given the details of your procedure findings, then the procedure report has been included in a sealed envelope for you to review at your convenience later.  YOU SHOULD EXPECT: Some feelings of bloating in the abdomen. Passage of more gas than usual.  Walking can help get rid of the air that was put into your GI tract during the procedure and reduce the bloating. If you had a lower endoscopy (such as a colonoscopy or flexible sigmoidoscopy) you may notice spotting of blood in your stool or on the toilet paper. If you underwent a bowel prep for your procedure, you may not have a normal bowel movement for a few days.  Please Note:  You might notice some irritation and congestion in your nose or some drainage.  This is from the oxygen used during your procedure.  There is no need for concern and it should clear up in a day or so.  SYMPTOMS TO REPORT IMMEDIATELY:    Following upper endoscopy (EGD)  Vomiting of blood or coffee ground material  New chest pain or pain under the shoulder blades  Painful or persistently difficult swallowing  New shortness of breath  Fever of 100F or higher  Black, tarry-looking stools  For urgent or emergent issues, a gastroenterologist can be reached at any hour by calling [phone]. Do not use MyChart messaging for urgent concerns.    DIET:  We do recommend a small meal at first, but then you may proceed to your regular diet.  Drink plenty of fluids but you should avoid alcoholic beverages for 24 hours.  ACTIVITY:  You should plan to take it easy for the rest of today and you should NOT DRIVE or use heavy machinery  until tomorrow (because of the sedation medicines used during the test).    FOLLOW UP: Our staff will call the number listed on your records 48-72 hours following your procedure to check on you and address any questions or concerns that you may have regarding the information given to you following your procedure. If we do not reach you, we will leave a message.  We will attempt to reach you two times.  During this call, we will ask if you have developed any symptoms of COVID 19. If you develop any symptoms (ie: fever, flu-like symptoms, shortness of breath, cough etc.) before then, please call [phone].  If you test positive for Covid 19 in the 2 weeks post procedure, please call and report this information to us.    If any biopsies were taken you will be contacted by phone or by letter within the next 1-3 weeks.  Please call us at [phone] if you have not heard about the biopsies in 3 weeks.    SIGNATURES/CONFIDENTIALITY: You and/or your care partner have signed paperwork which will be entered into your electronic medical record.  These signatures attest to the fact that that the information above on your After Visit Summary has been reviewed and is understood.  Full responsibility of the confidentiality of this discharge information lies with you and/or your care-partner. 

## 2020-02-27 ENCOUNTER — Telehealth: Payer: Self-pay

## 2020-02-27 NOTE — Telephone Encounter (Signed)
  Follow up Call-  Call back number 02/25/2020 05/23/2019  Post procedure Call Back phone  # [phone]  Permission to leave phone message Yes Yes  Some recent data might be hidden     Patient questions:  Do you have a fever, pain , or abdominal swelling? No. Pain Score  0 *  Have you tolerated food without any problems? Yes.    Have you been able to return to your normal activities? Yes.    Do you have any questions about your discharge instructions: Diet   No. Medications  No. Follow up visit  No.  Do you have questions or concerns about your Care? No.  Actions: * If pain score is 4 or above: No action needed, pain <4. 1. Have you developed a fever since your procedure? no  2.   Have you had an respiratory symptoms (SOB or cough) since your procedure? no  3.   Have you tested positive for COVID 19 since your procedure no  4.   Have you had any family members/close contacts diagnosed with the COVID 19 since your procedure?  no   If yes to any of these questions please route to Joylene John, RN and Joella Prince, RN

## 2020-03-04 ENCOUNTER — Other Ambulatory Visit: Payer: Self-pay

## 2020-03-04 ENCOUNTER — Telehealth: Payer: Self-pay | Admitting: Gastroenterology

## 2020-03-04 MED ORDER — FAMOTIDINE 20 MG PO TABS: 20.0000 mg | ORAL_TABLET | Freq: Two times a day (BID) | ORAL | 3 refills | Status: DC

## 2020-03-04 MED ORDER — PANTOPRAZOLE SODIUM 40 MG PO TBEC: 40.0000 mg | DELAYED_RELEASE_TABLET | Freq: Two times a day (BID) | ORAL | 3 refills | Status: DC

## 2020-03-04 NOTE — Telephone Encounter (Signed)
I reviewed the pathology results from her recent EGD. They show changes of reflux, but, are otherwise normal except for the benign hyperplastic gastric polyp.  Given these results, I recommend that she continue the pantoprazole 40 mg BID and add famotidine 20 mg BID. Please schedule follow-up appointment with me or APP in January to monitor her response to therapy. Thanks.

## 2020-03-04 NOTE — Telephone Encounter (Signed)
Pt is requesting a call back from a nurse to discuss her throat pain, pt just had an EGD but pt is not having any relief.

## 2020-03-04 NOTE — Telephone Encounter (Signed)
Spoke with pt and she is aware. New scripts sent to pharmacy for pt. Pt scheduled to see Ellouise Newer PA 03/24/20 at 10am. Pt aware

## 2020-03-04 NOTE — Telephone Encounter (Signed)
Pt states her throat is still sore. Reports the protonix is not helping at all. She states her throat and mouth hurt when she eats or drinks anything. Please advise.

## 2020-03-11 ENCOUNTER — Telehealth: Payer: Self-pay | Admitting: Gastroenterology

## 2020-03-11 NOTE — Telephone Encounter (Signed)
Line rings busy  

## 2020-03-11 NOTE — Telephone Encounter (Signed)
Patient states she has been getting a lot of Gerd symptoms and is to the point that she can not eat anything and the medications are not helping.

## 2020-03-15 NOTE — Telephone Encounter (Signed)
Left message for pt to call back  °

## 2020-03-15 NOTE — Telephone Encounter (Signed)
Pt states she is doing better some now on the bid protonix dosing. She is really watching what she eats and eating 6 small meals/day instead of 3 large meals. Discussed with pt that it may take more than a week at the increased dose and to call the office back if she does not continue to improve. She knows to keep her OV as scheduled.

## 2020-03-16 DIAGNOSIS — M4726 Other spondylosis with radiculopathy, lumbar region: Secondary | ICD-10-CM | POA: Diagnosis not present

## 2020-03-16 DIAGNOSIS — R221 Localized swelling, mass and lump, neck: Secondary | ICD-10-CM | POA: Diagnosis not present

## 2020-03-16 DIAGNOSIS — M549 Dorsalgia, unspecified: Secondary | ICD-10-CM | POA: Diagnosis not present

## 2020-03-24 ENCOUNTER — Encounter: Payer: Self-pay | Admitting: Physician Assistant

## 2020-03-24 ENCOUNTER — Telehealth: Payer: Self-pay | Admitting: Internal Medicine

## 2020-03-24 ENCOUNTER — Ambulatory Visit (INDEPENDENT_AMBULATORY_CARE_PROVIDER_SITE_OTHER): Payer: Medicare Other | Admitting: Physician Assistant

## 2020-03-24 VITALS — BP 130/70 | HR 64 | Ht 62.0 in | Wt 179.2 lb

## 2020-03-24 DIAGNOSIS — R198 Other specified symptoms and signs involving the digestive system and abdomen: Secondary | ICD-10-CM

## 2020-03-24 DIAGNOSIS — K219 Gastro-esophageal reflux disease without esophagitis: Secondary | ICD-10-CM

## 2020-03-24 DIAGNOSIS — R0989 Other specified symptoms and signs involving the circulatory and respiratory systems: Secondary | ICD-10-CM

## 2020-03-24 MED ORDER — OMEPRAZOLE 40 MG PO CPDR: 40.0000 mg | DELAYED_RELEASE_CAPSULE | Freq: Two times a day (BID) | ORAL | 3 refills | Status: DC

## 2020-03-24 NOTE — Patient Instructions (Addendum)
  If you are age 69 or older, your body mass index should be between 23-30. Your Body mass index is 32.78 kg/m. If this is out of the aforementioned range listed, please consider follow up with your Primary Care Provider.  If you are age 81 or younger, your body mass index should be between 19-25. Your Body mass index is 32.78 kg/m. If this is out of the aformentioned range listed, please consider follow up with your Primary Care Provider.   We have sent the following medications to your pharmacy for you to pick up at your convenience: Omeprazole 40 mg  Please Stop Pantoprazole   Continue Famotidine 20 mg twice daily.  Thank you for choosing me and [facility].  Ellouise Newer, PA-C

## 2020-03-24 NOTE — Telephone Encounter (Signed)
Please schedule OV to discuss 

## 2020-03-24 NOTE — Telephone Encounter (Signed)
Pt is calling in with a concern with her Rx levothyroxine (SYNTHROID) 50 MCG stating that she thinks that this is making her feel as though she has a knot in her throat.  Pt is stating that she has stopped drinking sodas, tea and coffee she is only drinking water and plenty of it.  Pt stated that she had been eating smaller portions of food.  Pt is not taking the Vit-D and iron.  Taking 2 Protonix was dropping her Bp daily and Dr. Modena Nunnery has changed it to 2 Pepcid and Prilosec twice a day.  Pt would like to have a call back to elaborate more on it.

## 2020-03-24 NOTE — Progress Notes (Signed)
Reviewed.  Porter Nakama L. Hilding Quintanar, MD, MPH  

## 2020-03-24 NOTE — Progress Notes (Signed)
Chief Complaint: Follow-up reflux  HPI:    Jaime Fuller is a 70 year old female with past medical history of CKD and others listed below, known to Dr. Tarri Glenn, who presents to clinic today for follow-up of reflux.    02/23/2020 patient seen in clinic by Dr. Tarri Glenn for reflux with a globus sensation not responding to PPI.  Was also noted she had a history of colon polyps, 4 tubular adenomas removed 05/23/2019 with surveillance recommended 2024.  Gastric bypass 2004, cholecystectomy for symptomatic cholelithiasis and history of post ERCP pancreatitis 1996.  It was recommended she have an EGD.    02/25/2020 EGD with a normal esophagus, gastric bypass with a normal-sized pouch, gastrojejunal anastomosis characterized by healthy-appearing mucosa, single gastric polyp and normal jejunum.  Pathology showed a hyperplastic polyp and reactive squamous mucosa.  It was recommended she continue the pantoprazole 40 mg twice daily and add famotidine 20 mg twice daily.    03/11/2020 patient described a lot of reflux symptoms to the point where she cannot eat anything.    03/15/2020 patient reports she is doing some better with twice daily Protonix dosing.  She was eating 6 small meals a day instead of 3 large ones which was helping some.    Today, the patient tells me that when she was taking Pantoprazole 40 mg twice a day and Famotidine 20 mg twice a day, she was having trouble with her blood pressure dropping.  She dropped the second dose of both of these medicines and went back to once daily dosing and her blood pressure slowly went back to normal.  Tells me that she still believes this all has occurred due to starting thyroid medication because "I had no problems before then".  Now continues with some reflux as well as issues with occasional nausea and a globus sensation which starts right away in the morning.  Tells me she has tried to alter her diet, apparently drink 4-6 [facility] or Dr. Samson Frederic a day and then  went to tea, but has stopped caffeinated beverages altogether in an attempt to help with her reflux but nothing seems to be working.    Denies fever, chills, dysphagia, blood in her stool or weight loss.  Prior endoscopic history: Screening colonoscopy with Dr. Olevia Perches 05/05/2004: normal EGD with Dr. Olevia Perches 10/18/2004 to evaluate anemia, and chest pain:GE junction at 35 cm, gastric bypass anastomosis present, otherwise normal.  Biopsies of the small bowel were normal. Colonoscopy 05/23/19: left sided diverticulosis, 4 tubular adenomas  Past Medical History:  Diagnosis Date  . Anxiety state, unspecified 09/20/2007  . Arthritis   . Cataract   . Chronic kidney disease    kidney stones  . HYPOTHYROIDISM 03/08/2007   no meds needed now 04-15-19  . Pancreatitis    secondary to ERCP    Past Surgical History:  Procedure Laterality Date  . abdominal plasty    . Carpel tunnel surgery left hand    . Childbirth x 2    . CHOLECYSTECTOMY    . GASTRIC BYPASS    . UPPER GASTROINTESTINAL ENDOSCOPY    . WISDOM TOOTH EXTRACTION      Current Outpatient Medications  Medication Sig Dispense Refill  . clonazePAM (KLONOPIN) 0.5 MG tablet Take 1 tablet (0.5 mg total) by mouth 2 (two) times daily as needed for anxiety. 30 tablet 1  . Cyanocobalamin (VITAMIN B12) 1000 MCG TBCR Take 1,000 mcg at bedtime by mouth.    . EPINEPHrine 0.3 mg/0.3 mL IJ SOAJ injection  Inject 0.3 mLs (0.3 mg total) into the muscle as needed for anaphylaxis. 1 each 1  . famotidine (PEPCID) 20 MG tablet Take 1 tablet (20 mg total) by mouth 2 (two) times daily. 180 tablet 3  . levothyroxine (SYNTHROID) 50 MCG tablet Take 1 tablet (50 mcg total) by mouth daily. 90 tablet 3  . pantoprazole (PROTONIX) 40 MG tablet Take 1 tablet (40 mg total) by mouth daily. 90 tablet 1  . pantoprazole (PROTONIX) 40 MG tablet Take 1 tablet (40 mg total) by mouth 2 (two) times daily before a meal. 180 tablet 3  . valACYclovir (VALTREX) 1000 MG tablet ONE  TABLET TWICE A DAY X 5 DAYS AS NEEDED FOR OUTBREAK 50 tablet 1   No current facility-administered medications for this visit.    Allergies as of 03/24/2020 - Review Complete 02/25/2020  Allergen Reaction Noted  . Vicodin [hydrocodone-acetaminophen] Itching 07/07/2010  . Bee venom  04/15/2019    Family History  Problem Relation Age of Onset  . Lung cancer Father   . Heart attack Father        Died at 2  . Colon cancer Neg Hx   . Esophageal cancer Neg Hx   . Rectal cancer Neg Hx   . Stomach cancer Neg Hx     Social History   Socioeconomic History  . Marital status: Married    Spouse name: Not on file  . Number of children: Not on file  . Years of education: Not on file  . Highest education level: Not on file  Occupational History  . Not on file  Tobacco Use  . Smoking status: Former Research scientist (life sciences)  . Smokeless tobacco: Never Used  Vaping Use  . Vaping Use: Never used  Substance and Sexual Activity  . Alcohol use: No    Alcohol/week: 0.0 standard drinks  . Drug use: No  . Sexual activity: Not on file  Other Topics Concern  . Not on file  Social History Narrative  . Not on file   Social Determinants of Health   Financial Resource Strain: Not on file  Food Insecurity: Not on file  Transportation Needs: Not on file  Physical Activity: Not on file  Stress: Not on file  Social Connections: Not on file  Intimate Partner Violence: Not on file    Review of Systems:    Constitutional: No weight loss, fever or chills Skin: No rash Cardiovascular: No chest pain Respiratory: No SOB  Gastrointestinal: See HPI and otherwise negative   Physical Exam:  Vital signs: BP 130/70   Pulse 64   Ht 5\' 2"  (1.575 m)   Wt 179 lb 3.2 oz (81.3 kg)   BMI 32.78 kg/m   Constitutional:   Pleasant Elderly Caucasian female appears to be in NAD, Well developed, Well nourished, alert and cooperative Respiratory: Respirations even and unlabored. Lungs clear to auscultation bilaterally.   No  wheezes, crackles, or rhonchi.  Cardiovascular: Normal S1, S2. No MRG. Regular rate and rhythm. No peripheral edema, cyanosis or pallor.  Gastrointestinal:  Soft, nondistended, nontender. No rebound or guarding. Normal bowel sounds. No appreciable masses or hepatomegaly. Rectal:  Not performed.  Psychiatric: Oriented to person, place and time. Demonstrates good judgement and reason without abnormal affect or behaviors.  No recent labs/imaging.  Assessment: 1.  GERD: Continues 2.  Globus sensation: Continues, recent EGD with reflux and otherwise normal  Plan: 1.  Discussed with patient that she truly feels that all of her symptoms are a side effect  from her new thyroid medication then she should call her PCP and discuss changing this or stopping it for a while. 2.  At this point would recommend that she stay on max therapy if possible for her GERD.  We will switch her to Omeprazole 40 mg twice daily and she will stop her Pantoprazole.  Hopefully this will not cause a drop in her blood pressure but if it does she will let us know.  She was told to continue her Famotidine 20 mg twice a day.  That way we can know she is using max therapy to treat her reflux symptoms.  If they continue past that could consider Carafate. 3.  Patient to follow in clinic in 2 months with Dr. Tarri Glenn.  Ellouise Newer, PA-C [facility] 03/24/2020, 10:04 AM  Cc: Isaac Bliss, [place]*

## 2020-03-25 DIAGNOSIS — M4726 Other spondylosis with radiculopathy, lumbar region: Secondary | ICD-10-CM | POA: Diagnosis not present

## 2020-03-25 DIAGNOSIS — M549 Dorsalgia, unspecified: Secondary | ICD-10-CM | POA: Diagnosis not present

## 2020-03-25 DIAGNOSIS — R221 Localized swelling, mass and lump, neck: Secondary | ICD-10-CM | POA: Diagnosis not present

## 2020-03-25 NOTE — Telephone Encounter (Signed)
Spoke with the pt and scheduled an appt on 03/30/2020.

## 2020-03-30 ENCOUNTER — Ambulatory Visit: Payer: Medicare Other | Admitting: Internal Medicine

## 2020-03-31 ENCOUNTER — Telehealth: Payer: Self-pay | Admitting: Physician Assistant

## 2020-03-31 ENCOUNTER — Other Ambulatory Visit: Payer: Self-pay

## 2020-03-31 ENCOUNTER — Ambulatory Visit: Payer: Medicare Other | Admitting: Internal Medicine

## 2020-03-31 ENCOUNTER — Ambulatory Visit (INDEPENDENT_AMBULATORY_CARE_PROVIDER_SITE_OTHER): Payer: Medicare Other | Admitting: Internal Medicine

## 2020-03-31 VITALS — BP 130/84 | HR 72 | Temp 98.1°F | Wt 177.9 lb

## 2020-03-31 DIAGNOSIS — K219 Gastro-esophageal reflux disease without esophagitis: Secondary | ICD-10-CM | POA: Diagnosis not present

## 2020-03-31 DIAGNOSIS — E039 Hypothyroidism, unspecified: Secondary | ICD-10-CM

## 2020-03-31 LAB — T3, FREE: T3, Free: 2.9 pg/mL (ref 2.3–4.2)

## 2020-03-31 LAB — TSH: TSH: 3.05 u[IU]/mL (ref 0.35–4.50)

## 2020-03-31 LAB — T4, FREE: Free T4: 0.93 ng/dL (ref 0.60–1.60)

## 2020-03-31 MED ORDER — OMEPRAZOLE 40 MG PO CPDR: 40.0000 mg | DELAYED_RELEASE_CAPSULE | Freq: Two times a day (BID) | ORAL | 3 refills | Status: DC

## 2020-03-31 NOTE — Telephone Encounter (Signed)
Called patient and she informed me that PCP sent her a prescription in for Omeprazole.

## 2020-03-31 NOTE — Telephone Encounter (Signed)
Inbound call from patient stating she does not feel well without the omeprazole medication and is requesting script be sent to [facility] on [address] since the [facility] has still not been received and she wants to go ahead and start taking meds asap.  Please advise.

## 2020-03-31 NOTE — Progress Notes (Signed)
Established Patient Office Visit     This visit occurred during the SARS-CoV-2 public health emergency.  Safety protocols were in place, including screening questions prior to the visit, additional usage of staff PPE, and extensive cleaning of exam room while observing appropriate contact time as indicated for disinfecting solutions.    CC/Reason for Visit: Discuss continued throat pain  HPI: Jaime Fuller is a 70 y.o. female who is coming in today for the above mentioned reasons.  She continues to have issues with constant clearing of her throat and burning in the center of her chest.  She was thought to have GERD and started on twice daily PPI therapy.  When she failed to improve I sent her to GI.  She had an EGD with biopsies that showed some inflammation.  Of note she has had a Roux-en-Y gastric bypass in 2004.  It appears that at some point she had stopped taking her twice daily PPI and was instead taking it once daily because she thought that it was contributing to some low blood pressures that she was experiencing while at physical therapy.  Her symptoms worsened.  She went back to see GI about a week ago and was again asked to take twice daily Prilosec 40 mg in addition to twice daily Pepcid 20 mg.  She has not started taking this as of yet as her prescription was sent to mail order pharmacy.  She feels as though her food takes a lot longer to travel down her food pipe.  She feels like she is having increased trouble swallowing despite taking smaller sips and bites.  Throughout her visit today she has had constant clearing of her throat.  Reading GIs last note, there was concern that the levothyroxine could be contributing and so she is back here today to talk about this.   Past Medical/Surgical History: Past Medical History:  Diagnosis Date  . Anxiety state, unspecified 09/20/2007  . Arthritis   . Cataract   . Chronic kidney disease    kidney stones  . HYPOTHYROIDISM 03/08/2007    no meds needed now 04-15-19  . Pancreatitis    secondary to ERCP    Past Surgical History:  Procedure Laterality Date  . abdominal plasty    . Carpel tunnel surgery left hand    . Childbirth x 2    . CHOLECYSTECTOMY    . GASTRIC BYPASS    . UPPER GASTROINTESTINAL ENDOSCOPY    . WISDOM TOOTH EXTRACTION      Social History:  reports that she has quit smoking. She has never used smokeless tobacco. She reports that she does not drink alcohol and does not use drugs.  Allergies: Allergies  Allergen Reactions  . Vicodin [Hydrocodone-Acetaminophen] Itching  . Bee Venom     anaphylactic    Family History:  Family History  Problem Relation Age of Onset  . Lung cancer Father   . Heart attack Father        Died at 20  . Colon cancer Neg Hx   . Esophageal cancer Neg Hx   . Rectal cancer Neg Hx   . Stomach cancer Neg Hx      Current Outpatient Medications:  .  EPINEPHrine 0.3 mg/0.3 mL IJ SOAJ injection, Inject 0.3 mLs (0.3 mg total) into the muscle as needed for anaphylaxis., Disp: 1 each, Rfl: 1 .  famotidine (PEPCID) 20 MG tablet, Take 1 tablet (20 mg total) by mouth 2 (two) times daily. (Patient  taking differently: Take 20 mg by mouth daily.), Disp: 180 tablet, Rfl: 3 .  levothyroxine (SYNTHROID) 50 MCG tablet, Take 1 tablet (50 mcg total) by mouth daily., Disp: 90 tablet, Rfl: 3 .  valACYclovir (VALTREX) 1000 MG tablet, ONE TABLET TWICE A DAY X 5 DAYS AS NEEDED FOR OUTBREAK, Disp: 50 tablet, Rfl: 1 .  clonazePAM (KLONOPIN) 0.5 MG tablet, Take 1 tablet (0.5 mg total) by mouth 2 (two) times daily as needed for anxiety. (Patient not taking: Reported on 03/31/2020), Disp: 30 tablet, Rfl: 1 .  omeprazole (PRILOSEC) 40 MG capsule, Take 1 capsule (40 mg total) by mouth in the morning and at bedtime., Disp: 180 capsule, Rfl: 3  Review of Systems:  Constitutional: Denies fever, chills, diaphoresis, appetite change and fatigue.  HEENT: Denies photophobia, eye pain, redness, hearing  loss, ear pain, congestion, sore throat, rhinorrhea, sneezing, mouth sores, trouble swallowing, neck pain, neck stiffness and tinnitus.   Respiratory: Denies SOB, DOE, cough, chest tightness,  and wheezing.   Cardiovascular: Denies chest pain, palpitations and leg swelling.  Gastrointestinal: Denies nausea, vomiting, abdominal pain, diarrhea, constipation, blood in stool and abdominal distention.  Genitourinary: Denies dysuria, urgency, frequency, hematuria, flank pain and difficulty urinating.  Endocrine: Denies: hot or cold intolerance, sweats, changes in hair or nails, polyuria, polydipsia. Musculoskeletal: Denies myalgias, back pain, joint swelling, arthralgias and gait problem.  Skin: Denies pallor, rash and wound.  Neurological: Denies dizziness, seizures, syncope, weakness, light-headedness, numbness and headaches.  Hematological: Denies adenopathy. Easy bruising, personal or family bleeding history  Psychiatric/Behavioral: Denies suicidal ideation, mood changes, confusion, nervousness, sleep disturbance and agitation    Physical Exam: Vitals:   03/31/20 1144  BP: 130/84  Pulse: 72  Temp: 98.1 F (36.7 C)  TempSrc: Oral  SpO2: 97%  Weight: 177 lb 14.4 oz (80.7 kg)    Body mass index is 32.54 kg/m.  Constitutional: NAD, calm, comfortable Eyes: PERRL, lids and conjunctivae normal, wears corrective lenses Neck: Supple, no JVD, no lymphadenopathy, no bruits, no goiter ENMT: Mucous membranes are moist.  Neurologic: Grossly intact and nonfocal Psychiatric: Normal judgment and insight. Alert and oriented x 3. Normal mood.    Impression and Plan:  Gastroesophageal reflux disease, unspecified whether esophagitis present -I do not believe that her levothyroxine is at play here.  At last check her thyroid function tests were within normal limits on levothyroxine 50 mcg daily.  In addition to palpation today she does not have a goiter that could be contributing to compressive neck  symptoms. -However to fully rule out this possibility I will repeat thyroid function tests today and I will schedule her for thyroid ultrasound.. -I have suggested a good 6 to 8 weeks of full PPI/H2 blocker twice daily therapy as recommended by GI before we make any further recommendations. -I am concerned that she has started to have a little bit of dysphagia, unclear whether a barium esophagus or esophageal dilatation is needed.  Await further input by GI.  Hypothyroidism, unspecified type  - Plan: US THYROID, T3, free, T4, free, TSH.     Lelon Frohlich, MD [facility]

## 2020-04-06 ENCOUNTER — Ambulatory Visit
Admission: RE | Admit: 2020-04-06 | Discharge: 2020-04-06 | Disposition: A | Discharge status: Home / Self Care | Payer: Medicare Other | Source: Ambulatory Visit | Attending: Internal Medicine | Admitting: Internal Medicine

## 2020-04-06 DIAGNOSIS — E039 Hypothyroidism, unspecified: Secondary | ICD-10-CM | POA: Diagnosis not present

## 2020-04-06 DIAGNOSIS — E042 Nontoxic multinodular goiter: Secondary | ICD-10-CM | POA: Diagnosis not present

## 2020-04-06 DIAGNOSIS — R4702 Dysphasia: Secondary | ICD-10-CM | POA: Diagnosis not present

## 2020-04-07 ENCOUNTER — Telehealth: Payer: Self-pay | Admitting: Internal Medicine

## 2020-04-07 ENCOUNTER — Other Ambulatory Visit: Payer: Self-pay | Admitting: Internal Medicine

## 2020-04-07 DIAGNOSIS — M4726 Other spondylosis with radiculopathy, lumbar region: Secondary | ICD-10-CM | POA: Diagnosis not present

## 2020-04-07 DIAGNOSIS — E041 Nontoxic single thyroid nodule: Secondary | ICD-10-CM

## 2020-04-07 DIAGNOSIS — M549 Dorsalgia, unspecified: Secondary | ICD-10-CM | POA: Diagnosis not present

## 2020-04-07 DIAGNOSIS — R221 Localized swelling, mass and lump, neck: Secondary | ICD-10-CM | POA: Diagnosis not present

## 2020-04-07 NOTE — Telephone Encounter (Signed)
Pt returned the call to the office. 

## 2020-04-07 NOTE — Telephone Encounter (Signed)
Did not call the patient.  Not sure who called.

## 2020-04-08 ENCOUNTER — Other Ambulatory Visit: Payer: Self-pay

## 2020-04-09 ENCOUNTER — Encounter: Payer: Self-pay | Admitting: Internal Medicine

## 2020-04-09 ENCOUNTER — Ambulatory Visit
Admission: RE | Admit: 2020-04-09 | Discharge: 2020-04-09 | Disposition: A | Discharge status: Home / Self Care | Payer: Medicare Other | Source: Ambulatory Visit | Attending: Internal Medicine | Admitting: Internal Medicine

## 2020-04-09 ENCOUNTER — Other Ambulatory Visit (HOSPITAL_COMMUNITY)
Admission: RE | Admit: 2020-04-09 | Discharge: 2020-04-09 | Disposition: A | Discharge status: Home / Self Care | Payer: Medicare Other | Source: Ambulatory Visit | Attending: Internal Medicine | Admitting: Internal Medicine

## 2020-04-09 ENCOUNTER — Ambulatory Visit (INDEPENDENT_AMBULATORY_CARE_PROVIDER_SITE_OTHER): Payer: Medicare Other | Admitting: Internal Medicine

## 2020-04-09 VITALS — BP 110/80 | HR 72 | Temp 98.0°F | Wt 179.2 lb

## 2020-04-09 DIAGNOSIS — R896 Abnormal cytological findings in specimens from other organs, systems and tissues: Secondary | ICD-10-CM | POA: Diagnosis not present

## 2020-04-09 DIAGNOSIS — D440 Neoplasm of uncertain behavior of thyroid gland: Secondary | ICD-10-CM | POA: Diagnosis not present

## 2020-04-09 DIAGNOSIS — E041 Nontoxic single thyroid nodule: Secondary | ICD-10-CM | POA: Diagnosis not present

## 2020-04-09 NOTE — Progress Notes (Signed)
Established Patient Office Visit     This visit occurred during the SARS-CoV-2 public health emergency.  Safety protocols were in place, including screening questions prior to the visit, additional usage of staff PPE, and extensive cleaning of exam room while observing appropriate contact time as indicated for disinfecting solutions.    CC/Reason for Visit: Follow-up thyroid ultrasound results  HPI: Jaime Fuller is a 70 y.o. female who is coming in today for the above mentioned reasons.  Jaime Fuller has been having what she describes as throat pain and compressive type symptoms of her throat.  She had an EGD in December 2021 that showed some upper and lower esophageal inflammation presumably due to reflux.  She has been taking PPI and H2 blockers twice daily.  She has not had much relief.  She has a history of hypothyroidism.  She wanted to rule out compression of the thyroid so we ordered an ultrasound.  She had 3 nodules, 1 of which the radiologist is recommending biopsy for.  She would like to know next steps.   Past Medical/Surgical History: Past Medical History:  Diagnosis Date  . Anxiety state, unspecified 09/20/2007  . Arthritis   . Cataract   . Chronic kidney disease    kidney stones  . HYPOTHYROIDISM 03/08/2007   no meds needed now 04-15-19  . Pancreatitis    secondary to ERCP    Past Surgical History:  Procedure Laterality Date  . abdominal plasty    . Carpel tunnel surgery left hand    . Childbirth x 2    . CHOLECYSTECTOMY    . GASTRIC BYPASS    . UPPER GASTROINTESTINAL ENDOSCOPY    . WISDOM TOOTH EXTRACTION      Social History:  reports that she has quit smoking. She has never used smokeless tobacco. She reports that she does not drink alcohol and does not use drugs.  Allergies: Allergies  Allergen Reactions  . Vicodin [Hydrocodone-Acetaminophen] Itching  . Bee Venom     anaphylactic    Family History:  Family History  Problem Relation Age of Onset  .  Lung cancer Father   . Heart attack Father        Died at 55  . Colon cancer Neg Hx   . Esophageal cancer Neg Hx   . Rectal cancer Neg Hx   . Stomach cancer Neg Hx      Current Outpatient Medications:  .  clonazePAM (KLONOPIN) 0.5 MG tablet, Take 1 tablet (0.5 mg total) by mouth 2 (two) times daily as needed for anxiety., Disp: 30 tablet, Rfl: 1 .  EPINEPHrine 0.3 mg/0.3 mL IJ SOAJ injection, Inject 0.3 mLs (0.3 mg total) into the muscle as needed for anaphylaxis., Disp: 1 each, Rfl: 1 .  famotidine (PEPCID) 20 MG tablet, Take 1 tablet (20 mg total) by mouth 2 (two) times daily. (Patient taking differently: Take 20 mg by mouth daily.), Disp: 180 tablet, Rfl: 3 .  levothyroxine (SYNTHROID) 50 MCG tablet, Take 1 tablet (50 mcg total) by mouth daily., Disp: 90 tablet, Rfl: 3 .  omeprazole (PRILOSEC) 40 MG capsule, Take 1 capsule (40 mg total) by mouth in the morning and at bedtime., Disp: 180 capsule, Rfl: 3 .  valACYclovir (VALTREX) 1000 MG tablet, ONE TABLET TWICE A DAY X 5 DAYS AS NEEDED FOR OUTBREAK, Disp: 50 tablet, Rfl: 1  Review of Systems:  Constitutional: Denies fever, chills, diaphoresis, appetite change and fatigue.  HEENT: Denies photophobia, eye pain, redness, hearing  loss, ear pain, congestion,  rhinorrhea, sneezing, mouth sores, neck stiffness and tinnitus.   Respiratory: Denies SOB, DOE, cough, chest tightness,  and wheezing.   Cardiovascular: Denies chest pain, palpitations and leg swelling.  Gastrointestinal: Denies nausea, vomiting, abdominal pain, diarrhea, constipation, blood in stool and abdominal distention.  Genitourinary: Denies dysuria, urgency, frequency, hematuria, flank pain and difficulty urinating.  Endocrine: Denies: hot or cold intolerance, sweats, changes in hair or nails, polyuria, polydipsia. Musculoskeletal: Denies myalgias, back pain, joint swelling, arthralgias and gait problem.  Skin: Denies pallor, rash and wound.  Neurological: Denies dizziness,  seizures, syncope, weakness, light-headedness, numbness and headaches.  Hematological: Denies adenopathy. Easy bruising, personal or family bleeding history  Psychiatric/Behavioral: Denies suicidal ideation, mood changes, confusion, nervousness, sleep disturbance and agitation    Physical Exam: Vitals:   04/09/20 0837  BP: 110/80  Pulse: 72  Temp: 98 F (36.7 C)  SpO2: 98%  Weight: 179 lb 3.2 oz (81.3 kg)    Body mass index is 32.78 kg/m.  Constitutional: NAD, calm, comfortable Eyes: PERRL, lids and conjunctivae normal, wears corrective lenses ENMT: Mucous membranes are moist.  Neck: normal, supple, no masses, no thyromegaly Neurologic: Grossly intact and nonfocal Psychiatric: Normal judgment and insight. Alert and oriented x 3. Normal mood.    Impression and Plan:  Thyroid nodule -This has her obviously anxious. -Fortunately her biopsy has been scheduled for today, we should have some pathology results for early next week.  I have advised that we will contact her as soon as these are reviewed and plan for next steps. -I do not believe that this 1.6 cm nodule is causing compressive symptoms of her throat. -I will touch base with her GI doctor to see if any further recommendations, can consider CT scan/ENT referral pending biopsy results.   There are no Patient Instructions on file for this visit.    Lelon Frohlich, MD [facility]

## 2020-04-12 DIAGNOSIS — M549 Dorsalgia, unspecified: Secondary | ICD-10-CM | POA: Diagnosis not present

## 2020-04-12 DIAGNOSIS — M4726 Other spondylosis with radiculopathy, lumbar region: Secondary | ICD-10-CM | POA: Diagnosis not present

## 2020-04-12 DIAGNOSIS — R221 Localized swelling, mass and lump, neck: Secondary | ICD-10-CM | POA: Diagnosis not present

## 2020-04-12 LAB — CYTOLOGY - NON PAP

## 2020-04-13 ENCOUNTER — Encounter: Payer: Self-pay | Admitting: Internal Medicine

## 2020-04-13 ENCOUNTER — Other Ambulatory Visit: Payer: Self-pay | Admitting: Internal Medicine

## 2020-04-13 ENCOUNTER — Encounter: Payer: Self-pay | Admitting: *Deleted

## 2020-04-13 DIAGNOSIS — E041 Nontoxic single thyroid nodule: Secondary | ICD-10-CM

## 2020-04-13 NOTE — Telephone Encounter (Signed)
Spoke with patient. See result note.  

## 2020-04-14 ENCOUNTER — Other Ambulatory Visit: Payer: Self-pay | Admitting: Internal Medicine

## 2020-04-14 DIAGNOSIS — M4726 Other spondylosis with radiculopathy, lumbar region: Secondary | ICD-10-CM | POA: Diagnosis not present

## 2020-04-14 DIAGNOSIS — R221 Localized swelling, mass and lump, neck: Secondary | ICD-10-CM | POA: Diagnosis not present

## 2020-04-14 DIAGNOSIS — M549 Dorsalgia, unspecified: Secondary | ICD-10-CM | POA: Diagnosis not present

## 2020-04-14 DIAGNOSIS — Z1231 Encounter for screening mammogram for malignant neoplasm of breast: Secondary | ICD-10-CM

## 2020-04-19 DIAGNOSIS — R221 Localized swelling, mass and lump, neck: Secondary | ICD-10-CM | POA: Diagnosis not present

## 2020-04-19 DIAGNOSIS — M549 Dorsalgia, unspecified: Secondary | ICD-10-CM | POA: Diagnosis not present

## 2020-04-19 DIAGNOSIS — M4726 Other spondylosis with radiculopathy, lumbar region: Secondary | ICD-10-CM | POA: Diagnosis not present

## 2020-04-21 DIAGNOSIS — M549 Dorsalgia, unspecified: Secondary | ICD-10-CM | POA: Diagnosis not present

## 2020-04-21 DIAGNOSIS — M4726 Other spondylosis with radiculopathy, lumbar region: Secondary | ICD-10-CM | POA: Diagnosis not present

## 2020-04-21 DIAGNOSIS — R221 Localized swelling, mass and lump, neck: Secondary | ICD-10-CM | POA: Diagnosis not present

## 2020-04-23 ENCOUNTER — Encounter: Payer: Self-pay | Admitting: Internal Medicine

## 2020-04-28 DIAGNOSIS — M5117 Intervertebral disc disorders with radiculopathy, lumbosacral region: Secondary | ICD-10-CM | POA: Diagnosis not present

## 2020-04-28 DIAGNOSIS — M4727 Other spondylosis with radiculopathy, lumbosacral region: Secondary | ICD-10-CM | POA: Diagnosis not present

## 2020-04-28 DIAGNOSIS — M4726 Other spondylosis with radiculopathy, lumbar region: Secondary | ICD-10-CM | POA: Diagnosis not present

## 2020-04-28 DIAGNOSIS — M5116 Intervertebral disc disorders with radiculopathy, lumbar region: Secondary | ICD-10-CM | POA: Diagnosis not present

## 2020-04-30 ENCOUNTER — Ambulatory Visit: Payer: Medicare Other | Admitting: Gastroenterology

## 2020-05-04 ENCOUNTER — Encounter (HOSPITAL_COMMUNITY): Payer: Self-pay

## 2020-05-05 DIAGNOSIS — M5126 Other intervertebral disc displacement, lumbar region: Secondary | ICD-10-CM | POA: Diagnosis not present

## 2020-05-05 DIAGNOSIS — Z6832 Body mass index (BMI) 32.0-32.9, adult: Secondary | ICD-10-CM | POA: Diagnosis not present

## 2020-05-06 DIAGNOSIS — M5117 Intervertebral disc disorders with radiculopathy, lumbosacral region: Secondary | ICD-10-CM | POA: Diagnosis not present

## 2020-05-06 DIAGNOSIS — M5126 Other intervertebral disc displacement, lumbar region: Secondary | ICD-10-CM | POA: Diagnosis not present

## 2020-05-06 DIAGNOSIS — M5137 Other intervertebral disc degeneration, lumbosacral region: Secondary | ICD-10-CM | POA: Diagnosis not present

## 2020-05-14 ENCOUNTER — Other Ambulatory Visit: Payer: Self-pay

## 2020-05-18 ENCOUNTER — Other Ambulatory Visit: Payer: Self-pay

## 2020-05-18 ENCOUNTER — Ambulatory Visit (INDEPENDENT_AMBULATORY_CARE_PROVIDER_SITE_OTHER): Payer: Medicare Other | Admitting: Endocrinology

## 2020-05-18 ENCOUNTER — Encounter: Payer: Self-pay | Admitting: Endocrinology

## 2020-05-18 DIAGNOSIS — E042 Nontoxic multinodular goiter: Secondary | ICD-10-CM | POA: Insufficient documentation

## 2020-05-18 DIAGNOSIS — M542 Cervicalgia: Secondary | ICD-10-CM | POA: Diagnosis not present

## 2020-05-18 NOTE — Patient Instructions (Addendum)
The thyroid is not the cause of your throat symptoms, so you should continue to consider other causes.   Please see an ENT specialist.  you will receive a phone call, about a day and time for an appointment.   Please come back for a follow-up appointment in 4 months.  Please do the ultrasound approx 1 week prior.

## 2020-05-18 NOTE — Progress Notes (Signed)
Subjective:    Patient ID: Jaime Fuller, female    DOB: [birth year], 70 y.o.   MRN: [id number]  HPI Pt is referred by Dr Doreatha Lew, for nodular thyroid.  Pt was noted to have a thyroid nodule in.  she has no h/o XRT or surgery to the neck.  She took synthroid 1985-2009.  Since then, she has been euthyroid off rx.  Main symptom is ongoing sore throat.   Past Medical History:  Diagnosis Date  . Anxiety state, unspecified 09/20/2007  . Arthritis   . Cataract   . Chronic kidney disease    kidney stones  . HYPOTHYROIDISM 03/08/2007   no meds needed now 04-15-19  . Pancreatitis    secondary to ERCP    Past Surgical History:  Procedure Laterality Date  . abdominal plasty    . Carpel tunnel surgery left hand    . Childbirth x 2    . CHOLECYSTECTOMY    . GASTRIC BYPASS    . UPPER GASTROINTESTINAL ENDOSCOPY    . WISDOM TOOTH EXTRACTION      Social History   Socioeconomic History  . Marital status: Married    Spouse name: Not on file  . Number of children: Not on file  . Years of education: Not on file  . Highest education level: Not on file  Occupational History  . Not on file  Tobacco Use  . Smoking status: Former Research scientist (life sciences)  . Smokeless tobacco: Never Used  Vaping Use  . Vaping Use: Never used  Substance and Sexual Activity  . Alcohol use: No    Alcohol/week: 0.0 standard drinks  . Drug use: No  . Sexual activity: Not on file  Other Topics Concern  . Not on file  Social History Narrative  . Not on file   Social Determinants of Health   Financial Resource Strain: Not on file  Food Insecurity: Not on file  Transportation Needs: Not on file  Physical Activity: Not on file  Stress: Not on file  Social Connections: Not on file  Intimate Partner Violence: Not on file    Current Outpatient Medications on File Prior to Visit  Medication Sig Dispense Refill  . clonazePAM (KLONOPIN) 0.5 MG tablet Take 1 tablet (0.5 mg total) by mouth 2 (two) times daily as needed for anxiety.  30 tablet 1  . EPINEPHrine 0.3 mg/0.3 mL IJ SOAJ injection Inject 0.3 mLs (0.3 mg total) into the muscle as needed for anaphylaxis. 1 each 1  . famotidine (PEPCID) 20 MG tablet Take 1 tablet (20 mg total) by mouth 2 (two) times daily. (Patient taking differently: Take 20 mg by mouth daily.) 180 tablet 3  . levothyroxine (SYNTHROID) 50 MCG tablet Take 1 tablet (50 mcg total) by mouth daily. 90 tablet 3  . omeprazole (PRILOSEC) 40 MG capsule Take 1 capsule (40 mg total) by mouth in the morning and at bedtime. 180 capsule 3  . valACYclovir (VALTREX) 1000 MG tablet ONE TABLET TWICE A DAY X 5 DAYS AS NEEDED FOR OUTBREAK 50 tablet 1   No current facility-administered medications on file prior to visit.    Allergies  Allergen Reactions  . Vicodin [Hydrocodone-Acetaminophen] Itching  . Bee Venom     anaphylactic    Family History  Problem Relation Age of Onset  . Thyroid disease Mother   . Lung cancer Father   . Heart attack Father        Died at 56  . Colon cancer Neg Hx   .  Esophageal cancer Neg Hx   . Rectal cancer Neg Hx   . Stomach cancer Neg Hx     BP 100/86 (BP Location: Right Arm, Patient Position: Sitting, Cuff Size: Large)   Pulse 79   Ht 5\' 3"  (1.6 m)   Wt 186 lb 12.8 oz (84.7 kg)   SpO2 98%   BMI 33.09 kg/m    Review of Systems Denies hoarseness, neck pain, and sob.      Objective:   Physical Exam VITAL SIGNS:  See vs page GENERAL: no distress NECK: The right thyroid nodule is palpable, and freely mobile.  No palpable lymphadenopathy at the anterior neck.      Lab Results  Component Value Date   TSH 3.05 03/31/2020   [place] (2022): Normal-sized thyroid with bilateral nodules. Recommend FNA biopsy of moderately suspicious 1.6 cm mid right nodule.  bx cytol: Atypia of undetermined significance (Bethesda category III)  Afirma: unable to evaluate  I have reviewed outside records, and summarized: Pt was noted to have MNG, and referred here.  esophageal source for  sxs was considered.       Assessment & Plan:  MNG, new to me: uncertain etiology and prognosis.  Neck sxs, not related to the above.   Patient Instructions  The thyroid is not the cause of your throat symptoms, so you should continue to consider other causes.   Please see an ENT specialist.  you will receive a phone call, about a day and time for an appointment.   Please come back for a follow-up appointment in 4 months.  Please do the ultrasound approx 1 week prior.

## 2020-06-02 ENCOUNTER — Ambulatory Visit: Payer: Medicare Other | Admitting: Gastroenterology

## 2020-06-02 ENCOUNTER — Ambulatory Visit
Admission: RE | Admit: 2020-06-02 | Discharge: 2020-06-02 | Disposition: A | Discharge status: Home / Self Care | Payer: Medicare Other | Source: Ambulatory Visit | Attending: Internal Medicine | Admitting: Internal Medicine

## 2020-06-02 ENCOUNTER — Other Ambulatory Visit: Payer: Self-pay

## 2020-06-02 DIAGNOSIS — Z1231 Encounter for screening mammogram for malignant neoplasm of breast: Secondary | ICD-10-CM | POA: Diagnosis not present

## 2020-06-07 ENCOUNTER — Other Ambulatory Visit: Payer: Self-pay

## 2020-06-07 ENCOUNTER — Encounter: Payer: Self-pay | Admitting: Gastroenterology

## 2020-06-07 ENCOUNTER — Ambulatory Visit (INDEPENDENT_AMBULATORY_CARE_PROVIDER_SITE_OTHER): Payer: Medicare Other | Admitting: Gastroenterology

## 2020-06-07 ENCOUNTER — Ambulatory Visit (INDEPENDENT_AMBULATORY_CARE_PROVIDER_SITE_OTHER): Payer: Medicare Other | Admitting: Otolaryngology

## 2020-06-07 VITALS — BP 110/60 | HR 48 | Ht 63.0 in | Wt 185.0 lb

## 2020-06-07 DIAGNOSIS — R0989 Other specified symptoms and signs involving the circulatory and respiratory systems: Secondary | ICD-10-CM

## 2020-06-07 DIAGNOSIS — R221 Localized swelling, mass and lump, neck: Secondary | ICD-10-CM | POA: Diagnosis not present

## 2020-06-07 DIAGNOSIS — K219 Gastro-esophageal reflux disease without esophagitis: Secondary | ICD-10-CM | POA: Diagnosis not present

## 2020-06-07 DIAGNOSIS — R198 Other specified symptoms and signs involving the digestive system and abdomen: Secondary | ICD-10-CM | POA: Diagnosis not present

## 2020-06-07 DIAGNOSIS — M549 Dorsalgia, unspecified: Secondary | ICD-10-CM | POA: Diagnosis not present

## 2020-06-07 DIAGNOSIS — M4726 Other spondylosis with radiculopathy, lumbar region: Secondary | ICD-10-CM | POA: Diagnosis not present

## 2020-06-07 MED ORDER — SUCRALFATE 1 GM/10ML PO SUSP: 1.0000 g | Freq: Four times a day (QID) | ORAL | 1 refills | Status: DC

## 2020-06-07 MED ORDER — OMEPRAZOLE 40 MG PO CPDR: 40.0000 mg | DELAYED_RELEASE_CAPSULE | Freq: Two times a day (BID) | ORAL | 0 refills | Status: DC

## 2020-06-07 MED ORDER — FAMOTIDINE 20 MG PO TABS: 20.0000 mg | ORAL_TABLET | Freq: Two times a day (BID) | ORAL | 3 refills | Status: DC

## 2020-06-07 NOTE — Progress Notes (Signed)
Referring Provider: Isaac Bliss, [facility]* Primary Care Physician:  Isaac Bliss, Rayford Halsted, MD  Reason for Consultation:  GERD   IMPRESSION:  Globus and biopsy-proven reflux esophagitis not responding to maximal medical therapy    -ongoing symptoms despite PPI twice daily and H2 blocker twice daily    -given the associated neck and ear pain evaluation by ENT is indicated    -consider upper GI series 24-hour pH probe/Bravo   History of colon polyps    - 4 tubular adenomas removed 05/23/19    - surveillance recommended 2024  Gastric bypass 2004 Cholecystectomy for symptomatic cholelithiasis History of post ERCP pancreatitis 1996   PLAN: - Continue PPI BID and H2B BID - Add Carafate 1g slurry QID to improve symptomatic relief - ENT evaluation is pending - Consider upper GI series +/- 24 hour pH probe/Bravo on medications if symptoms are not improving and ENT evaluation is unrevealing  Please see the "Patient Instructions" section for addition details about the plan.  I spent 30 minutes, including in depth chart review, face-to-face time with the patient, coordinating care, and ordering studies and medications as appropriate, and documentation.  HPI: Jaime Fuller is a 70 y.o. female who returns in follow-up for reflux.  She has a history of distant cholecystectomy for symptomatic cholelithiasis, post-ERCP pancreatitis 1996, gastric bypass 2004, and evaluated by Dr. Olevia Perches in 2006 for anemia.  She also has generalized anxiety disorder, chronic back pain, hypothyroidism, and a history of pernicious anemia.  Symptoms date back to time of colonoscopy 05/2019. Had the Covid vaccine soon thereafter with resulting palpable lymph nodes. Lymph notes became smaller, but she was left with an ongoing sensation of globus in her neck/throat.  Associated dysphonia, frequent coughing. Sore when she touches her sternal notch with the sensation that she is choking. Was occasionally experiencing  neck pain that radiates to her ears and dysphonia but now feels like her neck is on fire with worsening bilateral ear pain. No heartburn, regurgitation, brash. No anemia, dysphagia, early satiety, hematemesis, hematochezia, melena, odynophagia, or weight loss. Noted temporary improvement after steroid injection.  Symptoms not improved despite: - PPI BID (pantoprazole and then omeprazole which causes flatus) - Famotidine 20 mg BID - Removing carbonated beverages from the diet - Discontinuation of Celebrex  Recent evaluation includes: - EGD 12/21 showed biopsy-proven reflux esophagitis, evidence for prior gastric bypass, and hyperplastic gastric polyp - Thyroid ultrasound showed a 1.6cm right nodule. Biopsy was indeterminate. Nodule thought to be too small to explain her symptoms.   Was supposed to see Dr. Lucia Gaskins with ENT today for consult today. But the appointment was cancelled and has not yet been rescheduled.    Prior endoscopic history: - Screening colonoscopy with Dr. Olevia Perches 05/05/2004: normal - EGD with Dr. Olevia Perches 10/18/2004 to evaluate anemia, and chest pain:GE junction at 35 cm, gastric bypass anastomosis present, otherwise normal.  Biopsies of the small bowel were normal. - Colonoscopy 05/23/19: left sided diverticulosis, 4 tubular adenomas - EGD 12/21 showed biopsy-proven reflux esophagitis, evidence for prior gastric bypass, and hyperplastic gastric polyp   Past Medical History:  Diagnosis Date  . Anxiety state, unspecified 09/20/2007  . Arthritis   . Cataract   . Chronic kidney disease    kidney stones  . HYPOTHYROIDISM 03/08/2007   no meds needed now 04-15-19  . Pancreatitis    secondary to ERCP    Past Surgical History:  Procedure Laterality Date  . abdominal plasty    . Carpel tunnel surgery  left hand    . Childbirth x 2    . CHOLECYSTECTOMY    . GASTRIC BYPASS    . UPPER GASTROINTESTINAL ENDOSCOPY    . WISDOM TOOTH EXTRACTION      Current Outpatient Medications   Medication Sig Dispense Refill  . clonazePAM (KLONOPIN) 0.5 MG tablet Take 1 tablet (0.5 mg total) by mouth 2 (two) times daily as needed for anxiety. 30 tablet 1  . EPINEPHrine 0.3 mg/0.3 mL IJ SOAJ injection Inject 0.3 mLs (0.3 mg total) into the muscle as needed for anaphylaxis. 1 each 1  . famotidine (PEPCID) 20 MG tablet Take 1 tablet (20 mg total) by mouth 2 (two) times daily. 180 tablet 3  . levothyroxine (SYNTHROID) 50 MCG tablet Take 1 tablet (50 mcg total) by mouth daily. 90 tablet 3  . omeprazole (PRILOSEC) 40 MG capsule Take 1 capsule (40 mg total) by mouth in the morning and at bedtime. 180 capsule 3  . valACYclovir (VALTREX) 1000 MG tablet ONE TABLET TWICE A DAY X 5 DAYS AS NEEDED FOR OUTBREAK 50 tablet 1   No current facility-administered medications for this visit.    Allergies as of 06/07/2020 - Review Complete 06/07/2020  Allergen Reaction Noted  . Vicodin [hydrocodone-acetaminophen] Itching 07/07/2010  . Bee venom  04/15/2019    Family History  Problem Relation Age of Onset  . Thyroid disease Mother   . Lung cancer Father   . Heart attack Father        Died at 42  . Colon cancer Neg Hx   . Esophageal cancer Neg Hx   . Rectal cancer Neg Hx   . Stomach cancer Neg Hx       Physical Exam: General:   Alert,  well-nourished, pleasant and cooperative in NAD Head:  Normocephalic and atraumatic. Eyes:  Sclera clear, no icterus.   Conjunctiva pink. Ears:  Normal auditory acuity. Nose:  No deformity, discharge,  or lesions. Mouth:  No deformity or lesions.   Neck:  Supple; no masses or thyromegaly. Lungs:  Clear throughout to auscultation.   No wheezes. Heart:  Regular rate and rhythm; no murmurs. Abdomen:  Soft ,nontender, nondistended, normal bowel sounds, no rebound or guarding. No hepatosplenomegaly.   Rectal:  Deferred  Msk:  Symmetrical. No boney deformities LAD: No inguinal or umbilical LAD Extremities:  No clubbing or edema. Neurologic:  Alert and   oriented x4;  grossly nonfocal Skin:  Intact without significant lesions or rashes. Psych:  Alert and cooperative. Normal mood and affect.    Dael Howland L. Tarri Glenn, MD, MPH 06/07/2020, 10:48 AM

## 2020-06-07 NOTE — Patient Instructions (Signed)
If you are age 69 or older, your body mass index should be between 23-30. Your Body mass index is 32.77 kg/m. If this is out of the aforementioned range listed, please consider follow up with your Primary Care Provider.  If you are age 58 or younger, your body mass index should be between 19-25. Your Body mass index is 32.77 kg/m. If this is out of the aformentioned range listed, please consider follow up with your Primary Care Provider.    Continue medicatons as directed.   Thank you for trusting me with your gastrointestinal care!    Thornton Park, MD, MPH

## 2020-06-09 DIAGNOSIS — R221 Localized swelling, mass and lump, neck: Secondary | ICD-10-CM | POA: Diagnosis not present

## 2020-06-09 DIAGNOSIS — M4726 Other spondylosis with radiculopathy, lumbar region: Secondary | ICD-10-CM | POA: Diagnosis not present

## 2020-06-09 DIAGNOSIS — M549 Dorsalgia, unspecified: Secondary | ICD-10-CM | POA: Diagnosis not present

## 2020-06-10 ENCOUNTER — Other Ambulatory Visit: Payer: Self-pay

## 2020-06-10 ENCOUNTER — Ambulatory Visit (INDEPENDENT_AMBULATORY_CARE_PROVIDER_SITE_OTHER): Payer: Medicare Other | Admitting: Otolaryngology

## 2020-06-10 VITALS — Temp 94.1°F

## 2020-06-10 DIAGNOSIS — E041 Nontoxic single thyroid nodule: Secondary | ICD-10-CM

## 2020-06-10 DIAGNOSIS — J029 Acute pharyngitis, unspecified: Secondary | ICD-10-CM | POA: Diagnosis not present

## 2020-06-10 DIAGNOSIS — K219 Gastro-esophageal reflux disease without esophagitis: Secondary | ICD-10-CM

## 2020-06-10 NOTE — Progress Notes (Signed)
HPI: Jaime Fuller is a 70 y.o. female who presents is referred by Dr. Loanne Drilling for evaluation of right thyroid nodule found on recent ultrasound of the thyroid gland.  [name] has also complained of discomfort and pain in her neck that she has had for about a year that comes and goes.  She also complains of a burning sensation in her ears.  She has also had intermittent sore throat.  She has undergone upper GI endoscopy and diagnosed with significant GE reflux disease and has been treated with Prilosec 40 mg twice daily by Dr. Tarri Glenn. Ultrasound of the thyroid demonstrated a couple of nodules that were not suspicious however the patient did have a 1.6 cm right mid posterior nodule that warranted fine-needle aspirate which has been performed and demonstrated atypia of undetermined significance Bethesda category III.Marland Kitchen Afirma testing was unable to be performed because of low follicular content. She stated that her symptoms started shortly after receiving a Covid vaccine as well as initially starting thyroid hormone because of being hypothyroid.  She apparently has been under a fair amount of stress also.   Past Medical History:  Diagnosis Date  . Anxiety state, unspecified 09/20/2007  . Arthritis   . Cataract   . Chronic kidney disease    kidney stones  . HYPOTHYROIDISM 03/08/2007   no meds needed now 04-15-19  . Pancreatitis    secondary to ERCP   Past Surgical History:  Procedure Laterality Date  . abdominal plasty    . Carpel tunnel surgery left hand    . Childbirth x 2    . CHOLECYSTECTOMY    . GASTRIC BYPASS    . UPPER GASTROINTESTINAL ENDOSCOPY    . WISDOM TOOTH EXTRACTION     Social History   Socioeconomic History  . Marital status: Married    Spouse name: Not on file  . Number of children: Not on file  . Years of education: Not on file  . Highest education level: Not on file  Occupational History  . Not on file  Tobacco Use  . Smoking status: Former Research scientist (life sciences)  . Smokeless  tobacco: Never Used  Vaping Use  . Vaping Use: Never used  Substance and Sexual Activity  . Alcohol use: No    Alcohol/week: 0.0 standard drinks  . Drug use: No  . Sexual activity: Not on file  Other Topics Concern  . Not on file  Social History Narrative  . Not on file   Social Determinants of Health   Financial Resource Strain: Not on file  Food Insecurity: Not on file  Transportation Needs: Not on file  Physical Activity: Not on file  Stress: Not on file  Social Connections: Not on file   Family History  Problem Relation Age of Onset  . Thyroid disease Mother   . Lung cancer Father   . Heart attack Father        Died at 10  . Colon cancer Neg Hx   . Esophageal cancer Neg Hx   . Rectal cancer Neg Hx   . Stomach cancer Neg Hx    Allergies  Allergen Reactions  . Vicodin [Hydrocodone-Acetaminophen] Itching  . Bee Venom     anaphylactic   Prior to Admission medications   Medication Sig Start Date End Date Taking? Authorizing Provider  clonazePAM (KLONOPIN) 0.5 MG tablet Take 1 tablet (0.5 mg total) by mouth 2 (two) times daily as needed for anxiety. 01/06/20   Isaac Bliss, Rayford Halsted, MD  EPINEPHrine 0.3  mg/0.3 mL IJ SOAJ injection Inject 0.3 mLs (0.3 mg total) into the muscle as needed for anaphylaxis. 04/03/19   Isaac Bliss, Rayford Halsted, MD  famotidine (PEPCID) 20 MG tablet Take 1 tablet (20 mg total) by mouth 2 (two) times daily. 06/07/20   Thornton Park, MD  levothyroxine (SYNTHROID) 50 MCG tablet Take 1 tablet (50 mcg total) by mouth daily. 01/08/20   Isaac Bliss, Rayford Halsted, MD  omeprazole (PRILOSEC) 40 MG capsule Take 1 capsule (40 mg total) by mouth in the morning and at bedtime. 06/07/20   Thornton Park, MD  sucralfate (CARAFATE) 1 GM/10ML suspension Take 10 mLs (1 g total) by mouth 4 (four) times daily. 06/07/20   Thornton Park, MD  valACYclovir (VALTREX) 1000 MG tablet ONE TABLET TWICE A DAY X 5 DAYS AS NEEDED FOR OUTBREAK 05/27/19   Isaac Bliss, Rayford Halsted, MD     Positive ROS: Otherwise negative  All other systems have been reviewed and were otherwise negative with the exception of those mentioned in the HPI and as above.  Physical Exam: Constitutional: Alert, well-appearing, no acute distress Ears: External ears without lesions or tenderness. Ear canals are clear bilaterally.  TMs are clear bilaterally.  She has not noted any change in her hearing she has had no drainage from her ears.  Etiology of ear "burning" does not appear to be arising from the ear canal. Nasal: External nose without lesions. Septum midline with mild rhinitis.  Both middle meatus regions are clear.. Clear nasal passages bilaterally. Oral: Lips and gums without lesions. Tongue and palate mucosa without lesions. Posterior oropharynx clear.  Indirect laryngoscopy revealed a clear hypopharynx and larynx. Fiberoptic laryngoscopy was performed of the right nostril.  The nasopharynx was clear.  The base of tongue vallecula epiglottis were normal.  Piriform sinuses were clear.  Vocal cords were clear bilaterally.  She did have a moderate amount of mucosal edema and erythema involving the arytenoid mucosa consistent with probable laryngeal pharyngeal reflux.  But no specific mucosal lesions.  No signs of thrush.  No signs of infection.  No evidence of neoplasm. Neck: No palpable adenopathy or masses.  No palpable adenopathy in the neck.  Palpation of the right thyroid gland cannot say feel the thyroid nodule noted on ultrasound.  There is no supraclavicular adenopathy and no adenopathy in the anterior neck.  Of note she does have TMJ problems bilaterally which she has noted for a number of years. Respiratory: Breathing comfortably  Skin: No facial/neck lesions or rash noted.  Laryngoscopy  Date/Time: 06/10/2020 11:56 AM Performed by: Rozetta Nunnery, MD Authorized by: Rozetta Nunnery, MD   Consent:    Consent obtained:  Verbal   Consent given by:   Patient Procedure details:    Indications: direct visualization of the upper aerodigestive tract     Medication:  Afrin   Instrument: flexible fiberoptic laryngoscope   Sinus:    Right nasopharynx: normal   Mouth:    Oropharynx: normal     Vallecula: normal     Base of tongue: normal     Epiglottis: normal   Throat:    Pyriform sinus: normal     True vocal cords: normal   Comments:     On fiberoptic laryngoscopy patient had moderate mucosal edema and mild erythema of the arytenoid mucosa consistent with laryngeal pharyngeal reflux.  No signs of infection and no evidence of neoplasm as mucosa appears clear otherwise.    Assessment: 1.6 cm right thyroid nodule.  Bethesda category III on FNA. Sore throat is probably related to reflux but she has a normal upper airway examination otherwise. "Burning" sensation in her ears I suspect is neuropathic as this comes and goes rather quickly with normal ear canal and TM exam otherwise.  Plan: Briefly discussed with Ms. Pettet concerning the right thyroid nodule.  This would involve a right hemithyroidectomy or removal of her right thyroid lobe.  Discussed with her that there is a mild risk of causing hoarseness from damage to the recurrent nerve but this is minimal.  It would require surgery and overnight observation. The other alternative is to follow the thyroid nodule to see if it grows or enlarges and possibly repeat FNA. Concerning the sore throat I suspect this is most likely related to reflux problems as she has normal upper airway mucosa with no lesions and no signs of infection. I suspect the "burning" sensation she has in her ears is probably more neuropathic and not related to any actual ear problem.  Could also be related to stress. She will call us back if he decides to have a right thyroid lobectomy performed.  She apparently is scheduled to see Dr. Loanne Drilling in July and I think is scheduled for another ultrasound.   Radene Journey,  MD   CC:

## 2020-06-14 DIAGNOSIS — M549 Dorsalgia, unspecified: Secondary | ICD-10-CM | POA: Diagnosis not present

## 2020-06-14 DIAGNOSIS — R221 Localized swelling, mass and lump, neck: Secondary | ICD-10-CM | POA: Diagnosis not present

## 2020-06-14 DIAGNOSIS — M4726 Other spondylosis with radiculopathy, lumbar region: Secondary | ICD-10-CM | POA: Diagnosis not present

## 2020-06-16 DIAGNOSIS — M549 Dorsalgia, unspecified: Secondary | ICD-10-CM | POA: Diagnosis not present

## 2020-06-16 DIAGNOSIS — R221 Localized swelling, mass and lump, neck: Secondary | ICD-10-CM | POA: Diagnosis not present

## 2020-06-16 DIAGNOSIS — M4726 Other spondylosis with radiculopathy, lumbar region: Secondary | ICD-10-CM | POA: Diagnosis not present

## 2020-06-21 DIAGNOSIS — R221 Localized swelling, mass and lump, neck: Secondary | ICD-10-CM | POA: Diagnosis not present

## 2020-06-21 DIAGNOSIS — M4726 Other spondylosis with radiculopathy, lumbar region: Secondary | ICD-10-CM | POA: Diagnosis not present

## 2020-06-21 DIAGNOSIS — M549 Dorsalgia, unspecified: Secondary | ICD-10-CM | POA: Diagnosis not present

## 2020-06-22 ENCOUNTER — Telehealth: Payer: Self-pay | Admitting: Gastroenterology

## 2020-06-22 NOTE — Telephone Encounter (Signed)
Inbound call from patient. Stated she seen the ENT and they didn't find anything. Would like a call back to see what are the next steps in treatment.

## 2020-06-23 NOTE — Telephone Encounter (Signed)
Pt saw ENT, Dr. Lucia Gaskins, and states there was nothing found, the note is in epic. Pt wants to know what the next step is for her regarding treatment. Please advise.

## 2020-06-24 DIAGNOSIS — M549 Dorsalgia, unspecified: Secondary | ICD-10-CM | POA: Diagnosis not present

## 2020-06-24 DIAGNOSIS — M4726 Other spondylosis with radiculopathy, lumbar region: Secondary | ICD-10-CM | POA: Diagnosis not present

## 2020-06-24 DIAGNOSIS — R221 Localized swelling, mass and lump, neck: Secondary | ICD-10-CM | POA: Diagnosis not present

## 2020-06-25 ENCOUNTER — Encounter: Payer: Self-pay | Admitting: Internal Medicine

## 2020-06-27 NOTE — Telephone Encounter (Signed)
ENT notes reviewed. "On fiberoptic laryngoscopy patient had moderate mucosal edema and mild erythema of the arytenoid mucosa consistent with laryngeal pharyngeal reflux." Given these results, treatment of reflux is the priority. Given her history with omeprazole and pantoprazole, I recommend that she start lansoprazole 30 mg taken 30-60 minutes prior to breakfast every day. Please review lifestyle modifications with reflux. After at least 4 weeks on lansoprazole, if she has not started to feel any better, I would like for her to have 24 pH monitoring with impedence testing and esophageal manometry. These studies she be performed on PPI and H2Blocker therapy. She should have office follow-up with me or Anderson Malta 2 weeks after the manometry/pH probe to review the results. Thank you.

## 2020-06-28 DIAGNOSIS — M549 Dorsalgia, unspecified: Secondary | ICD-10-CM | POA: Diagnosis not present

## 2020-06-28 DIAGNOSIS — R221 Localized swelling, mass and lump, neck: Secondary | ICD-10-CM | POA: Diagnosis not present

## 2020-06-28 DIAGNOSIS — M4726 Other spondylosis with radiculopathy, lumbar region: Secondary | ICD-10-CM | POA: Diagnosis not present

## 2020-06-28 MED ORDER — LANSOPRAZOLE 30 MG PO CPDR: DELAYED_RELEASE_CAPSULE | ORAL | 3 refills | Status: DC

## 2020-06-28 NOTE — Telephone Encounter (Signed)
Spoke with pt and she is aware, prescription sent to pharmacy.

## 2020-07-02 DIAGNOSIS — M4726 Other spondylosis with radiculopathy, lumbar region: Secondary | ICD-10-CM | POA: Diagnosis not present

## 2020-07-02 DIAGNOSIS — R221 Localized swelling, mass and lump, neck: Secondary | ICD-10-CM | POA: Diagnosis not present

## 2020-07-02 DIAGNOSIS — M549 Dorsalgia, unspecified: Secondary | ICD-10-CM | POA: Diagnosis not present

## 2020-07-05 DIAGNOSIS — M4726 Other spondylosis with radiculopathy, lumbar region: Secondary | ICD-10-CM | POA: Diagnosis not present

## 2020-07-05 DIAGNOSIS — R221 Localized swelling, mass and lump, neck: Secondary | ICD-10-CM | POA: Diagnosis not present

## 2020-07-05 DIAGNOSIS — M549 Dorsalgia, unspecified: Secondary | ICD-10-CM | POA: Diagnosis not present

## 2020-07-07 DIAGNOSIS — R221 Localized swelling, mass and lump, neck: Secondary | ICD-10-CM | POA: Diagnosis not present

## 2020-07-07 DIAGNOSIS — M4726 Other spondylosis with radiculopathy, lumbar region: Secondary | ICD-10-CM | POA: Diagnosis not present

## 2020-07-07 DIAGNOSIS — M549 Dorsalgia, unspecified: Secondary | ICD-10-CM | POA: Diagnosis not present

## 2020-07-08 NOTE — Telephone Encounter (Signed)
Patient calling back stating lansoprazole medication only helped for 2 days and is currently having symptoms.  Please advise.

## 2020-07-09 NOTE — Telephone Encounter (Signed)
Pt reports she has been taking prevacid for 2 weeks and is really having a hard time with it. States she cannot sleep due to the chest discomfort due to reflux and she is eating tums like candy. States she is watching what she is eating and she is just miserable. Pt would like to know if she can go back on the omeprazole, states it did not take care of the problem all together but was much better than the prevacid. Please advise.

## 2020-07-09 NOTE — Telephone Encounter (Signed)
Absolutely. However, I thought the omeprazole caused significant gas. That's why we were trying something different. She may substitute omeprazole 40 mg daily for every dose of lansoprazole. I hope this helps her feel better soon. Thanks.

## 2020-07-09 NOTE — Telephone Encounter (Signed)
Spoke with pt and she is aware and will switch back to omeprazole. Pt will get back with [place] to let us know how she is doing.

## 2020-07-13 DIAGNOSIS — R221 Localized swelling, mass and lump, neck: Secondary | ICD-10-CM | POA: Diagnosis not present

## 2020-07-13 DIAGNOSIS — M4726 Other spondylosis with radiculopathy, lumbar region: Secondary | ICD-10-CM | POA: Diagnosis not present

## 2020-07-13 DIAGNOSIS — M549 Dorsalgia, unspecified: Secondary | ICD-10-CM | POA: Diagnosis not present

## 2020-07-14 NOTE — Telephone Encounter (Signed)
Pt states she is still having issues with reflux. She switched back from prevacid to omeprazole but reports she is just miserable. States she had to sleep in the recliner and still did not get much sleep. Pt scheduled to see Dr. Tarri Glenn tomorrow morning at 8:50am. Pt aware of appt.

## 2020-07-14 NOTE — Telephone Encounter (Signed)
Inbound call from the patient stating she is still having issues and requests a call from the nurse.  Please advise.

## 2020-07-15 ENCOUNTER — Ambulatory Visit (INDEPENDENT_AMBULATORY_CARE_PROVIDER_SITE_OTHER): Payer: Medicare Other | Admitting: Gastroenterology

## 2020-07-15 ENCOUNTER — Encounter: Payer: Self-pay | Admitting: Gastroenterology

## 2020-07-15 VITALS — BP 110/60 | HR 60 | Ht 61.42 in | Wt 182.2 lb

## 2020-07-15 DIAGNOSIS — R198 Other specified symptoms and signs involving the digestive system and abdomen: Secondary | ICD-10-CM | POA: Diagnosis not present

## 2020-07-15 DIAGNOSIS — K219 Gastro-esophageal reflux disease without esophagitis: Secondary | ICD-10-CM | POA: Diagnosis not present

## 2020-07-15 DIAGNOSIS — R0989 Other specified symptoms and signs involving the circulatory and respiratory systems: Secondary | ICD-10-CM

## 2020-07-15 MED ORDER — FAMOTIDINE 20 MG PO TABS: 20.0000 mg | ORAL_TABLET | Freq: Two times a day (BID) | ORAL | 3 refills | Status: DC

## 2020-07-15 MED ORDER — OMEPRAZOLE 40 MG PO CPDR: 40.0000 mg | DELAYED_RELEASE_CAPSULE | Freq: Two times a day (BID) | ORAL | 0 refills | Status: DC

## 2020-07-15 MED ORDER — VENLAFAXINE HCL ER 75 MG PO CP24: 75.0000 mg | ORAL_CAPSULE | Freq: Every day | ORAL | 2 refills | Status: DC

## 2020-07-15 MED ORDER — SUCRALFATE 1 GM/10ML PO SUSP: 1.0000 g | Freq: Four times a day (QID) | ORAL | 1 refills | Status: DC

## 2020-07-15 NOTE — Patient Instructions (Addendum)
It was a pleasure to see you today. Based on our discussion, I am providing you with my recommendations below:  RECOMMENDATION(S):    Please continue Pepcid and Prilosec as previously prescribed   To better evaluate your symptoms, I am recommending an Esophageal Manometry with 24 hr pH probe. Please refer to your instructions below.   I will send prescriptions for the following medications:  PRESCRIPTION MEDICATION(S):   We have sent the following medication(s) to your pharmacy:  . Carafate - please dissolve a 1g tablet in a small amount of water to create a slurry. You may take 4 times per day for symptomatic relief . Venlafaxine - please take 75mg  every evening for pain relief  NOTE: If your medication(s) requires a PRIOR AUTHORIZATION, we will receive notification from your pharmacy. Once received, the process to submit for approval may take up to 7-10 business days. You will be contacted about any denials we have received from your insurance company as well as alternatives recommended by your provider.  FOLLOW UP:  . After your procedure, you will receive a call from my office staff regarding my recommendation for follow up.  BMI:  . If you are age 69 or older, your body mass index should be between 23-30. Your Body mass index is 33.97 kg/m. If this is out of the aforementioned range listed, please consider follow up with your Primary Care Provider.  Thank you for trusting me with your gastrointestinal care!    Thornton Park, MD, MPH  You have been scheduled for an Esophageal Manometry and 24 Hour pH study at [facility] on 07/26/20 at 10:30 am. Please arrive 30 minutes prior to your procedure for registration. You will need to go to outpatient registration (1st floor of the hospital) first. Make certain to bring your insurance cards as well as a complete list of medications.  Please remember the following:  1) Do not take any muscle relaxants, xanax  (alprazolam) or ativan for 1 day prior to your test as well as the day of the test.  2) Nothing to eat or drink after 12:00 midnight on the night before your test.  3) Hold all diabetic medications/insulin the morning of the test. You may eat and take your medications after the test.  4) For 7 days prior to your test, do not take: Reglan, Tagamet, Zantac, Phenergan, Axid or Pepcid.  5) You MAY use an antacid such as Rolaids or Tums up to 12 hours prior to your test.  6) Continue taking Pepcid and Prilosec   ------------------------------------------------------------------------------------------- ABOUT ESOPHAGEAL MANOMETRY Esophageal manometry (muh-NOM-uh-tree) is a test that gauges how well your esophagus works. Your esophagus is the long, muscular tube that connects your throat to your stomach. Esophageal manometry measures the rhythmic muscle contractions (peristalsis) that occur in your esophagus when you swallow. Esophageal manometry also measures the coordination and force exerted by the muscles of your esophagus.  During esophageal manometry, a thin, flexible tube (catheter) that contains sensors is passed through your nose, down your esophagus and into your stomach. Esophageal manometry can be helpful in diagnosing some mostly uncommon disorders that affect your esophagus.  Why it's done Esophageal manometry is used to evaluate the movement (motility) of food through the esophagus and into the stomach. The test measures how well the circular bands of muscle (sphincters) at the top and bottom of your esophagus open and close, as well as the pressure, strength and pattern of the wave of esophageal muscle contractions that  moves food along.  What you can expect Esophageal manometry is an outpatient procedure done without sedation. Most people tolerate it well. You may be asked to change into a hospital gown before the test starts.  During esophageal manometry  . While you are sitting up, a  member of your health care team sprays your throat with a numbing medication or puts numbing gel in your nose or both.  . A catheter is guided through your nose into your esophagus. The catheter may be sheathed in a water-filled sleeve. It doesn't interfere with your breathing. However, your eyes may water, and you may gag. You may have a slight nosebleed from irritation.  . After the catheter is in place, you may be asked to lie on your back on an exam table, or you may be asked to remain seated.  . You then swallow small sips of water. As you do, a computer connected to the catheter records the pressure, strength and pattern of your esophageal muscle contractions.  . During the test, you'll be asked to breathe slowly and smoothly, remain as still as possible, and swallow only when you're asked to do so.  . A member of your health care team may move the catheter down into your stomach while the catheter continues its measurements.  . The catheter then is slowly withdrawn.  This test typically takes 30-45 minutes to complete.  ---------------------------------------------------------------------------------------------- ABOUT 24 HOUR PH PROBE An esophageal pH test measures and records the pH in your esophagus to determine if you have gastroesophageal reflux disease (GERD). The test can also be done to determine the effectiveness of medications or surgical treatment for GERD. What is esophageal reflux? Esophageal reflux is a condition in which stomach acid refluxes or moves back into the esophagus (the "food pipe" leading from the mouth to the stomach). How does the esophageal pH test work? A thin, small tube with an acid sensing device on the tip is gently passed through your nose, down the esophagus ("food tube"), and positioned about 2 inches above the lower esophageal sphincter. The tube is secured to the side of your face with clear tape. The end of the tube exiting from your nose is attached to  a portable recorder that is worn on your belt or over your shoulder. The recorder has several buttons on it that you will press to mark certain events. A nurse will review the monitoring instructions with you. Once the test has begun, what do I need to know and do? Marland Kitchen Activity: Follow your usual daily routine. Do not reduce or change your activities during the monitoring period. Doing so can make the monitoring results less useful.  . Note: do not take a tub bath or shower; the equipment can't get wet.  . Eating: Eat your regular meals at the usual times. If you do not eat during the monitoring period, your stomach will not produce acid as usual, and the test results will not be accurate. Eat at least 2 meals a day. Eat foods that tend to increase your symptoms (without making yourself miserable). Avoid snacking. Do not suck on hard candy or lozenges and do not chew gum during the monitoring period.  . Lying down: Remain upright throughout the day. Do not lie down until you go to bed (unless napping or lying down during the day is part of your daily routine).  . Medications: Continue to follow your doctor's advice regarding medications to avoid during the monitoring period.  . Recording  symptoms: Press the appropriate button on your recorder when symptoms occur (as discussed with the nurse).  . Recording events: Record the time you start and stop eating and drinking (anything other than plain water). Record the time you lie down (even if just resting) and when you get back up. The nurse will explain this.  . Unusual symptoms or side effects. If you think you may be experiencing any unusual symptoms or side effects, call your doctor.  You will return the next day to have the tube removed. The information on the recorder will be downloaded to a computer and the results will be analyzed.  After completion of the study Resume your normal diet and medications. Lozenges or hard candy may help ease any sore  throat caused by the tube.   It will take at least 2 weeks to receive the results of this test from your physician.

## 2020-07-15 NOTE — H&P (View-Only) (Signed)
Referring Provider: Isaac Bliss, [place]* Primary Care Physician:  Isaac Bliss, Rayford Halsted, MD  Reason for Consultation:  GERD   IMPRESSION:  Globus and biopsy-proven reflux esophagitis not responding to maximal medical therapy    -ongoing symptoms despite PPI twice daily and H2 blocker twice daily    -given the associated neck and ear pain evaluation by ENT is indicated    -consider upper GI series 24-hour pH probe/Bravo      -there may be a component of functional heartburn History of colon polyps    - 4 tubular adenomas removed 05/23/19    - surveillance recommended 2024  Gastric bypass 2004 Cholecystectomy for symptomatic cholelithiasis History of post ERCP pancreatitis 1996   PLAN: - Continue PPI BID, H2B BID, Carafate 1g slurry QID to improve symptomatic relief - Add venlafaxine 75 mg QHS as a pain modulator       - reviewed common side effects including nausea, insomnia, dizziness, drowsiness - 24 hour pH impedence and esophageal manometry testing on medications - Follow-up in the office after the study  Please see the "Patient Instructions" section for addition details about the plan.  I spent 30 minutes, including in depth chart review, face-to-face time with the patient, coordinating care, and ordering studies and medications as appropriate, and documentation.  HPI: Jaime Fuller is a 70 y.o. female who called earlier this week with ongoing reflux symptoms despite therapy.  She has a history of distant cholecystectomy for symptomatic cholelithiasis, post-ERCP pancreatitis 1996, gastric bypass 2004, and evaluated by Dr. Olevia Perches in 2006 for anemia.  She also has generalized anxiety disorder, chronic back pain, hypothyroidism, and a history of pernicious anemia.  Symptoms date back to time of colonoscopy 05/2019. Had the Covid vaccine soon thereafter with resulting palpable lymph nodes. Lymph notes became smaller, but she was left with an ongoing sensation of globus  in her neck/throat.  Associated dysphonia, frequent coughing. Sore when she touches her sternal notch with the sensation that she is choking. Was occasionally experiencing neck pain that radiates to her ears and dysphonia but now feels like her neck is on fire with worsening bilateral ear pain. No heartburn, regurgitation, brash. No anemia, dysphagia, early satiety, hematemesis, hematochezia, melena, odynophagia, or weight loss. Noted temporary improvement after steroid injection.  Now having considerable reflux despite PPI BID, H2B BID, Carafate PRN and multiple TUMS daily with indigestion, chest pain when lying down, ongoing ear pain, and globus. Nocturnal symptoms are severe enough that she is sleeping in a chair, despite not going to bed for 5 hours after eating. She switched back from Prevacid to omeprazole but doesn't find it helpful.  Despite sleeping in a chair, she is not sleeping. She is exhausted due to the poor sleep and overwhelmed by the symptoms.   Symptoms not improved despite: - PPI BID (pantoprazole, then omeprazole which causes flatus, then lansoprazole) - Famotidine 20 mg BID - Removing carbonated beverages from the diet - Discontinuation of Celebrex  Recent evaluation includes: - EGD 12/21 showed biopsy-proven reflux esophagitis, evidence for prior gastric bypass, and hyperplastic gastric polyp - Thyroid ultrasound showed a 1.6cm right nodule. Biopsy was indeterminate. Nodule thought to be too small to explain her symptoms.   Evaluation by Dr. Lucia Gaskins with ENT 06/10/20 for right thyroid nodule, neck pain, sore throat and ear burning. Notes reviewed. Ultrasound of the thyroid demonstrated a couple of nodules that were not suspicious however the patient did have a 1.6 cm right mid posterior nodule that warranted  fine-needle aspirate which has been performed and demonstrated atypia of undetermined significance Bethesda category III.Marland Kitchen Afirma testing was unable to be performed because of  low follicular content. He thought her sore throat was likely reflux and her burning sensation in her ears a neuropathic pain. He also wondered if her symptoms could be related to stress. They discussed thyroid lobectomy versus monitoring with an ultrasound in July.    Prior endoscopic history: - Screening colonoscopy with Dr. Olevia Perches 05/05/2004: normal - EGD with Dr. Olevia Perches 10/18/2004 to evaluate anemia, and chest pain:GE junction at 35 cm, gastric bypass anastomosis present, otherwise normal.  Biopsies of the small bowel were normal. - Colonoscopy 05/23/19: left sided diverticulosis, 4 tubular adenomas - EGD 12/21 showed biopsy-proven reflux esophagitis, evidence for prior gastric bypass, and hyperplastic gastric polyp   Past Medical History:  Diagnosis Date  . Anxiety state, unspecified 09/20/2007  . Arthritis   . Cataract   . Chronic kidney disease    kidney stones  . HYPOTHYROIDISM 03/08/2007   no meds needed now 04-15-19  . Pancreatitis    secondary to ERCP    Past Surgical History:  Procedure Laterality Date  . abdominal plasty    . Carpel tunnel surgery left hand    . Childbirth x 2    . CHOLECYSTECTOMY    . GASTRIC BYPASS    . UPPER GASTROINTESTINAL ENDOSCOPY    . WISDOM TOOTH EXTRACTION      Current Outpatient Medications  Medication Sig Dispense Refill  . clonazePAM (KLONOPIN) 0.5 MG tablet Take 1 tablet (0.5 mg total) by mouth 2 (two) times daily as needed for anxiety. 30 tablet 1  . EPINEPHrine 0.3 mg/0.3 mL IJ SOAJ injection Inject 0.3 mLs (0.3 mg total) into the muscle as needed for anaphylaxis. 1 each 1  . famotidine (PEPCID) 20 MG tablet Take 1 tablet (20 mg total) by mouth 2 (two) times daily. 180 tablet 3  . lansoprazole (PREVACID) 30 MG capsule Take 1 by mouth 30-60 min before breakfast 30 capsule 3  . levothyroxine (SYNTHROID) 50 MCG tablet Take 1 tablet (50 mcg total) by mouth daily. 90 tablet 3  . omeprazole (PRILOSEC) 40 MG capsule Take 1 capsule (40 mg total)  by mouth in the morning and at bedtime. 60 capsule 0  . sucralfate (CARAFATE) 1 GM/10ML suspension Take 10 mLs (1 g total) by mouth 4 (four) times daily. 420 mL 1  . valACYclovir (VALTREX) 1000 MG tablet ONE TABLET TWICE A DAY X 5 DAYS AS NEEDED FOR OUTBREAK 50 tablet 1   No current facility-administered medications for this visit.    Allergies as of 07/15/2020 - Review Complete 06/10/2020  Allergen Reaction Noted  . Vicodin [hydrocodone-acetaminophen] Itching 07/07/2010  . Bee venom  04/15/2019    Family History  Problem Relation Age of Onset  . Thyroid disease Mother   . Lung cancer Father   . Heart attack Father        Died at 71  . Colon cancer Neg Hx   . Esophageal cancer Neg Hx   . Rectal cancer Neg Hx   . Stomach cancer Neg Hx       Physical Exam: General:   Alert,  well-nourished, pleasant and cooperative in NAD Head:  Normocephalic and atraumatic. Eyes:  Sclera clear, no icterus.   Conjunctiva pink. Abdomen:  Soft , nontender, nondistended, normal bowel sounds, no rebound or guarding. No hepatosplenomegaly.   Neurologic:  Alert and  oriented x4;  grossly nonfocal Skin:  Intact without significant lesions or rashes. Psych:  Alert and cooperative. Normal mood and affect.    Guillermo Difrancesco L. Tarri Glenn, MD, MPH 07/15/2020, 8:48 AM

## 2020-07-15 NOTE — Progress Notes (Signed)
 Referring Provider: Hernandez Acosta, Estel* Primary Care Physician:  Hernandez Acosta, Estela Y, MD  Reason for Consultation:  GERD   IMPRESSION:  Globus and biopsy-proven reflux esophagitis not responding to maximal medical therapy    -ongoing symptoms despite PPI twice daily and H2 blocker twice daily    -given the associated neck and ear pain evaluation by ENT is indicated    -consider upper GI series 24-hour pH probe/Bravo      -there may be a component of functional heartburn History of colon polyps    - 4 tubular adenomas removed 05/23/19    - surveillance recommended 2024  Gastric bypass 2004 Cholecystectomy for symptomatic cholelithiasis History of post ERCP pancreatitis 1996   PLAN: - Continue PPI BID, H2B BID, Carafate 1g slurry QID to improve symptomatic relief - Add venlafaxine 75 mg QHS as a pain modulator       - reviewed common side effects including nausea, insomnia, dizziness, drowsiness - 24 hour pH impedence and esophageal manometry testing on medications - Follow-up in the office after the study  Please see the "Patient Instructions" section for addition details about the plan.  I spent 30 minutes, including in depth chart review, face-to-face time with the patient, coordinating care, and ordering studies and medications as appropriate, and documentation.  HPI: Jaime Fuller is a 70 y.o. female who called earlier this week with ongoing reflux symptoms despite therapy.  She has a history of distant cholecystectomy for symptomatic cholelithiasis, post-ERCP pancreatitis 1996, gastric bypass 2004, and evaluated by Dr. Brodie in 2006 for anemia.  She also has generalized anxiety disorder, chronic back pain, hypothyroidism, and a history of pernicious anemia.  Symptoms date back to time of colonoscopy 05/2019. Had the Covid vaccine soon thereafter with resulting palpable lymph nodes. Lymph notes became smaller, but she was left with an ongoing sensation of globus  in her neck/throat.  Associated dysphonia, frequent coughing. Sore when she touches her sternal notch with the sensation that she is choking. Was occasionally experiencing neck pain that radiates to her ears and dysphonia but now feels like her neck is on fire with worsening bilateral ear pain. No heartburn, regurgitation, brash. No anemia, dysphagia, early satiety, hematemesis, hematochezia, melena, odynophagia, or weight loss. Noted temporary improvement after steroid injection.  Now having considerable reflux despite PPI BID, H2B BID, Carafate PRN and multiple TUMS daily with indigestion, chest pain when lying down, ongoing ear pain, and globus. Nocturnal symptoms are severe enough that she is sleeping in a chair, despite not going to bed for 5 hours after eating. She switched back from Prevacid to omeprazole but doesn't find it helpful.  Despite sleeping in a chair, she is not sleeping. She is exhausted due to the poor sleep and overwhelmed by the symptoms.   Symptoms not improved despite: - PPI BID (pantoprazole, then omeprazole which causes flatus, then lansoprazole) - Famotidine 20 mg BID - Removing carbonated beverages from the diet - Discontinuation of Celebrex  Recent evaluation includes: - EGD 12/21 showed biopsy-proven reflux esophagitis, evidence for prior gastric bypass, and hyperplastic gastric polyp - Thyroid ultrasound showed a 1.6cm right nodule. Biopsy was indeterminate. Nodule thought to be too small to explain her symptoms.   Evaluation by Dr. Newman with ENT 06/10/20 for right thyroid nodule, neck pain, sore throat and ear burning. Notes reviewed. Ultrasound of the thyroid demonstrated a couple of nodules that were not suspicious however the patient did have a 1.6 cm right mid posterior nodule that warranted   fine-needle aspirate which has been performed and demonstrated atypia of undetermined significance Bethesda category III.Marland Kitchen Afirma testing was unable to be performed because of  low follicular content. He thought her sore throat was likely reflux and her burning sensation in her ears a neuropathic pain. He also wondered if her symptoms could be related to stress. They discussed thyroid lobectomy versus monitoring with an ultrasound in July.    Prior endoscopic history: - Screening colonoscopy with Dr. Olevia Perches 05/05/2004: normal - EGD with Dr. Olevia Perches 10/18/2004 to evaluate anemia, and chest pain:GE junction at 35 cm, gastric bypass anastomosis present, otherwise normal.  Biopsies of the small bowel were normal. - Colonoscopy 05/23/19: left sided diverticulosis, 4 tubular adenomas - EGD 12/21 showed biopsy-proven reflux esophagitis, evidence for prior gastric bypass, and hyperplastic gastric polyp   Past Medical History:  Diagnosis Date  . Anxiety state, unspecified 09/20/2007  . Arthritis   . Cataract   . Chronic kidney disease    kidney stones  . HYPOTHYROIDISM 03/08/2007   no meds needed now 04-15-19  . Pancreatitis    secondary to ERCP    Past Surgical History:  Procedure Laterality Date  . abdominal plasty    . Carpel tunnel surgery left hand    . Childbirth x 2    . CHOLECYSTECTOMY    . GASTRIC BYPASS    . UPPER GASTROINTESTINAL ENDOSCOPY    . WISDOM TOOTH EXTRACTION      Current Outpatient Medications  Medication Sig Dispense Refill  . clonazePAM (KLONOPIN) 0.5 MG tablet Take 1 tablet (0.5 mg total) by mouth 2 (two) times daily as needed for anxiety. 30 tablet 1  . EPINEPHrine 0.3 mg/0.3 mL IJ SOAJ injection Inject 0.3 mLs (0.3 mg total) into the muscle as needed for anaphylaxis. 1 each 1  . famotidine (PEPCID) 20 MG tablet Take 1 tablet (20 mg total) by mouth 2 (two) times daily. 180 tablet 3  . lansoprazole (PREVACID) 30 MG capsule Take 1 by mouth 30-60 min before breakfast 30 capsule 3  . levothyroxine (SYNTHROID) 50 MCG tablet Take 1 tablet (50 mcg total) by mouth daily. 90 tablet 3  . omeprazole (PRILOSEC) 40 MG capsule Take 1 capsule (40 mg total)  by mouth in the morning and at bedtime. 60 capsule 0  . sucralfate (CARAFATE) 1 GM/10ML suspension Take 10 mLs (1 g total) by mouth 4 (four) times daily. 420 mL 1  . valACYclovir (VALTREX) 1000 MG tablet ONE TABLET TWICE A DAY X 5 DAYS AS NEEDED FOR OUTBREAK 50 tablet 1   No current facility-administered medications for this visit.    Allergies as of 07/15/2020 - Review Complete 06/10/2020  Allergen Reaction Noted  . Vicodin [hydrocodone-acetaminophen] Itching 07/07/2010  . Bee venom  04/15/2019    Family History  Problem Relation Age of Onset  . Thyroid disease Mother   . Lung cancer Father   . Heart attack Father        Died at 71  . Colon cancer Neg Hx   . Esophageal cancer Neg Hx   . Rectal cancer Neg Hx   . Stomach cancer Neg Hx       Physical Exam: General:   Alert,  well-nourished, pleasant and cooperative in NAD Head:  Normocephalic and atraumatic. Eyes:  Sclera clear, no icterus.   Conjunctiva pink. Abdomen:  Soft , nontender, nondistended, normal bowel sounds, no rebound or guarding. No hepatosplenomegaly.   Neurologic:  Alert and  oriented x4;  grossly nonfocal Skin:  Intact without significant lesions or rashes. Psych:  Alert and cooperative. Normal mood and affect.    Tiaunna Buford L. Jaretzy Lhommedieu, MD, MPH 07/15/2020, 8:48 AM     

## 2020-07-16 ENCOUNTER — Other Ambulatory Visit: Payer: Self-pay

## 2020-07-16 ENCOUNTER — Telehealth: Payer: Self-pay | Admitting: Gastroenterology

## 2020-07-16 DIAGNOSIS — R198 Other specified symptoms and signs involving the digestive system and abdomen: Secondary | ICD-10-CM

## 2020-07-16 DIAGNOSIS — K219 Gastro-esophageal reflux disease without esophagitis: Secondary | ICD-10-CM

## 2020-07-16 DIAGNOSIS — R0989 Other specified symptoms and signs involving the circulatory and respiratory systems: Secondary | ICD-10-CM

## 2020-07-16 MED ORDER — VENLAFAXINE HCL ER 75 MG PO CP24: 75.0000 mg | ORAL_CAPSULE | Freq: Every day | ORAL | 2 refills | Status: DC

## 2020-07-16 MED ORDER — FAMOTIDINE 20 MG PO TABS: 20.0000 mg | ORAL_TABLET | Freq: Two times a day (BID) | ORAL | 3 refills | Status: DC

## 2020-07-16 MED ORDER — OMEPRAZOLE 40 MG PO CPDR: 40.0000 mg | DELAYED_RELEASE_CAPSULE | Freq: Two times a day (BID) | ORAL | 0 refills | Status: DC

## 2020-07-16 NOTE — Telephone Encounter (Signed)
Inbound call from patient. Asking if the prescriptions from 5/5 can be sent to [facility] on [address] instead of [facility]. Asked to cancel with [facility]

## 2020-07-16 NOTE — Telephone Encounter (Signed)
Outpatient Medication Detail   Disp Refills Start End   famotidine (PEPCID) 20 MG tablet 180 tablet 3 07/16/2020    Sig - Route: Take 1 tablet (20 mg total) by mouth 2 (two) times daily. - Oral   Sent to pharmacy as: famotidine (PEPCID) 20 MG tablet   E-Prescribing Status: Receipt confirmed by pharmacy (07/16/2020  2:46 PM EDT)    Outpatient Medication Detail   Disp Refills Start End   omeprazole (PRILOSEC) 40 MG capsule 60 capsule 0 07/16/2020    Sig - Route: Take 1 capsule (40 mg total) by mouth in the morning and at bedtime. - Oral   Sent to pharmacy as: omeprazole (PRILOSEC) 40 MG capsule   E-Prescribing Status: Receipt confirmed by pharmacy (07/16/2020  2:46 PM EDT)    Outpatient Medication Detail   Disp Refills Start End   venlafaxine XR (EFFEXOR XR) 75 MG 24 hr capsule 30 capsule 2 07/16/2020    Sig - Route: Take 1 capsule (75 mg total) by mouth at bedtime. - Oral   Sent to pharmacy as: venlafaxine XR (EFFEXOR XR) 75 MG 24 hr capsule   E-Prescribing Status: Receipt confirmed by pharmacy (07/16/2020  2:46 PM EDT)    Pharmacy  [facility] [phone] - [place], [place] - [phone]  [place], [place] [address]  Phone:  [phone]  Fax:  [phone]

## 2020-07-19 DIAGNOSIS — M4726 Other spondylosis with radiculopathy, lumbar region: Secondary | ICD-10-CM | POA: Diagnosis not present

## 2020-07-19 DIAGNOSIS — M549 Dorsalgia, unspecified: Secondary | ICD-10-CM | POA: Diagnosis not present

## 2020-07-19 DIAGNOSIS — R221 Localized swelling, mass and lump, neck: Secondary | ICD-10-CM | POA: Diagnosis not present

## 2020-07-22 ENCOUNTER — Other Ambulatory Visit (HOSPITAL_COMMUNITY)
Admission: RE | Admit: 2020-07-22 | Discharge: 2020-07-22 | Disposition: A | Discharge status: Home / Self Care | Payer: Medicare Other | Source: Ambulatory Visit | Attending: Gastroenterology | Admitting: Gastroenterology

## 2020-07-22 DIAGNOSIS — Z01812 Encounter for preprocedural laboratory examination: Secondary | ICD-10-CM | POA: Insufficient documentation

## 2020-07-22 DIAGNOSIS — Z20822 Contact with and (suspected) exposure to covid-19: Secondary | ICD-10-CM | POA: Diagnosis not present

## 2020-07-22 LAB — SARS CORONAVIRUS 2 (TAT 6-24 HRS): SARS Coronavirus 2: NEGATIVE

## 2020-07-26 ENCOUNTER — Encounter (HOSPITAL_COMMUNITY)
Admission: RE | Disposition: A | Discharge status: Home / Self Care | Payer: Self-pay | Source: Home / Self Care | Attending: Gastroenterology

## 2020-07-26 ENCOUNTER — Ambulatory Visit (HOSPITAL_COMMUNITY)
Admission: RE | Admit: 2020-07-26 | Discharge: 2020-07-26 | Disposition: A | Discharge status: Home / Self Care | Payer: Medicare Other | Attending: Gastroenterology | Admitting: Gastroenterology

## 2020-07-26 ENCOUNTER — Encounter (HOSPITAL_COMMUNITY): Payer: Self-pay | Admitting: Gastroenterology

## 2020-07-26 SURGERY — INVASIVE LAB ABORTED CASE

## 2020-07-26 MED ORDER — LIDOCAINE VISCOUS HCL 2 % MT SOLN
OROMUCOSAL | Status: AC
  Filled 2020-07-26: qty 15

## 2020-07-26 SURGICAL SUPPLY — 2 items
FACESHIELD LNG OPTICON STERILE (SAFETY) IMPLANT
GLOVE BIO SURGEON STRL SZ8 (GLOVE) ×6 IMPLANT

## 2020-07-26 NOTE — Interval H&P Note (Signed)
History and Physical Interval Note:  07/26/2020 10:29 AM  Jaime Fuller  has presented today for surgery, with the diagnosis of GERD.  The various methods of treatment have been discussed with the patient and family. After consideration of risks, benefits and other options for treatment, the patient has consented to  Procedure(s): 24 HOUR PH STUDY (N/A) ESOPHAGEAL MANOMETRY (EM) (N/A) as a surgical intervention.  The patient's history has been reviewed, patient examined, no change in status, stable for surgery.  I have reviewed the patient's chart and labs.  Questions were answered to the patient's satisfaction.     Thornton Park

## 2020-07-26 NOTE — Progress Notes (Signed)
Esophageal manometry attempted.  Manometry probe was placed without complications.  Once patient was reclined, she started complaining of pain in throat and felt the need to vomit.  After sitting up patient stated she could not tolerate probe, it was "too painful".  Patient requested to have probe removed and did not want to proceed with study.  Notified Dr. Tarri Glenn and Dr. Silverio Decamp.

## 2020-07-27 ENCOUNTER — Telehealth: Payer: Self-pay | Admitting: Gastroenterology

## 2020-07-27 NOTE — Telephone Encounter (Signed)
She could try the Effexor at half the dose to see if it makes her less sleepy. Will consider a tricyclic antidepressant at doses for neuromodulation (like the Effexor). Please obtain EKG to insure she is a candidate before I can prescribe it.  Thanks.  KLB

## 2020-07-27 NOTE — Telephone Encounter (Signed)
Dr. Tarri Glenn, please see note below and advise. Thanks

## 2020-07-27 NOTE — Telephone Encounter (Signed)
Inbound call from patient. States she just received and taking Effexor. She took her first dose Thursday night at bedtime. States it made her sleep all day Friday 5/13 and Saturday 5/14. Couldn't drive or function. If she sat down she would fall asleep. She states she felt herself coming off on Sunday morning 5/15 because she was more aware. Patient asking if there can be something different prescribed.   Esothageal manometry was not completed at [facility]. Patient stated having spasms in esophagus so bad had to take tube out. States her throat is hurting bad when swallow.   Best contact number [phone]

## 2020-07-28 MED ORDER — VENLAFAXINE HCL ER 37.5 MG PO CP24: 37.5000 mg | ORAL_CAPSULE | Freq: Every day | ORAL | 0 refills | Status: DC

## 2020-07-28 NOTE — Telephone Encounter (Signed)
Lm on vm for patient to return call 

## 2020-07-28 NOTE — Telephone Encounter (Signed)
Spoke with patient in regards to recommendations. New prescription for lower dose of Effexor sent to patient's pharmacy. Advised patient to try lower dose for a few nights to see if that helps and if not to give [place] a call back. Advised that if lower dose Effexor does not work we will need to get her scheduled for an EKG prior to starting new medication. Patient is aware that we will call with further recommendations once Dr. Tarri Glenn has a change to review the manometry report. Patient verbalized understanding and had no concerns at the end of the call.

## 2020-08-27 ENCOUNTER — Other Ambulatory Visit: Payer: Self-pay

## 2020-08-27 ENCOUNTER — Ambulatory Visit
Admission: RE | Admit: 2020-08-27 | Discharge: 2020-08-27 | Disposition: A | Discharge status: Home / Self Care | Payer: Medicare Other | Source: Ambulatory Visit | Attending: Student | Admitting: Student

## 2020-08-27 VITALS — BP 120/70 | HR 67 | Temp 98.2°F | Resp 18

## 2020-08-27 DIAGNOSIS — S61230A Puncture wound without foreign body of right index finger without damage to nail, initial encounter: Secondary | ICD-10-CM | POA: Diagnosis not present

## 2020-08-27 DIAGNOSIS — Z23 Encounter for immunization: Secondary | ICD-10-CM | POA: Diagnosis not present

## 2020-08-27 MED ORDER — AMOXICILLIN-POT CLAVULANATE 875-125 MG PO TABS: 1.0000 | ORAL_TABLET | Freq: Two times a day (BID) | ORAL | 0 refills | Status: DC

## 2020-08-27 MED ORDER — TETANUS-DIPHTH-ACELL PERTUSSIS 5-2.5-18.5 LF-MCG/0.5 IM SUSY
0.5000 mL | PREFILLED_SYRINGE | Freq: Once | INTRAMUSCULAR | Status: AC
  Administered 2020-08-27: 12:00:00 0.5 mL via INTRAMUSCULAR

## 2020-08-27 NOTE — Discharge Instructions (Addendum)
-  Start the antibiotic-Augmentin (amoxicillin-clavulanate), 1 pill every 12 hours for 7 days.  You can take this with food like with breakfast and dinner. -We administered a Tdap vaccine today -To care for your wound: Wash the wound with gentle soap and water 2x daily. Rinse the wound with water to remove all soap. Pat the wound dry with a clean towel. Do not rub the wound. After you clean the wound, put a thin layer of antibiotic ointment on. This ointment: Helps to prevent infection. Keeps the bandage from sticking to the wound. -Seek additional medical attention if new symptoms like redness and swelling surrounding the puncture site, redness and swelling of the entire finger, pain with finger movement, new fevers/chills, discharge from your wound.

## 2020-08-27 NOTE — ED Provider Notes (Signed)
[name]    CSN: [id number] Arrival date & time: 08/27/20  1046      History   Chief Complaint Chief Complaint  Patient presents with   Laceration    Right hand    HPI Jaime Fuller is a 70 y.o. female presenting with R hand injury sustained 1 day ago. Medical history CKD. Notes R index finger puncture wound following using drill. States the drill slipped, gouging the palmar side of her right index fingertip. Cleaned this thoroughly at home and states she removed all foreign material. Denies sensation changes. Denies pain with movement. Denins injury elsewhere.  HPI  Past Medical History:  Diagnosis Date   Anxiety state, unspecified 09/20/2007   Arthritis    Cataract    Chronic kidney disease    kidney stones   HYPOTHYROIDISM 03/08/2007   no meds needed now 04-15-19   Pancreatitis    secondary to ERCP    Patient Active Problem List   Diagnosis Date Noted   Neck pain 05/18/2020   Multinodular goiter 05/18/2020   Pancreatitis    Vitamin D deficiency 04/03/2019   Iron deficiency anemia 04/03/2019   Routine general medical examination at a health care facility 01/29/2017   Pernicious anemia 09/21/2011    Past Surgical History:  Procedure Laterality Date   abdominal plasty     Carpel tunnel surgery left hand     Childbirth x 2     CHOLECYSTECTOMY     GASTRIC BYPASS     UPPER GASTROINTESTINAL ENDOSCOPY     WISDOM TOOTH EXTRACTION      OB History   No obstetric history on file.      Home Medications    Prior to Admission medications   Medication Sig Start Date End Date Taking? Authorizing Provider  amoxicillin-clavulanate (AUGMENTIN) 875-125 MG tablet Take 1 tablet by mouth every 12 (twelve) hours. 08/27/20  Yes Hazel Sams, PA-C  calcium carbonate (TUMS EX) 750 MG chewable tablet Chew 1-3 tablets by mouth as needed for heartburn.    [provider]  clonazePAM (KLONOPIN) 0.5 MG tablet Take 1 tablet (0.5 mg total) by mouth 2  (two) times daily as needed for anxiety. 01/06/20   Isaac Bliss, Rayford Halsted, MD  EPINEPHrine 0.3 mg/0.3 mL IJ SOAJ injection Inject 0.3 mLs (0.3 mg total) into the muscle as needed for anaphylaxis. Patient not taking: Reported on 07/15/2020 04/03/19   Isaac Bliss, Rayford Halsted, MD  famotidine (PEPCID) 20 MG tablet Take 1 tablet (20 mg total) by mouth 2 (two) times daily. 07/16/20   Thornton Park, MD  levothyroxine (SYNTHROID) 50 MCG tablet Take 1 tablet (50 mcg total) by mouth daily. 01/08/20   Isaac Bliss, Rayford Halsted, MD  omeprazole (PRILOSEC) 40 MG capsule Take 1 capsule (40 mg total) by mouth in the morning and at bedtime. 07/16/20   Thornton Park, MD  sucralfate (CARAFATE) 1 GM/10ML suspension Take 10 mLs (1 g total) by mouth 4 (four) times daily. 07/15/20   Thornton Park, MD  valACYclovir (VALTREX) 1000 MG tablet ONE TABLET TWICE A DAY X 5 DAYS AS NEEDED FOR OUTBREAK 05/27/19   Isaac Bliss, Rayford Halsted, MD  venlafaxine XR (EFFEXOR XR) 37.5 MG 24 hr capsule Take 1 capsule (37.5 mg total) by mouth at bedtime. 07/28/20   Thornton Park, MD    Family History Family History  Problem Relation Age of Onset   Thyroid disease Mother    Lung cancer Father    Heart attack Father  Died at 36   Colon cancer Neg Hx    Esophageal cancer Neg Hx    Rectal cancer Neg Hx    Stomach cancer Neg Hx     Social History Social History   Tobacco Use   Smoking status: Former    Pack years: 0.00   Smokeless tobacco: Never  Vaping Use   Vaping Use: Never used  Substance Use Topics   Alcohol use: No    Alcohol/week: 0.0 standard drinks   Drug use: No     Allergies   Vicodin [hydrocodone-acetaminophen] and Bee venom   Review of Systems Review of Systems  Skin:  Positive for wound.  All other systems reviewed and are negative.   Physical Exam Triage Vital Signs ED Triage Vitals  Enc Vitals Group     BP      Pulse      Resp      Temp      Temp src      SpO2       Weight      Height      Head Circumference      Peak Flow      Pain Score      Pain Loc      Pain Edu?      Excl. in [place]?    No data found.  Updated Vital Signs BP 120/70 (BP Location: Left Arm)   Pulse 67   Temp 98.2 F (36.8 C) (Oral)   Resp 18   SpO2 96%   Visual Acuity Right Eye Distance:   Left Eye Distance:   Bilateral Distance:    Right Eye Near:   Left Eye Near:    Bilateral Near:     Physical Exam Vitals reviewed.  Constitutional:      General: She is not in acute distress.    Appearance: Normal appearance. She is not ill-appearing or diaphoretic.  HENT:     Head: Normocephalic and atraumatic.  Cardiovascular:     Rate and Rhythm: Normal rate and regular rhythm.     Heart sounds: Normal heart sounds.  Pulmonary:     Effort: Pulmonary effort is normal.     Breath sounds: Normal breath sounds.  Skin:    General: Skin is warm.     Comments: See image below R index finger, palmar aspect, distal phalanx- with 81mm puncture wound, healing well. Not actively bleeding, no warmth erythema or discharge. Sensation intact. Cap refill <2 seconds. ROM DIP and PIP intact and without pain. Radial pulse 2+. Grip strength 5/5.   Neurological:     General: No focal deficit present.     Mental Status: She is alert and oriented to person, place, and time.  Psychiatric:        Mood and Affect: Mood normal.        Behavior: Behavior normal.        Thought Content: Thought content normal.        Judgment: Judgment normal.       UC Treatments / Results  Labs (all labs ordered are listed, but only abnormal results are displayed) Labs Reviewed - No data to display  EKG   Radiology No results found.  Procedures Procedures (including critical care time)  Medications Ordered in UC Medications  Tdap (BOOSTRIX) injection 0.5 mL (0.5 mLs Intramuscular Given 08/27/20 1151)    Initial Impression / Assessment and Plan / UC Course  I have reviewed the triage vital signs  and  the nursing notes.  Pertinent labs & imaging results that were available during my care of the patient were reviewed by me and considered in my medical decision making (see chart for details).     This patient is a 70 year old female presenting with puncture wound R index finger. Appears to be healing well on its own.  Pt with CKD. Last creatinine in normal range one year ago. Augmentin as below.   Wound care provided and wound care instructions discussed.   Tdap not UTD - administered today.  ED return precautions discussed. Patient verbalizes understanding and agreement.    Final Clinical Impressions(s) / UC Diagnoses   Final diagnoses:  Puncture wound of right index finger  Need for Tdap vaccination     Discharge Instructions      -Start the antibiotic-Augmentin (amoxicillin-clavulanate), 1 pill every 12 hours for 7 days.  You can take this with food like with breakfast and dinner. -We administered a Tdap vaccine today -To care for your wound: Wash the wound with gentle soap and water 2x daily. Rinse the wound with water to remove all soap. Pat the wound dry with a clean towel. Do not rub the wound. After you clean the wound, put a thin layer of antibiotic ointment on. This ointment: Helps to prevent infection. Keeps the bandage from sticking to the wound. -Seek additional medical attention if new symptoms like redness and swelling surrounding the puncture site, redness and swelling of the entire finger, pain with finger movement, new fevers/chills, discharge from your wound.       ED Prescriptions     Medication Sig Dispense Auth. Provider   amoxicillin-clavulanate (AUGMENTIN) 875-125 MG tablet Take 1 tablet by mouth every 12 (twelve) hours. 14 tablet Hazel Sams, PA-C      PDMP not reviewed this encounter.   Hazel Sams, PA-C 08/27/20 1221

## 2020-08-27 NOTE — ED Triage Notes (Signed)
Pt present a wound to her right hand/index finger from a drill. Pt needs a tdap injection.

## 2020-09-01 ENCOUNTER — Other Ambulatory Visit: Payer: Self-pay | Admitting: *Deleted

## 2020-09-01 MED ORDER — LEVOTHYROXINE SODIUM 50 MCG PO TABS: 50.0000 ug | ORAL_TABLET | Freq: Every day | ORAL | 1 refills | Status: DC

## 2020-09-14 ENCOUNTER — Ambulatory Visit
Admission: RE | Admit: 2020-09-14 | Discharge: 2020-09-14 | Disposition: A | Discharge status: Home / Self Care | Payer: Medicare Other | Source: Ambulatory Visit | Attending: Endocrinology | Admitting: Endocrinology

## 2020-09-14 ENCOUNTER — Other Ambulatory Visit: Payer: Self-pay

## 2020-09-14 DIAGNOSIS — E042 Nontoxic multinodular goiter: Secondary | ICD-10-CM

## 2020-09-14 DIAGNOSIS — E041 Nontoxic single thyroid nodule: Secondary | ICD-10-CM | POA: Diagnosis not present

## 2020-09-17 ENCOUNTER — Encounter: Payer: Self-pay | Admitting: Emergency Medicine

## 2020-09-17 ENCOUNTER — Telehealth: Payer: Self-pay | Admitting: Internal Medicine

## 2020-09-17 ENCOUNTER — Emergency Department (HOSPITAL_COMMUNITY): Payer: Medicare Other

## 2020-09-17 ENCOUNTER — Emergency Department (HOSPITAL_COMMUNITY)
Admission: EM | Admit: 2020-09-17 | Discharge: 2020-09-17 | Disposition: A | Discharge status: Home / Self Care | Payer: Medicare Other | Attending: Emergency Medicine | Admitting: Emergency Medicine

## 2020-09-17 ENCOUNTER — Other Ambulatory Visit: Payer: Self-pay

## 2020-09-17 ENCOUNTER — Encounter (HOSPITAL_COMMUNITY): Payer: Self-pay

## 2020-09-17 ENCOUNTER — Ambulatory Visit
Admission: EM | Admit: 2020-09-17 | Discharge: 2020-09-17 | Disposition: A | Discharge status: Home / Self Care | Payer: Medicare Other

## 2020-09-17 DIAGNOSIS — R0902 Hypoxemia: Secondary | ICD-10-CM | POA: Diagnosis not present

## 2020-09-17 DIAGNOSIS — E039 Hypothyroidism, unspecified: Secondary | ICD-10-CM | POA: Diagnosis not present

## 2020-09-17 DIAGNOSIS — I498 Other specified cardiac arrhythmias: Secondary | ICD-10-CM | POA: Diagnosis not present

## 2020-09-17 DIAGNOSIS — N189 Chronic kidney disease, unspecified: Secondary | ICD-10-CM | POA: Diagnosis not present

## 2020-09-17 DIAGNOSIS — Z87891 Personal history of nicotine dependence: Secondary | ICD-10-CM | POA: Insufficient documentation

## 2020-09-17 DIAGNOSIS — R001 Bradycardia, unspecified: Secondary | ICD-10-CM | POA: Diagnosis not present

## 2020-09-17 DIAGNOSIS — M7989 Other specified soft tissue disorders: Secondary | ICD-10-CM

## 2020-09-17 DIAGNOSIS — Z79899 Other long term (current) drug therapy: Secondary | ICD-10-CM | POA: Diagnosis not present

## 2020-09-17 DIAGNOSIS — R609 Edema, unspecified: Secondary | ICD-10-CM | POA: Diagnosis not present

## 2020-09-17 DIAGNOSIS — R600 Localized edema: Secondary | ICD-10-CM | POA: Insufficient documentation

## 2020-09-17 DIAGNOSIS — I493 Ventricular premature depolarization: Secondary | ICD-10-CM | POA: Diagnosis not present

## 2020-09-17 DIAGNOSIS — R079 Chest pain, unspecified: Secondary | ICD-10-CM | POA: Diagnosis not present

## 2020-09-17 DIAGNOSIS — I491 Atrial premature depolarization: Secondary | ICD-10-CM | POA: Diagnosis not present

## 2020-09-17 DIAGNOSIS — I10 Essential (primary) hypertension: Secondary | ICD-10-CM | POA: Diagnosis not present

## 2020-09-17 LAB — CBC WITH DIFFERENTIAL/PLATELET
Abs Immature Granulocytes: 0 10*3/uL (ref 0.00–0.07)
Basophils Absolute: 0 10*3/uL (ref 0.0–0.1)
Basophils Relative: 1 %
Eosinophils Absolute: 0.1 10*3/uL (ref 0.0–0.5)
Eosinophils Relative: 2 %
HCT: 40 % (ref 36.0–46.0)
Hemoglobin: 12.3 g/dL (ref 12.0–15.0)
Immature Granulocytes: 0 %
Lymphocytes Relative: 37 %
Lymphs Abs: 1.6 10*3/uL (ref 0.7–4.0)
MCH: 28.7 pg (ref 26.0–34.0)
MCHC: 30.8 g/dL (ref 30.0–36.0)
MCV: 93.5 fL (ref 80.0–100.0)
Monocytes Absolute: 0.4 10*3/uL (ref 0.1–1.0)
Monocytes Relative: 8 %
Neutro Abs: 2.4 10*3/uL (ref 1.7–7.7)
Neutrophils Relative %: 52 %
Platelets: 219 10*3/uL (ref 150–400)
RBC: 4.28 MIL/uL (ref 3.87–5.11)
RDW: 13.6 % (ref 11.5–15.5)
WBC: 4.5 10*3/uL (ref 4.0–10.5)
nRBC: 0 % (ref 0.0–0.2)

## 2020-09-17 LAB — BASIC METABOLIC PANEL
Anion gap: 8 (ref 5–15)
BUN: 11 mg/dL (ref 8–23)
CO2: 23 mmol/L (ref 22–32)
Calcium: 8.6 mg/dL — ABNORMAL LOW (ref 8.9–10.3)
Chloride: 108 mmol/L (ref 98–111)
Creatinine, Ser: 0.87 mg/dL (ref 0.44–1.00)
GFR, Estimated: >60 mL/min (ref >60)
Glucose, Bld: 88 mg/dL (ref 70–99)
Potassium: 3.8 mmol/L (ref 3.5–5.1)
Sodium: 139 mmol/L (ref 135–145)

## 2020-09-17 LAB — BRAIN NATRIURETIC PEPTIDE: B Natriuretic Peptide: 131.6 pg/mL — ABNORMAL HIGH (ref 0.0–100.0)

## 2020-09-17 LAB — PROTIME-INR
INR: 1 (ref 0.8–1.2)
Prothrombin Time: 12.8 seconds (ref 11.4–15.2)

## 2020-09-17 LAB — TROPONIN I (HIGH SENSITIVITY): Troponin I (High Sensitivity): 4 ng/L (ref <18)

## 2020-09-17 LAB — MAGNESIUM: Magnesium: 2.1 mg/dL (ref 1.7–2.4)

## 2020-09-17 NOTE — ED Provider Notes (Signed)
[name]    CSN: [id number] Arrival date & time: 09/17/20  0950      History   Chief Complaint Chief Complaint  Patient presents with   Joint Swelling   Neck Pain    HPI Jaime Fuller is a 70 y.o. female.   Presents presents to the urgent care with 3-day history of bilateral hand and feet swelling.  Having right-sided neck pain.  Had ultrasound a few weeks prior of the thyroid but not has not received results yet.  Was having throat and neck pain previously that prompted evaluation of thyroid that revealed thyroid nodules which prompted thyroid ultrasound that was completed few weeks ago.  Patient states that her neck has felt like she has "anxiety in her neck".  Denies any chest pain or shortness of breath.  Denies any previous cardiac history.  Does have chronic kidney disease.   Neck Pain  Past Medical History:  Diagnosis Date   Anxiety state, unspecified 09/20/2007   Arthritis    Cataract    Chronic kidney disease    kidney stones   HYPOTHYROIDISM 03/08/2007   no meds needed now 04-15-19   Pancreatitis    secondary to ERCP    Patient Active Problem List   Diagnosis Date Noted   Neck pain 05/18/2020   Multinodular goiter 05/18/2020   Pancreatitis    Vitamin D deficiency 04/03/2019   Iron deficiency anemia 04/03/2019   Routine general medical examination at a health care facility 01/29/2017   Pernicious anemia 09/21/2011    Past Surgical History:  Procedure Laterality Date   abdominal plasty     Carpel tunnel surgery left hand     Childbirth x 2     CHOLECYSTECTOMY     GASTRIC BYPASS     UPPER GASTROINTESTINAL ENDOSCOPY     WISDOM TOOTH EXTRACTION      OB History   No obstetric history on file.      Home Medications    Prior to Admission medications   Medication Sig Start Date End Date Taking? Authorizing Provider  amoxicillin-clavulanate (AUGMENTIN) 875-125 MG tablet Take 1 tablet by mouth every 12 (twelve) hours. 08/27/20   Hazel Sams, PA-C  calcium carbonate (TUMS EX) 750 MG chewable tablet Chew 1-3 tablets by mouth as needed for heartburn.    [provider]  clonazePAM (KLONOPIN) 0.5 MG tablet Take 1 tablet (0.5 mg total) by mouth 2 (two) times daily as needed for anxiety. 01/06/20   Isaac Bliss, Rayford Halsted, MD  EPINEPHrine 0.3 mg/0.3 mL IJ SOAJ injection Inject 0.3 mLs (0.3 mg total) into the muscle as needed for anaphylaxis. Patient not taking: Reported on 07/15/2020 04/03/19   Isaac Bliss, Rayford Halsted, MD  famotidine (PEPCID) 20 MG tablet Take 1 tablet (20 mg total) by mouth 2 (two) times daily. 07/16/20   Thorn[place] Park, MD  levothyroxine (SYNTHROID) 50 MCG tablet Take 1 tablet (50 mcg total) by mouth daily. 09/01/20   Isaac Bliss, Rayford Halsted, MD  omeprazole (PRILOSEC) 40 MG capsule Take 1 capsule (40 mg total) by mouth in the morning and at bedtime. 07/16/20   Thornton Park, MD  sucralfate (CARAFATE) 1 GM/10ML suspension Take 10 mLs (1 g total) by mouth 4 (four) times daily. 07/15/20   Thorn[place] Park, MD  valACYclovir (VALTREX) 1000 MG tablet ONE TABLET TWICE A DAY X 5 DAYS AS NEEDED FOR OUTBREAK 05/27/19   Isaac Bliss, Rayford Halsted, MD  venlafaxine XR (EFFEXOR XR) 37.5 MG 24  hr capsule Take 1 capsule (37.5 mg total) by mouth at bedtime. 07/28/20   Thornton Park, MD    Family History Family History  Problem Relation Age of Onset   Thyroid disease Mother    Lung cancer Father    Heart attack Father        Died at 27   Colon cancer Neg Hx    Esophageal cancer Neg Hx    Rectal cancer Neg Hx    Stomach cancer Neg Hx     Social History Social History   Tobacco Use   Smoking status: Former    Pack years: 0.00   Smokeless tobacco: Never  Vaping Use   Vaping Use: Never used  Substance Use Topics   Alcohol use: No    Alcohol/week: 0.0 standard drinks   Drug use: No     Allergies   Vicodin [hydrocodone-acetaminophen] and Bee venom   Review of Systems Review of Systems   Musculoskeletal:  Positive for neck pain.  Per HPI  Physical Exam Triage Vital Signs ED Triage Vitals  Enc Vitals Group     BP 09/17/20 1019 (!) 108/55     Pulse Rate 09/17/20 1019 (!) 35     Resp 09/17/20 1019 18     Temp 09/17/20 1019 98.2 F (36.8 C)     Temp Source 09/17/20 1019 Oral     SpO2 09/17/20 1019 95 %     Weight --      Height --      Head Circumference --      Peak Flow --      Pain Score 09/17/20 1016 2     Pain Loc --      Pain Edu? --      Excl. in [place]? --    No data found.  Updated Vital Signs BP (!) 108/55 (BP Location: Right Arm)   Pulse (!) 35 Comment: irregular, provider notified and at bedside. H.[place] Wins[place]  Temp 98.2 F (36.8 C) (Oral)   Resp 18   SpO2 95%   Visual Acuity Right Eye Distance:   Left Eye Distance:   Bilateral Distance:    Right Eye Near:   Left Eye Near:    Bilateral Near:     Physical Exam Constitutional:      General: She is not in acute distress.    Appearance: Normal appearance.  HENT:     Head: Normocephalic and atraumatic.  Eyes:     Extraocular Movements: Extraocular movements intact.     Conjunctiva/sclera: Conjunctivae normal.  Cardiovascular:     Rate and Rhythm: Bradycardia present. Rhythm irregular.     Pulses: Normal pulses.  Pulmonary:     Effort: Pulmonary effort is normal.  Abdominal:     General: Abdomen is flat. Bowel sounds are normal.     Palpations: Abdomen is soft.  Musculoskeletal:        General: Normal range of motion.     Cervical back: Normal range of motion. No rigidity or tenderness.  Lymphadenopathy:     Cervical: No cervical adenopathy.  Skin:    General: Skin is warm and dry.     Comments: Mild nonpitting edema to bilateral hands and feet.  Neurological:     General: No focal deficit present.     Mental Status: She is alert and oriented to person, place, and time. Mental status is at baseline.  Psychiatric:        Mood and Affect: Mood normal.  Behavior: Behavior normal.         Thought Content: Thought content normal.        Judgment: Judgment normal.  Patient was agreeable with plan.   UC Treatments / Results  Labs (all labs ordered are listed, but only abnormal results are displayed) Labs Reviewed - No data to display  EKG   Radiology No results found.  Procedures Procedures (including critical care time)  Medications Ordered in UC Medications - No data to display  Initial Impression / Assessment and Plan / UC Course  I have reviewed the triage vital signs and the nursing notes.  Pertinent labs & imaging results that were available during my care of the patient were reviewed by me and considered in my medical decision making (see chart for details).    Heart rate in 30s on original triage.  EKG completed showing sinus rhythm with frequent premature ventricular complexes in a pattern of bigeminy.  EMS was called and patient was sent to the hospital via EMS.  Patient was agreeable with plan. Final Clinical Impressions(s) / UC Diagnoses   Final diagnoses:  Bradycardia  Bigeminy     Discharge Instructions      Patient sent to the hospital via EMS.     ED Prescriptions   None    PDMP not reviewed this encounter.   Odis Luster, FNP 09/17/20 1109

## 2020-09-17 NOTE — Telephone Encounter (Signed)
Left message on machine for patient to schedule an office visit 

## 2020-09-17 NOTE — Telephone Encounter (Signed)
Patient called this morning saying that she is having discomfort in her neck and some swelling in her feet and hands. She says that she has been checking her sugar and her blood pressure and they are normal.  She also says that she had an ultrasound on Tuesday on her thyroid and she says that she has an enlarged thyroid.   Patient would like to know if something for fluid could be prescribed for her, or would like advice on what to do (sent patient to triage nurse).  Please advise.

## 2020-09-17 NOTE — Discharge Instructions (Addendum)
Patient sent to the hospital via EMS. 

## 2020-09-17 NOTE — Discharge Instructions (Addendum)
Your EKG showed frequent premature ventricular contraction.  I would recommend following up with cardiologist and primary care doctor. Keep follow-up with your endocrinologist.  Discuss your thyroid testing with them.  If you develop palpitations, any episodes of passing out, difficulty breathing, chest pain, or other new concerning symptom, please return to the emergency room for reassessment.

## 2020-09-17 NOTE — ED Provider Notes (Signed)
[facility] EMERGENCY DEPARTMENT Provider Note   CSN: [id number] Arrival date & time: 09/17/20  1133     History Chief Complaint  Patient presents with   Bradycardia    Jaime Fuller is a 70 y.o. female.  Presents to ER with concern for hand and feet swelling.  Patient reports that this has been ongoing for the last few days.  Noticed that her rings were more difficult to take on and off.  Also noted slight swelling in her feet.  No pain in hands or feet.  She denies any difficulty in breathing, has been able to perform her regular activities of daily life without any difficulty.  She denies any chest pain, no palpitations.  Went to urgent care with the symptoms, was noted to be in bigeminy and sent to ER for further eval.  HPI     Past Medical History:  Diagnosis Date   Anxiety state, unspecified 09/20/2007   Arthritis    Cataract    Chronic kidney disease    kidney stones   HYPOTHYROIDISM 03/08/2007   no meds needed now 04-15-19   Pancreatitis    secondary to ERCP    Patient Active Problem List   Diagnosis Date Noted   Neck pain 05/18/2020   Multinodular goiter 05/18/2020   Pancreatitis    Vitamin D deficiency 04/03/2019   Iron deficiency anemia 04/03/2019   Routine general medical examination at a health care facility 01/29/2017   Pernicious anemia 09/21/2011    Past Surgical History:  Procedure Laterality Date   abdominal plasty     Carpel tunnel surgery left hand     Childbirth x 2     CHOLECYSTECTOMY     GASTRIC BYPASS     UPPER GASTROINTESTINAL ENDOSCOPY     WISDOM TOOTH EXTRACTION       OB History   No obstetric history on file.     Family History  Problem Relation Age of Onset   Thyroid disease Mother    Lung cancer Father    Heart attack Father        Died at 43   Colon cancer Neg Hx    Esophageal cancer Neg Hx    Rectal cancer Neg Hx    Stomach cancer Neg Hx     Social History   Tobacco Use   Smoking status: Former     Pack years: 0.00   Smokeless tobacco: Never  Vaping Use   Vaping Use: Never used  Substance Use Topics   Alcohol use: No    Alcohol/week: 0.0 standard drinks   Drug use: No    Home Medications Prior to Admission medications   Medication Sig Start Date End Date Taking? Authorizing Provider  calcium carbonate (TUMS EX) 750 MG chewable tablet Chew 1-3 tablets by mouth as needed for heartburn.   Yes [provider]  clonazePAM (KLONOPIN) 0.5 MG tablet Take 1 tablet (0.5 mg total) by mouth 2 (two) times daily as needed for anxiety. Patient taking differently: Take 0.5 mg by mouth as needed for anxiety. 01/06/20  Yes Isaac Bliss, Rayford Halsted, MD  EPINEPHrine 0.3 mg/0.3 mL IJ SOAJ injection Inject 0.3 mLs (0.3 mg total) into the muscle as needed for anaphylaxis. 04/03/19  Yes Isaac Bliss, Rayford Halsted, MD  famotidine (PEPCID) 20 MG tablet Take 1 tablet (20 mg total) by mouth 2 (two) times daily. 07/16/20  Yes Thornton Park, MD  levothyroxine (SYNTHROID) 50 MCG tablet Take 1 tablet (50 mcg  total) by mouth daily. 09/01/20  Yes Isaac Bliss, Rayford Halsted, MD  omeprazole (PRILOSEC) 40 MG capsule Take 1 capsule (40 mg total) by mouth in the morning and at bedtime. 07/16/20  Yes Thornton Park, MD  sucralfate (CARAFATE) 1 GM/10ML suspension Take 10 mLs (1 g total) by mouth 4 (four) times daily. Patient taking differently: Take 1 g by mouth as needed (ulcers). 07/15/20  Yes Thornton Park, MD  valACYclovir (VALTREX) 1000 MG tablet ONE TABLET TWICE A DAY X 5 DAYS AS NEEDED FOR OUTBREAK Patient taking differently: Take 1,000 mg by mouth as needed (fever blister). 05/27/19  Yes Isaac Bliss, Rayford Halsted, MD  venlafaxine XR (EFFEXOR XR) 37.5 MG 24 hr capsule Take 1 capsule (37.5 mg total) by mouth at bedtime. Patient not taking: No sig reported 07/28/20   Thornton Park, MD    Allergies    Bee venom and Vicodin [hydrocodone-acetaminophen]  Review of Systems   Review of Systems   Constitutional:  Negative for chills and fever.  HENT:  Negative for ear pain and sore throat.   Eyes:  Negative for pain and visual disturbance.  Respiratory:  Negative for cough and shortness of breath.   Cardiovascular:  Negative for chest pain and palpitations.  Gastrointestinal:  Negative for abdominal pain and vomiting.  Genitourinary:  Negative for dysuria and hematuria.  Musculoskeletal:  Negative for arthralgias and back pain.  Skin:  Negative for color change and rash.  Neurological:  Negative for seizures and syncope.  All other systems reviewed and are negative.  Physical Exam Updated Vital Signs BP 113/71 (BP Location: Right Arm)   Pulse 78   Temp 98.6 F (37 C) (Oral)   Resp (!) 22   Ht 5\' 2"  (1.575 m)   Wt 82.6 kg   SpO2 100%   BMI 33.29 kg/m   Physical Exam Vitals and nursing note reviewed.  Constitutional:      General: She is not in acute distress.    Appearance: She is well-developed.  HENT:     Head: Normocephalic and atraumatic.  Eyes:     Conjunctiva/sclera: Conjunctivae normal.  Cardiovascular:     Rate and Rhythm: Normal rate and regular rhythm.     Heart sounds: No murmur heard. Pulmonary:     Effort: Pulmonary effort is normal. No respiratory distress.     Breath sounds: Normal breath sounds.  Abdominal:     Palpations: Abdomen is soft.     Tenderness: There is no abdominal tenderness.  Musculoskeletal:     Cervical back: Neck supple.     Comments: No appreciable swelling on careful inspection of all 4 extremities, normal sensation, normal cap refill, normal distal pulses throughout all extremities  Skin:    General: Skin is warm and dry.  Neurological:     Mental Status: She is alert.    ED Results / Procedures / Treatments   Labs (all labs ordered are listed, but only abnormal results are displayed) Labs Reviewed  PROTIME-INR  CBC WITH DIFFERENTIAL/PLATELET  BASIC METABOLIC PANEL  MAGNESIUM  BRAIN NATRIURETIC PEPTIDE  TSH   TROPONIN I (HIGH SENSITIVITY)    EKG None  Radiology DG Chest Portable 1 View  Result Date: 09/17/2020 CLINICAL DATA:  Chest pain EXAM: PORTABLE CHEST 1 VIEW COMPARISON:  01/07/2016 FINDINGS: The heart size and mediastinal contours are within normal limits.No focal airspace disease. No pleural effusion or pneumothorax.No acute osseous abnormality. Postsurgical changes over the central upper abdomen. IMPRESSION: No evidence of acute cardiopulmonary disease.  Electronically Signed   By: Maurine Simmering   On: 09/17/2020 12:16    Procedures Procedures   Medications Ordered in ED Medications - No data to display  ED Course  I have reviewed the triage vital signs and the nursing notes.  Pertinent labs & imaging results that were available during my care of the patient were reviewed by me and considered in my medical decision making (see chart for details).    MDM Rules/Calculators/A&P                          70 year old lady presented to ER with concern for hand/feet swelling and abnormal heart rhythm.  Patient originally went to urgent care where they noted patient to be in bigeminy and sent to ER for further evaluation.  Patient here noted to have trigeminy on EKG and on cardiac monitor.  Ventricular rate in 70s.  Blood pressure stable.  Patient not having any palpitations or chest discomfort.  Basic lab work was all grossly within normal limits.  No electrolyte derangement, no renal dysfunction.  BNP just above range of normal limits.  Chest x-ray negative.  Based on my physical exam and chest x-ray, there is no evidence for fluid overload state.  Given the reported bigeminy and trigeminy on cardiac monitor here her and her slight elevation in BNP, recommended patient follow-up with cardiology and her primary care doctor.  Given her stable appearance at present and stable vital signs, believe she is appropriate for discharge and outpatient management.   After the discussed management above,  the patient was determined to be safe for discharge.  The patient was in agreement with this plan and all questions regarding their care were answered.  ED return precautions were discussed and the patient will return to the ED with any significant worsening of condition.  Final Clinical Impression(s) / ED Diagnoses Final diagnoses:  PVC (premature ventricular contraction)  Swelling of both hands    Rx / DC Orders ED Discharge Orders     None        Lucrezia Starch, MD 09/18/20 1022

## 2020-09-17 NOTE — ED Triage Notes (Signed)
Pt presents today with c/o of swelling to bilateral hands/feet x 3 days. She also c/o of neck pain (right). She does report having a recent Ultrasound of thyroid this past week and has not yet received results. Denies injury.

## 2020-09-17 NOTE — Progress Notes (Deleted)
Cardiology Office Note:    Date:  09/17/2020   ID:  Jaime Fuller, DOB 08-Oct-1950, MRN [id number]  PCP:  Jaime Fuller, Rayford Halsted, MD   [facility] Providers Cardiologist:  None {  Referring MD: Jaime Starch, MD     History of Present Illness:    Jaime Fuller is a 70 y.o. female with a hx of CKD, hypothyroidism, and anxiety who was referred to Dr. Roslynn Amble for PVCs and LE edema.  Patient presented to the ER 09/17/20 with hand and feet swelling. BNP 131, trop 4. CXR without cardiopulmonary disease. ECG with NSR with trigeminy. Patient denied any chest pressure or palpitations. Labs unremarkable. Given clinical stability, the patient was discharged home with plans for Cardiology follow-up.   Past Medical History:  Diagnosis Date   Anxiety state, unspecified 09/20/2007   Arthritis    Cataract    Chronic kidney disease    kidney stones   HYPOTHYROIDISM 03/08/2007   no meds needed now 04-15-19   Pancreatitis    secondary to ERCP    Past Surgical History:  Procedure Laterality Date   abdominal plasty     Carpel tunnel surgery left hand     Childbirth x 2     CHOLECYSTECTOMY     GASTRIC BYPASS     UPPER GASTROINTESTINAL ENDOSCOPY     WISDOM TOOTH EXTRACTION      Current Medications: No outpatient medications have been marked as taking for the 09/22/20 encounter (Appointment) with Jaime Bergeron, MD.     Allergies:   Bee venom and Vicodin [hydrocodone-acetaminophen]   Social History   Socioeconomic History   Marital status: Married    Spouse name: Not on file   Number of children: Not on file   Years of education: Not on file   Highest education level: Not on file  Occupational History   Not on file  Tobacco Use   Smoking status: Former    Pack years: 0.00   Smokeless tobacco: Never  Vaping Use   Vaping Use: Never used  Substance and Sexual Activity   Alcohol use: No    Alcohol/week: 0.0 standard drinks   Drug use: No   Sexual activity: Not  on file  Other Topics Concern   Not on file  Social History Narrative   Not on file   Social Determinants of Health   Financial Resource Strain: Not on file  Food Insecurity: Not on file  Transportation Needs: Not on file  Physical Activity: Not on file  Stress: Not on file  Social Connections: Not on file     Family History: The patient's ***family history includes Heart attack in her father; Lung cancer in her father; Thyroid disease in her mother. There is no history of Colon cancer, Esophageal cancer, Rectal cancer, or Stomach cancer.  ROS:   Please see the history of present illness.    *** All other systems reviewed and are negative.  EKGs/Labs/Other Studies Reviewed:    The following studies were reviewed today: ***  EKG:  EKG is *** ordered today.  The ekg ordered today demonstrates ***  Recent Labs: 03/31/2020: TSH 3.05 09/17/2020: B Natriuretic Peptide 131.6; BUN 11; Creatinine, Ser 0.87; Hemoglobin 12.3; Magnesium 2.1; Platelets 219; Potassium 3.8; Sodium 139  Recent Lipid Panel    Component Value Date/Time   CHOL 141 04/03/2019 0744   TRIG 48.0 04/03/2019 0744   HDL 68.50 04/03/2019 0744   CHOLHDL 2 04/03/2019 0744   VLDL 9.6  04/03/2019 0744   LDLCALC 63 04/03/2019 0744     Risk Assessment/Calculations:   {Does this patient have ATRIAL FIBRILLATION?:[phone]}       Physical Exam:    VS:  There were no vitals taken for this visit.    Wt Readings from Last 3 Encounters:  09/17/20 182 lb (82.6 kg)  07/15/20 182 lb 4 oz (82.7 kg)  06/07/20 185 lb (83.9 kg)     GEN: *** Well nourished, well developed in no acute distress HEENT: Normal NECK: No JVD; No carotid bruits LYMPHATICS: No lymphadenopathy CARDIAC: ***RRR, no murmurs, rubs, gallops RESPIRATORY:  Clear to auscultation without rales, wheezing or rhonchi  ABDOMEN: Soft, non-tender, non-distended MUSCULOSKELETAL:  No edema; No deformity  SKIN: Warm and dry NEUROLOGIC:  Alert and oriented x  3 PSYCHIATRIC:  Normal affect   ASSESSMENT:    No diagnosis found. PLAN:    In order of problems listed above:  #LE Edema: -Check TTE -Lasix?  #Trigeminy: -Check 7 day zio to assess PVC burden -? Beta blocker -Check TTE as above   {Are you ordering a CV Procedure (e.g. stress test, cath, DCCV, TEE, etc)?   Press F2        :[id number]}    Medication Adjustments/Labs and Tests Ordered: Current medicines are reviewed at length with the patient today.  Concerns regarding medicines are outlined above.  No orders of the defined types were placed in this encounter.  No orders of the defined types were placed in this encounter.   There are no Patient Instructions on file for this visit.   Signed, Jaime Bergeron, MD  09/17/2020 4:24 PM    [place]

## 2020-09-17 NOTE — ED Triage Notes (Signed)
Bradycardia with bigeminy. Mild Chest tightness in route relieved by aspirin. Was seen at [facility] recently for hand and feet swelling.

## 2020-09-17 NOTE — ED Notes (Signed)
Provider remains at bedside with patient. EMS has been called for transport. Husband has been notified by provider.

## 2020-09-21 ENCOUNTER — Ambulatory Visit (INDEPENDENT_AMBULATORY_CARE_PROVIDER_SITE_OTHER): Payer: Medicare Other | Admitting: Endocrinology

## 2020-09-21 ENCOUNTER — Other Ambulatory Visit: Payer: Self-pay

## 2020-09-21 ENCOUNTER — Encounter: Payer: Self-pay | Admitting: Endocrinology

## 2020-09-21 VITALS — BP 108/68 | HR 72 | Ht 62.0 in | Wt 184.0 lb

## 2020-09-21 DIAGNOSIS — E039 Hypothyroidism, unspecified: Secondary | ICD-10-CM | POA: Diagnosis not present

## 2020-09-21 LAB — T4, FREE: Free T4: 1 ng/dL (ref 0.60–1.60)

## 2020-09-21 LAB — TSH: TSH: 2.29 u[IU]/mL (ref 0.35–5.50)

## 2020-09-21 NOTE — Progress Notes (Signed)
Subjective:    Patient ID: Jaime Fuller, female    DOB: 21-Dec-1950, 70 y.o.   MRN: [id number]  HPI Pt returns for f/u of [place] (dx'ed 2022; she took synthroid for uncertain reason [id number]; since then, she has been euthyroid off rx; [place] (2022) showed MNG, with bx advised of RML 1.6 cm  nodule; bx cytol showed Atypia of undetermined significance (category III); Afirma: unable to evaluate; synthroid was resumed in 2021, due to elev TSH).  She does not notice the nodule Past Medical History:  Diagnosis Date   Anxiety state, unspecified 09/20/2007   Arthritis    Cataract    Chronic kidney disease    kidney stones   HYPOTHYROIDISM 03/08/2007   no meds needed now 04-15-19   Pancreatitis    secondary to ERCP    Past Surgical History:  Procedure Laterality Date   abdominal plasty     Carpel tunnel surgery left hand     Childbirth x 2     CHOLECYSTECTOMY     GASTRIC BYPASS     UPPER GASTROINTESTINAL ENDOSCOPY     WISDOM TOOTH EXTRACTION      Social History   Socioeconomic History   Marital status: Married    Spouse name: Not on file   Number of children: Not on file   Years of education: Not on file   Highest education level: Not on file  Occupational History   Not on file  Tobacco Use   Smoking status: Former   Smokeless tobacco: Never  Vaping Use   Vaping Use: Never used  Substance and Sexual Activity   Alcohol use: No    Alcohol/week: 0.0 standard drinks   Drug use: No   Sexual activity: Not on file  Other Topics Concern   Not on file  Social History Narrative   Not on file   Social Determinants of Health   Financial Resource Strain: Not on file  Food Insecurity: Not on file  Transportation Needs: Not on file  Physical Activity: Not on file  Stress: Not on file  Social Connections: Not on file  Intimate Partner Violence: Not on file    Current Outpatient Medications on File Prior to Visit  Medication Sig Dispense Refill   calcium carbonate (TUMS EX) 750 MG  chewable tablet Chew 1-3 tablets by mouth as needed for heartburn.     clonazePAM (KLONOPIN) 0.5 MG tablet Take 1 tablet (0.5 mg total) by mouth 2 (two) times daily as needed for anxiety. 30 tablet 1   EPINEPHrine 0.3 mg/0.3 mL IJ SOAJ injection Inject 0.3 mLs (0.3 mg total) into the muscle as needed for anaphylaxis. 1 each 1   famotidine (PEPCID) 20 MG tablet Take 1 tablet (20 mg total) by mouth 2 (two) times daily. 180 tablet 3   levothyroxine (SYNTHROID) 50 MCG tablet Take 1 tablet (50 mcg total) by mouth daily. 90 tablet 1   omeprazole (PRILOSEC) 40 MG capsule Take 1 capsule (40 mg total) by mouth in the morning and at bedtime. 60 capsule 0   sucralfate (CARAFATE) 1 GM/10ML suspension Take 10 mLs (1 g total) by mouth 4 (four) times daily. 420 mL 1   valACYclovir (VALTREX) 1000 MG tablet ONE TABLET TWICE A DAY X 5 DAYS AS NEEDED FOR OUTBREAK 50 tablet 1   venlafaxine XR (EFFEXOR XR) 37.5 MG 24 hr capsule Take 1 capsule (37.5 mg total) by mouth at bedtime. (Patient not taking: No sig reported) 30 capsule 0   No  current facility-administered medications on file prior to visit.    Allergies  Allergen Reactions   Bee Venom Anaphylaxis   Vicodin [Hydrocodone-Acetaminophen] Itching    Family History  Problem Relation Age of Onset   Thyroid disease Mother    Lung cancer Father    Heart attack Father        Died at 103   Colon cancer Neg Hx    Esophageal cancer Neg Hx    Rectal cancer Neg Hx    Stomach cancer Neg Hx     BP 108/68   Pulse 72   Ht 5\' 2"  (1.575 m)   Wt 184 lb (83.5 kg)   SpO2 99%   BMI 33.65 kg/m    Review of Systems     Objective:   Physical Exam  GENERAL: no distress.   NECK: The right thyroid nodule is palpable, and freely mobile.  No palpable lymphadenopathy at the anterior neck.     [place] (2022): 1. Nodule 2 (TI-RADS 4) located in the mid right thyroid lobe is not significantly changed in size since prior exam. This meets criteria for imaging  surveillance. Annual ultrasound surveillance is recommended until 5 years of stability is documented. Alternatively this nodule could represent a parathyroid adenoma. Please correlate with laboratory workup for hyperparathyroidism. 2. Nodule 3 (TI-RADS 4) located in the mid left thyroid lobe meets criteria for surveillance. It is not significantly changed in size since prior examination. Annual ultrasound surveillance is recommended until 5 years of stability is documented.  Lab Results  Component Value Date   TSH 2.29 09/21/2020      Assessment & Plan:  Hypothyroidism: well-controlled.  Please continue the same synthroid.  MNG: we discussed [place] surveillance.   Patient Instructions  Thyroid blood tests are requested for you today.  We'll let you know about the results.   Please come back for a follow-up appointment in 1 year.

## 2020-09-21 NOTE — Patient Instructions (Addendum)
Thyroid blood tests are requested for you today.  We'll let you know about the results.   Please come back for a follow-up appointment in 1 year.

## 2020-09-22 ENCOUNTER — Encounter: Payer: Self-pay | Admitting: Cardiology

## 2020-09-22 ENCOUNTER — Telehealth: Payer: Self-pay | Admitting: *Deleted

## 2020-09-22 ENCOUNTER — Ambulatory Visit (INDEPENDENT_AMBULATORY_CARE_PROVIDER_SITE_OTHER): Payer: Medicare Other | Admitting: Cardiology

## 2020-09-22 ENCOUNTER — Ambulatory Visit (INDEPENDENT_AMBULATORY_CARE_PROVIDER_SITE_OTHER): Payer: Medicare Other

## 2020-09-22 VITALS — BP 108/64 | HR 60 | Ht 62.0 in | Wt 183.6 lb

## 2020-09-22 DIAGNOSIS — Z01812 Encounter for preprocedural laboratory examination: Secondary | ICD-10-CM

## 2020-09-22 DIAGNOSIS — R001 Bradycardia, unspecified: Secondary | ICD-10-CM

## 2020-09-22 DIAGNOSIS — R002 Palpitations: Secondary | ICD-10-CM

## 2020-09-22 DIAGNOSIS — R0602 Shortness of breath: Secondary | ICD-10-CM | POA: Diagnosis not present

## 2020-09-22 DIAGNOSIS — R072 Precordial pain: Secondary | ICD-10-CM | POA: Diagnosis not present

## 2020-09-22 DIAGNOSIS — R008 Other abnormalities of heart beat: Secondary | ICD-10-CM

## 2020-09-22 DIAGNOSIS — Z0189 Encounter for other specified special examinations: Secondary | ICD-10-CM

## 2020-09-22 DIAGNOSIS — R600 Localized edema: Secondary | ICD-10-CM

## 2020-09-22 DIAGNOSIS — R079 Chest pain, unspecified: Secondary | ICD-10-CM

## 2020-09-22 MED ORDER — FUROSEMIDE 20 MG PO TABS
ORAL_TABLET | ORAL | 1 refills | Status: DC
Start: 2020-09-22 — End: 2023-04-25

## 2020-09-22 NOTE — Patient Instructions (Addendum)
Medication Instructions:   START TAKING LASIX 20 MG BY MOUTH THREE TIMES WEEKLY AS NEEDED FOR LOWER EXTREMITY SWELLING  *If you need a refill on your cardiac medications before your next appointment, please call your pharmacy*   Testing/Procedures:  Your physician has requested that you have an echocardiogram. Echocardiography is a painless test that uses sound waves to create images of your heart. It provides your doctor with information about the size and shape of your heart and how well your heart's chambers and valves are working. This procedure takes approximately one hour. There are no restrictions for this procedure.   ZIO XT- Long Term Monitor Instructions  Your physician has requested you wear a ZIO patch monitor for 3 days.  This is a single patch monitor. [employer] supplies one patch monitor per enrollment. Additional stickers are not available. Please do not apply patch if you will be having a Nuclear Stress Test,  Echocardiogram, Cardiac CT, MRI, or Chest Xray during the period you would be wearing the  monitor. The patch cannot be worn during these tests. You cannot remove and re-apply the  ZIO XT patch monitor.  Your ZIO patch monitor will be mailed 3 day USPS to your address on file. It may take 3-5 days  to receive your monitor after you have been enrolled.  Once you have received your monitor, please review the enclosed instructions. Your monitor  has already been registered assigning a specific monitor serial # to you.  Billing and Patient Assistance Program Information  We have supplied [employer] with any of your insurance information on file for billing purposes. [employer] offers a sliding scale Patient Assistance Program for patients that do not have  insurance, or whose insurance does not completely cover the cost of the ZIO monitor.  You must apply for the Patient Assistance Program to qualify for this discounted rate.  To apply, please call [employer] at [phone],  select option 4, select option 2, ask to apply for  Patient Assistance Program. Theodore Demark will ask your household income, and how many people  are in your household. They will quote your out-of-pocket cost based on that information.  [employer] will also be able to set up a 86-month, interest-free payment plan if needed.  Applying the monitor   Shave hair from upper left chest.  Hold abrader disc by orange tab. Rub abrader in 40 strokes over the upper left chest as  indicated in your monitor instructions.  Clean area with 4 enclosed alcohol pads. Let dry.  Apply patch as indicated in monitor instructions. Patch will be placed under collarbone on left  side of chest with arrow pointing upward.  Rub patch adhesive wings for 2 minutes. Remove white label marked "1". Remove the white  label marked "2". Rub patch adhesive wings for 2 additional minutes.  While looking in a mirror, press and release button in center of patch. A small green light will  flash 3-4 times. This will be your only indicator that the monitor has been turned on.  Do not shower for the first 24 hours. You may shower after the first 24 hours.  Press the button if you feel a symptom. You will hear a small click. Record Date, Time and  Symptom in the Patient Logbook.  When you are ready to remove the patch, follow instructions on the last 2 pages of Patient  Logbook. Stick patch monitor onto the last page of Patient Logbook.  Place Patient Logbook in the blue and white box.  Use locking tab on box and tape box closed  securely. The blue and white box has prepaid postage on it. Please place it in the mailbox as  soon as possible. Your physician should have your test results approximately 7 days after the  monitor has been mailed back to [facility].  Call [place] at [phone] if you have questions regarding  your ZIO XT patch monitor. Call them immediately if you see an orange light blinking on your   monitor.  If your monitor falls off in less than 4 days, contact our Monitor department at [phone].  If your monitor becomes loose or falls off after 4 days call [employer] at [phone] for  suggestions on securing your monitor   CORONARY CT  Your cardiac CT will be scheduled at one of the below locations:   [facility] [address] [place], [place] [address] [phone]  If scheduled at [facility], please arrive at the [facility] main entrance (entrance A) of [facility] 30 minutes prior to test start time. Proceed to the [facility] Radiology Department (first floor) to check-in and test prep.  Please follow these instructions carefully (unless otherwise directed):  On the Night Before the Test: Be sure to Drink plenty of water. Do not consume any caffeinated/decaffeinated beverages or chocolate 12 hours prior to your test. Do not take any antihistamines 12 hours prior to your test.  On the Day of the Test: Drink plenty of water until 1 hour prior to the test. Do not eat any food 4 hours prior to the test. You may take your regular medications prior to the test.  HOLD Furosemide  morning of the test. FEMALES- please wear underwire-free bra if available, avoid dresses & tight clothing   After the Test: Drink plenty of water. After receiving IV contrast, you may experience a mild flushed feeling. This is normal. On occasion, you may experience a mild rash up to 24 hours after the test. This is not dangerous. If this occurs, you can take Benadryl 25 mg and increase your fluid intake. If you experience trouble breathing, this can be serious. If it is severe call 911 IMMEDIATELY. If it is mild, please call our office. If you take any of these medications: Glipizide/Metformin, Avandament, Glucavance, please do not take 48 hours after completing test unless otherwise instructed.   Once we have confirmed authorization from your insurance  company, we will call you to set up a date and time for your test. Based on how quickly your insurance processes prior authorizations requests, please allow up to 4 weeks to be contacted for scheduling your Cardiac CT appointment. Be advised that routine Cardiac CT appointments could be scheduled as many as 8 weeks after your provider has ordered it.  For non-scheduling related questions, please contact the cardiac imaging nurse navigator should you have any questions/concerns: Marchia Bond, Cardiac Imaging Nurse Navigator Gordy Clement, Cardiac Imaging Nurse Navigator [facility] Direct Office Dial: [phone]   For scheduling needs, including cancellations and rescheduling, please call [place], [phone].   Follow-Up:  3- 4 MONTHS IN THE OFFICE WITH AN EXTENDER

## 2020-09-22 NOTE — Telephone Encounter (Signed)
-----   Message from Jennefer Bravo sent at 09/22/2020 10:36 AM EDT ----- Regarding: RE: 3 DAY ZIO FOR PALPITATIONS Done  ----- Message ----- From: Nuala Alpha, LPN Sent: 4/99/6924  10:27 AM EDT To: Jennefer Bravo, Katrina Theda [place] Subject: 3 DAY ZIO FOR PALPITATIONS                     Dr. Johney Frame saw this pt in clinic today and wants her to have a 3 day zio for palp and trigeminy. Order is in. Can you please enroll and let me know when you do?  Thanks, [employer]

## 2020-09-22 NOTE — Progress Notes (Signed)
Cardiology Office Note:    Date:  09/22/2020   ID:  CHAYCE RULLO, DOB 08-12-1950, MRN [id number]  PCP:  Isaac Bliss, Rayford Halsted, MD   [facility] Providers Cardiologist:  None {  Referring MD: Lucrezia Starch, MD     History of Present Illness:    Jaime Fuller is a 70 y.o. female with a hx of CKD, hypothyroidism, and anxiety who was referred to Dr. Roslynn Amble for PVCs and LE/UE edema.  Patient presented to the ER 09/17/20 with hand and feet swelling. BNP 131, trop 4. CXR without cardiopulmonary disease. ECG with NSR with trigeminy. Patient denied any chest pressure or palpitations. Labs unremarkable. Given clinical stability, the patient was discharged home with plans for Cardiology follow-up.  Today, the patient states she continues to have LE edema and swelling in her hands that prompted her to go to urgent care. Also notes progressive fatigue with activity and the sensation that she "slowing down." No chest pain, significant DOE, lightheadedness or dizziness. Denies palpitations and states she does not feel her PVCs. No known personal history of CAD, HF or arrhythmias, however, family history notably for her father who passed away from MI at age of 53.  Past Medical History:  Diagnosis Date   Anxiety state, unspecified 09/20/2007   Arthritis    Cataract    Chronic kidney disease    kidney stones   HYPOTHYROIDISM 03/08/2007   no meds needed now 04-15-19   Pancreatitis    secondary to ERCP    Past Surgical History:  Procedure Laterality Date   abdominal plasty     Carpel tunnel surgery left hand     Childbirth x 2     CHOLECYSTECTOMY     GASTRIC BYPASS     UPPER GASTROINTESTINAL ENDOSCOPY     WISDOM TOOTH EXTRACTION      Current Medications: Current Meds  Medication Sig   calcium carbonate (TUMS EX) 750 MG chewable tablet Chew 1-3 tablets by mouth as needed for heartburn.   clonazePAM (KLONOPIN) 0.5 MG tablet Take 1 tablet (0.5 mg total) by mouth 2 (two) times  daily as needed for anxiety.   EPINEPHrine 0.3 mg/0.3 mL IJ SOAJ injection Inject 0.3 mLs (0.3 mg total) into the muscle as needed for anaphylaxis.   famotidine (PEPCID) 20 MG tablet Take 1 tablet (20 mg total) by mouth 2 (two) times daily.   furosemide (LASIX) 20 MG tablet Take 20 mg by mouth three times weekly as needed for lower extremity swelling.   levothyroxine (SYNTHROID) 50 MCG tablet Take 1 tablet (50 mcg total) by mouth daily.   omeprazole (PRILOSEC) 40 MG capsule Take 1 capsule (40 mg total) by mouth in the morning and at bedtime.   sucralfate (CARAFATE) 1 GM/10ML suspension Take 10 mLs (1 g total) by mouth 4 (four) times daily.   valACYclovir (VALTREX) 1000 MG tablet ONE TABLET TWICE A DAY X 5 DAYS AS NEEDED FOR OUTBREAK     Allergies:   Bee venom and Vicodin [hydrocodone-acetaminophen]   Social History   Socioeconomic History   Marital status: Married    Spouse name: Not on file   Number of children: Not on file   Years of education: Not on file   Highest education level: Not on file  Occupational History   Not on file  Tobacco Use   Smoking status: Former    Pack years: 0.00   Smokeless tobacco: Never  Vaping Use   Vaping Use: Never used  Substance and Sexual Activity   Alcohol use: No    Alcohol/week: 0.0 standard drinks   Drug use: No   Sexual activity: Not on file  Other Topics Concern   Not on file  Social History Narrative   Not on file   Social Determinants of Health   Financial Resource Strain: Not on file  Food Insecurity: Not on file  Transportation Needs: Not on file  Physical Activity: Not on file  Stress: Not on file  Social Connections: Not on file     Family History: The patient's family history includes Heart attack in her father; Lung cancer in her father; Thyroid disease in her mother. There is no history of Colon cancer, Esophageal cancer, Rectal cancer, or Stomach cancer.  ROS:   Review of Systems  Constitutional:  Positive for  malaise/fatigue. Negative for chills and diaphoresis.  HENT:  Negative for hearing loss and sinus pain.   Eyes:  Negative for blurred vision and pain.  Respiratory:  Negative for cough and shortness of breath.   Cardiovascular:  Positive for leg swelling. Negative for chest pain, palpitations, orthopnea, claudication and PND.  Gastrointestinal:  Negative for abdominal pain, blood in stool, constipation and diarrhea.  Genitourinary:  Negative for dysuria and frequency.  Musculoskeletal:  Negative for back pain, joint pain, myalgias and neck pain.  Neurological:  Positive for tingling. Negative for dizziness and weakness.  Endo/Heme/Allergies:  Negative for polydipsia. Does not bruise/bleed easily.  Psychiatric/Behavioral:  Negative for depression. The patient does not have insomnia.     EKGs/Labs/Other Studies Reviewed:    No CV studies prior.  EKG:   09/22/2020: no EKG ordered today.  09/17/2020 : NSR, with venticular trigeminy   Recent Labs: 09/17/2020: B Natriuretic Peptide 131.6; BUN 11; Creatinine, Ser 0.87; Hemoglobin 12.3; Magnesium 2.1; Platelets 219; Potassium 3.8; Sodium 139 09/21/2020: TSH 2.29  Recent Lipid Panel    Component Value Date/Time   CHOL 141 04/03/2019 0744   TRIG 48.0 04/03/2019 0744   HDL 68.50 04/03/2019 0744   CHOLHDL 2 04/03/2019 0744   VLDL 9.6 04/03/2019 0744   LDLCALC 63 04/03/2019 0744         Physical Exam:    VS:  BP 108/64   Pulse 60   Ht 5\' 2"  (1.575 m)   Wt 183 lb 9.6 oz (83.3 kg)   SpO2 97%   BMI 33.58 kg/m     Wt Readings from Last 3 Encounters:  09/22/20 183 lb 9.6 oz (83.3 kg)  09/21/20 184 lb (83.5 kg)  09/17/20 182 lb (82.6 kg)     GEN:  Well nourished, well developed in no acute distress HEENT: Normal NECK: No JVD; No carotid bruits CARDIAC: Bradycardic, regular, no murmurs, rubs, gallops RESPIRATORY:  Clear to auscultation without rales, wheezing or rhonchi  ABDOMEN: Soft, non-tender, non-distended MUSCULOSKELETAL:   Trace edema, warm SKIN: Warm and dry NEUROLOGIC:  Alert and oriented x 3 PSYCHIATRIC:  Normal affect   ASSESSMENT:    1. Chest pain of uncertain etiology   2. Bilateral lower extremity edema   3. SOB (shortness of breath)   4. Palpitations   5. Trigeminy   6. Encounter for laboratory examination   7. Precordial pain   8. Sinus bradycardia    PLAN:    In order of problems listed above:  #Progressive Fatigue with Exertion: Patient notes progressive fatigue with exertion that has been ongoing for several months. Finding she has to take more breaks than she used to. No  chest pain or SOB, however, given frequent ventricular ectopy and family history, there is concern for anginal equivalent. Will check coronary CTA and TTE. -Check coronary CTA -Check TTE  #UE/LE Edema: Trace on exam. BNP only mildly elevated in ER.  -Check TTE -Will give lasix 20mg  PO prn up to 3x/week to help with symptoms -Low Na diet  #Ventricular Trigeminy: #Sinus Bradycardia: No palpitations, however, patient is having progressive fatigue as above. Will check zio to ensure HR augments as well as to assess PVC burden.  -Check 3 day zio to assess PVC burden -Check TTE as above   Follow up in 3 months  Medication Adjustments/Labs and Tests Ordered: Current medicines are reviewed at length with the patient today.  Concerns regarding medicines are outlined above.  Orders Placed This Encounter  Procedures   CT CORONARY MORPH W/CTA COR W/SCORE W/CA W/CM &/OR WO/CM   Basic metabolic panel   LONG TERM MONITOR (3-14 DAYS)   ECHOCARDIOGRAM COMPLETE   Meds ordered this encounter  Medications   furosemide (LASIX) 20 MG tablet    Sig: Take 20 mg by mouth three times weekly as needed for lower extremity swelling.    Dispense:  45 tablet    Refill:  1    Patient Instructions  Medication Instructions:   START TAKING LASIX 20 MG BY MOUTH THREE TIMES WEEKLY AS NEEDED FOR LOWER EXTREMITY SWELLING  *If you  need a refill on your cardiac medications before your next appointment, please call your pharmacy*   Testing/Procedures:  Your physician has requested that you have an echocardiogram. Echocardiography is a painless test that uses sound waves to create images of your heart. It provides your doctor with information about the size and shape of your heart and how well your heart's chambers and valves are working. This procedure takes approximately one hour. There are no restrictions for this procedure.   ZIO XT- Long Term Monitor Instructions  Your physician has requested you wear a ZIO patch monitor for 3 days.  This is a single patch monitor. [employer] supplies one patch monitor per enrollment. Additional stickers are not available. Please do not apply patch if you will be having a Nuclear Stress Test,  Echocardiogram, Cardiac CT, MRI, or Chest Xray during the period you would be wearing the  monitor. The patch cannot be worn during these tests. You cannot remove and re-apply the  ZIO XT patch monitor.  Your ZIO patch monitor will be mailed 3 day USPS to your address on file. It may take 3-5 days  to receive your monitor after you have been enrolled.  Once you have received your monitor, please review the enclosed instructions. Your monitor  has already been registered assigning a specific monitor serial # to you.  Billing and Patient Assistance Program Information  We have supplied [employer] with any of your insurance information on file for billing purposes. [employer] offers a sliding scale Patient Assistance Program for patients that do not have  insurance, or whose insurance does not completely cover the cost of the ZIO monitor.  You must apply for the Patient Assistance Program to qualify for this discounted rate.  To apply, please call [employer] at [phone], select option 4, select option 2, ask to apply for  Patient Assistance Program. Theodore Demark will ask your household income, and how many  people  are in your household. They will quote your out-of-pocket cost based on that information.  [employer] will also be able to set up a 86-month, interest-free  payment plan if needed.  Applying the monitor   Shave hair from upper left chest.  Hold abrader disc by orange tab. Rub abrader in 40 strokes over the upper left chest as  indicated in your monitor instructions.  Clean area with 4 enclosed alcohol pads. Let dry.  Apply patch as indicated in monitor instructions. Patch will be placed under collarbone on left  side of chest with arrow pointing upward.  Rub patch adhesive wings for 2 minutes. Remove white label marked "1". Remove the white  label marked "2". Rub patch adhesive wings for 2 additional minutes.  While looking in a mirror, press and release button in center of patch. A small green light will  flash 3-4 times. This will be your only indicator that the monitor has been turned on.  Do not shower for the first 24 hours. You may shower after the first 24 hours.  Press the button if you feel a symptom. You will hear a small click. Record Date, Time and  Symptom in the Patient Logbook.  When you are ready to remove the patch, follow instructions on the last 2 pages of Patient  Logbook. Stick patch monitor onto the last page of Patient Logbook.  Place Patient Logbook in the blue and white box. Use locking tab on box and tape box closed  securely. The blue and white box has prepaid postage on it. Please place it in the mailbox as  soon as possible. Your physician should have your test results approximately 7 days after the  monitor has been mailed back to [facility].  Call [place] at [phone] if you have questions regarding  your ZIO XT patch monitor. Call them immediately if you see an orange light blinking on your  monitor.  If your monitor falls off in less than 4 days, contact our Monitor department at [phone].  If your monitor becomes  loose or falls off after 4 days call [employer] at [phone] for  suggestions on securing your monitor   CORONARY CT  Your cardiac CT will be scheduled at one of the below locations:   [facility] [address] [place], [place] [address] [phone]  If scheduled at [facility], please arrive at the [facility] main entrance (entrance A) of [facility] 30 minutes prior to test start time. Proceed to the [facility] Radiology Department (first floor) to check-in and test prep.  Please follow these instructions carefully (unless otherwise directed):  On the Night Before the Test: Be sure to Drink plenty of water. Do not consume any caffeinated/decaffeinated beverages or chocolate 12 hours prior to your test. Do not take any antihistamines 12 hours prior to your test.  On the Day of the Test: Drink plenty of water until 1 hour prior to the test. Do not eat any food 4 hours prior to the test. You may take your regular medications prior to the test.  HOLD Furosemide  morning of the test. FEMALES- please wear underwire-free bra if available, avoid dresses & tight clothing   After the Test: Drink plenty of water. After receiving IV contrast, you may experience a mild flushed feeling. This is normal. On occasion, you may experience a mild rash up to 24 hours after the test. This is not dangerous. If this occurs, you can take Benadryl 25 mg and increase your fluid intake. If you experience trouble breathing, this can be serious. If it is severe call 911 IMMEDIATELY. If it  is mild, please call our office. If you take any of these medications: Glipizide/Metformin, Avandament, Glucavance, please do not take 48 hours after completing test unless otherwise instructed.   Once we have confirmed authorization from your insurance company, we will call you to set up a date and time for your test. Based on how quickly your insurance processes prior authorizations  requests, please allow up to 4 weeks to be contacted for scheduling your Cardiac CT appointment. Be advised that routine Cardiac CT appointments could be scheduled as many as 8 weeks after your provider has ordered it.  For non-scheduling related questions, please contact the cardiac imaging nurse navigator should you have any questions/concerns: Marchia Bond, Cardiac Imaging Nurse Navigator Gordy Clement, Cardiac Imaging Nurse Navigator [facility] Direct Office Dial: [phone]   For scheduling needs, including cancellations and rescheduling, please call [place], [phone].   Follow-Up:  3- 4 MONTHS IN THE OFFICE WITH AN EXTENDER      I,Jada Bradford,acting as a scribe for Freada Bergeron, MD.,have documented all relevant documentation on the behalf of Freada Bergeron, MD,as directed by  Freada Bergeron, MD while in the presence of Freada Bergeron, MD.  I, Freada Bergeron, MD, have reviewed all documentation for this visit. The documentation on 09/22/20 for the exam, diagnosis, procedures, and orders are all accurate and complete.  Signed, Freada Bergeron, MD  09/22/2020 11:21 AM    [place]

## 2020-09-22 NOTE — Progress Notes (Unsigned)
Patient enrolled for [employer] to mail a 3 day ZIO XT monitor to address on file.

## 2020-09-22 NOTE — Telephone Encounter (Signed)
-----   Message from Lorenza Evangelist, RN sent at 09/22/2020 10:47 AM EDT ----- Regarding: RE: cardiac ct Forwarding to pre-cert to make sure auth is good Clarise Cruz  ----- Message ----- From: Nuala Alpha, LPN Sent: 03/15/1115  10:28 AM EDT To: Lorenza Evangelist, RN, Eli Hose, RN, # Subject: cardiac ct                                     Dr. Johney Frame saw this pt in clinic today and ordered for her to get a Cardiac CT done for sob and CP.  Order is in and instructions given. Can you please schedule and shoot me the date when done?  Thanks for all you do, Karlene Einstein

## 2020-09-26 DIAGNOSIS — R008 Other abnormalities of heart beat: Secondary | ICD-10-CM | POA: Diagnosis not present

## 2020-09-26 DIAGNOSIS — R0602 Shortness of breath: Secondary | ICD-10-CM

## 2020-09-26 DIAGNOSIS — R002 Palpitations: Secondary | ICD-10-CM | POA: Diagnosis not present

## 2020-09-28 ENCOUNTER — Telehealth: Payer: Self-pay | Admitting: *Deleted

## 2020-09-28 NOTE — Telephone Encounter (Signed)
-----   Message from Melony Overly sent at 09/28/2020  8:44 AM EDT ----- Regarding: ct heart Scheduled 10/05/20 at 1:00    Thanks, [place]

## 2020-10-01 ENCOUNTER — Other Ambulatory Visit: Payer: Self-pay | Admitting: Cardiology

## 2020-10-01 ENCOUNTER — Other Ambulatory Visit (HOSPITAL_COMMUNITY): Payer: Self-pay | Admitting: Emergency Medicine

## 2020-10-01 ENCOUNTER — Telehealth (HOSPITAL_COMMUNITY): Payer: Self-pay | Admitting: *Deleted

## 2020-10-01 DIAGNOSIS — R008 Other abnormalities of heart beat: Secondary | ICD-10-CM | POA: Diagnosis not present

## 2020-10-01 DIAGNOSIS — R079 Chest pain, unspecified: Secondary | ICD-10-CM

## 2020-10-01 DIAGNOSIS — R002 Palpitations: Secondary | ICD-10-CM | POA: Diagnosis not present

## 2020-10-01 DIAGNOSIS — R0602 Shortness of breath: Secondary | ICD-10-CM | POA: Diagnosis not present

## 2020-10-01 MED ORDER — METOPROLOL TARTRATE 25 MG PO TABS: 25.0000 mg | ORAL_TABLET | Freq: Once | ORAL | 0 refills | Status: DC

## 2020-10-01 NOTE — Progress Notes (Signed)
$'25mg'v$  metoprolol tartrate one time dose prescribed to patients pharm for HR control for CCTA. Pt has frequent ectopy and vent trigeminy.  Marchia Bond RN Navigator Cardiac Imaging [facility] [phone] Office  [phone] Cell

## 2020-10-01 NOTE — Telephone Encounter (Signed)
Reaching out to patient to offer assistance regarding upcoming cardiac imaging study; pt verbalizes understanding of appt date/time, parking situation and where to check in, pre-test NPO status and medications ordered, and verified current allergies; name and call back number provided for further questions should they arise  Gordy Clement RN Navigator Cardiac Imaging [facility] [phone] office [phone] cell  Patient to take '25mg'$  metoprolol tartrate 2 hours prior to cardiac CT. Patient was encouraged to take her Klonopin if she felt she needed for test anxiety but patient reports she has successfully complete MRIs and CT scans without needing anti-anxiety medication. She verbalized understanding that she would need a driver if she took her anti-anxiety medication.

## 2020-10-04 ENCOUNTER — Other Ambulatory Visit: Payer: Medicare Other

## 2020-10-05 ENCOUNTER — Other Ambulatory Visit: Payer: Self-pay

## 2020-10-05 ENCOUNTER — Ambulatory Visit (HOSPITAL_COMMUNITY)
Admission: RE | Admit: 2020-10-05 | Discharge: 2020-10-05 | Disposition: A | Discharge status: Home / Self Care | Payer: Medicare Other | Source: Ambulatory Visit | Attending: Cardiology | Admitting: Cardiology

## 2020-10-05 DIAGNOSIS — R0602 Shortness of breath: Secondary | ICD-10-CM | POA: Diagnosis not present

## 2020-10-05 DIAGNOSIS — R079 Chest pain, unspecified: Secondary | ICD-10-CM | POA: Insufficient documentation

## 2020-10-05 DIAGNOSIS — R072 Precordial pain: Secondary | ICD-10-CM | POA: Diagnosis not present

## 2020-10-05 MED ORDER — SODIUM CHLORIDE 0.9 % IV BOLUS
250.0000 mL | Freq: Once | INTRAVENOUS | Status: AC
  Administered 2020-10-05: 250 mL via INTRAVENOUS

## 2020-10-05 MED ORDER — NITROGLYCERIN 0.4 MG SL SUBL
SUBLINGUAL_TABLET | SUBLINGUAL | Status: AC
  Filled 2020-10-05: qty 1

## 2020-10-05 MED ORDER — NITROGLYCERIN 0.4 MG SL SUBL
0.4000 mg | SUBLINGUAL_TABLET | Freq: Once | SUBLINGUAL | Status: AC
  Administered 2020-10-05: 0.4 mg via SUBLINGUAL

## 2020-10-05 MED ORDER — IOHEXOL 350 MG/ML SOLN
100.0000 mL | Freq: Once | INTRAVENOUS | Status: AC | PRN
  Administered 2020-10-05: 100 mL via INTRAVENOUS

## 2020-10-05 NOTE — Progress Notes (Signed)
Pt states she feels good after 237m bolus. Walked out to waiting room without difficulty. D/C home ambulatory with husband.

## 2020-10-13 ENCOUNTER — Ambulatory Visit (HOSPITAL_COMMUNITY): Payer: Medicare Other | Attending: Cardiovascular Disease

## 2020-10-13 ENCOUNTER — Other Ambulatory Visit: Payer: Self-pay

## 2020-10-13 DIAGNOSIS — R0602 Shortness of breath: Secondary | ICD-10-CM

## 2020-10-13 DIAGNOSIS — R079 Chest pain, unspecified: Secondary | ICD-10-CM | POA: Diagnosis not present

## 2020-10-13 LAB — ECHOCARDIOGRAM COMPLETE
Area-P 1/2: 2.01 cm2
S' Lateral: 2.7 cm

## 2020-11-11 ENCOUNTER — Other Ambulatory Visit: Payer: Self-pay | Admitting: Internal Medicine

## 2020-11-25 ENCOUNTER — Other Ambulatory Visit: Payer: Self-pay

## 2020-11-25 DIAGNOSIS — R0989 Other specified symptoms and signs involving the circulatory and respiratory systems: Secondary | ICD-10-CM

## 2020-11-25 DIAGNOSIS — R198 Other specified symptoms and signs involving the digestive system and abdomen: Secondary | ICD-10-CM

## 2020-11-25 DIAGNOSIS — K219 Gastro-esophageal reflux disease without esophagitis: Secondary | ICD-10-CM

## 2020-11-25 MED ORDER — OMEPRAZOLE 40 MG PO CPDR
40.0000 mg | DELAYED_RELEASE_CAPSULE | Freq: Two times a day (BID) | ORAL | 3 refills | Status: DC
Start: 2020-11-25 — End: 2021-03-16

## 2020-11-25 NOTE — Telephone Encounter (Signed)
Omeprazole refilled okay according to last office note.

## 2021-01-12 NOTE — Progress Notes (Signed)
Cardiology Office Note    Date:  01/19/2021   ID:  Jaime Fuller, DOB 12/13/1950, MRN [id number]   PCP:  Isaac Bliss, Rayford Halsted, MD   [place]  Cardiologist:  Freada Bergeron, MD   Advanced Practice Provider:  No care team member to display Electrophysiologist:  None   [phone]   Chief Complaint  Patient presents with   Follow-up    History of Present Illness:  Jaime Fuller is a 70 y.o. female with history of CKD, hypothyroidism, anxiety.  Patient saw Dr. Johney Frame 09/22/2020 for lower extremity and upper extremity edema as well as PVCs.  She was also having fatigue with exertion.  She was given Lasix as needed.  She was having some ventricular trigeminy and sinus bradycardia.  2D echo 10/13/2020 normal LVEF 60 to 65% with trivial MR, ZIO monitor overall normal with rare PACs no arrhythmias, coronary CTA 0 to 24% proximal LAD otherwise normal calcium score 11.  Over read of CT normal.  Patient comes in for f/u. Since summer is over her hands aren't swelling. She has a salt water pool and wonders if that was causing her problem. Swims for back problems. Taking care of husband who has a PIIC line.  Past Medical History:  Diagnosis Date   Anxiety state, unspecified 09/20/2007   Arthritis    Cataract    Chronic kidney disease    kidney stones   HYPOTHYROIDISM 03/08/2007   no meds needed now 04-15-19   Pancreatitis    secondary to ERCP    Past Surgical History:  Procedure Laterality Date   abdominal plasty     Carpel tunnel surgery left hand     Childbirth x 2     CHOLECYSTECTOMY     GASTRIC BYPASS     UPPER GASTROINTESTINAL ENDOSCOPY     WISDOM TOOTH EXTRACTION      Current Medications: Current Meds  Medication Sig   calcium carbonate (TUMS EX) 750 MG chewable tablet Chew 1-3 tablets by mouth as needed for heartburn.   clonazePAM (KLONOPIN) 0.5 MG tablet Take 1 tablet (0.5 mg total) by mouth 2 (two) times daily as needed for  anxiety.   EPINEPHrine 0.3 mg/0.3 mL IJ SOAJ injection Inject 0.3 mLs (0.3 mg total) into the muscle as needed for anaphylaxis.   famotidine (PEPCID) 20 MG tablet Take 1 tablet (20 mg total) by mouth 2 (two) times daily.   furosemide (LASIX) 20 MG tablet Take 20 mg by mouth three times weekly as needed for lower extremity swelling.   levothyroxine (SYNTHROID) 50 MCG tablet TAKE 1 TABLET(50 MCG) BY MOUTH DAILY   metoprolol tartrate (LOPRESSOR) 25 MG tablet Take 1 tablet (25 mg total) by mouth once for 1 dose. Take 2 hr prior to CT scan   omeprazole (PRILOSEC) 40 MG capsule Take 1 capsule (40 mg total) by mouth in the morning and at bedtime.   sucralfate (CARAFATE) 1 GM/10ML suspension Take 10 mLs (1 g total) by mouth 4 (four) times daily.   valACYclovir (VALTREX) 1000 MG tablet ONE TABLET TWICE A DAY X 5 DAYS AS NEEDED FOR OUTBREAK     Allergies:   Bee venom and Vicodin [hydrocodone-acetaminophen]   Social History   Socioeconomic History   Marital status: Married    Spouse name: Not on file   Number of children: Not on file   Years of education: Not on file   Highest education level: Not on file  Occupational History  Not on file  Tobacco Use   Smoking status: Former   Smokeless tobacco: Never  Vaping Use   Vaping Use: Never used  Substance and Sexual Activity   Alcohol use: No    Alcohol/week: 0.0 standard drinks   Drug use: No   Sexual activity: Not on file  Other Topics Concern   Not on file  Social History Narrative   Not on file   Social Determinants of Health   Financial Resource Strain: Not on file  Food Insecurity: Not on file  Transportation Needs: Not on file  Physical Activity: Not on file  Stress: Not on file  Social Connections: Not on file     Family History:  The patient's  family history includes Heart attack in her father; Lung cancer in her father; Thyroid disease in her mother.   ROS:   Please see the history of present illness.    ROS All other  systems reviewed and are negative.   PHYSICAL EXAM:   VS:  BP (!) 116/52   Pulse (!) 53   Ht 5\' 2"  (1.575 m)   Wt 186 lb (84.4 kg)   SpO2 98%   BMI 34.02 kg/m   Physical Exam  GEN: Obese, in no acute distress  Neck: no JVD, carotid bruits, or masses Cardiac:RRR; no murmurs, rubs, or gallops  Respiratory:  clear to auscultation bilaterally, normal work of breathing GI: soft, nontender, nondistended, + BS Ext: without cyanosis, clubbing, or edema, Good distal pulses bilaterally Neuro:  Alert and Oriented x 3 Psych: euthymic mood, full affect  Wt Readings from Last 3 Encounters:  01/19/21 186 lb (84.4 kg)  09/22/20 183 lb 9.6 oz (83.3 kg)  09/21/20 184 lb (83.5 kg)      Studies/Labs Reviewed:   EKG:  EKG is not ordered today.     Recent Labs: 09/17/2020: B Natriuretic Peptide 131.6; BUN 11; Creatinine, Ser 0.87; Hemoglobin 12.3; Magnesium 2.1; Platelets 219; Potassium 3.8; Sodium 139 09/21/2020: TSH 2.29   Lipid Panel    Component Value Date/Time   CHOL 141 04/03/2019 0744   TRIG 48.0 04/03/2019 0744   HDL 68.50 04/03/2019 0744   CHOLHDL 2 04/03/2019 0744   VLDL 9.6 04/03/2019 0744   LDLCALC 63 04/03/2019 0744    Additional studies/ records that were reviewed today include:  Coronary CTA 10/05/2020 Other findings:   Left Ventricle: Normal size   Left Atrium: Normal size   Pulmonary Veins: Normal configuration   Right Ventricle: Normal size   Right Atrium: Mild enlargement   Cardiac valves: No calcifications   Thoracic aorta: Normal size   Pulmonary Arteries: Normal size   Systemic Veins: Normal drainage   Pericardium: Normal thickness   IMPRESSION: 1. Coronary calcium score of 11. This was 53rd percentile for age and sex matched control.   2. Normal coronary origin with right dominance.   3. Nonobstructive CAD.   4. Calcified plaque in the proximal LAD causes minimal (0-24%) stenosis   CAD-RADS 1. Minimal non-obstructive CAD (0-24%).  Consider non-atherosclerotic causes of chest pain. Consider preventive therapy and risk factor modification.   ZIO monitor 09/2020 Patch wear time was 3 days and 0 hours Predominant rhythm was NSR with average 66bpm (ranging from 47-133bpm) Rare SVE, rare VE (<1%) No Afib, sustained arrhythmias or significant pauses Overall, normal cardiac monitor     Patch Wear Time:  3 days and 0 hours (2022-07-17T09:58:02-399 to 2022-07-20T10:20:44-0400)   Patient had a min HR of 47 bpm, max HR  of 133 bpm, and avg HR of 66 bpm. Predominant underlying rhythm was Sinus Rhythm. Isolated SVEs were rare (<1.0%), SVE Couplets were rare (<1.0%), and no SVE Triplets were present. Isolated VEs were rare (<1.0%), VE  Couplets were rare (<1.0%), and no VE Triplets were present.    Gwyndolyn Kaufman, MD  2D echo 10/2020 IMPRESSIONS     1. Left ventricular ejection fraction, by estimation, is 60 to 65%. The  left ventricle has normal function. The left ventricle has no regional  wall motion abnormalities. Left ventricular diastolic parameters were  normal.   2. Right ventricular systolic function is normal. The right ventricular  size is normal. There is normal pulmonary artery systolic pressure.   3. The mitral valve is normal in structure. Trivial mitral valve  regurgitation. No evidence of mitral stenosis.   4. The aortic valve is normal in structure. Aortic valve regurgitation is  not visualized. No aortic stenosis is present.   5. The inferior vena cava is normal in size with greater than 50%  respiratory variability, suggesting right atrial pressure of 3 mmHg.     Risk Assessment/Calculations:         ASSESSMENT:    1. Bilateral lower extremity edema   2. Other fatigue   3. Coronary artery disease involving native coronary artery of native heart without angina pectoris   4. Family history of early CAD      PLAN:  In order of problems listed above:  Upper and lower extremity edema  question etiology most likely from the heat of the summer, now resolved.  Echo with normal LVEF and normal diastolic parameters  Fatigue with exertion Holter monitor normal, 2D echo normal and coronary CTA minimal nonobstructive CAD  Nonobstructive CAD on cardiac CT calcium score 11 risk factor modification.-consider statin. LDL 63 03/2019. To have it checked by PCP, 150 min exercise weekly  Family history of CAD-father died MI 66(MI in 17's)   Shared Decision Making/Informed Consent        Medication Adjustments/Labs and Tests Ordered: Current medicines are reviewed at length with the patient today.  Concerns regarding medicines are outlined above.  Medication changes, Labs and Tests ordered today are listed in the Patient Instructions below. Patient Instructions  Medication Instructions:  Your physician recommends that you continue on your current medications as directed. Please refer to the Current Medication list given to you today.  *If you need a refill on your cardiac medications before your next appointment, please call your pharmacy*   Lab Work: None If you have labs (blood work) drawn today and your tests are completely normal, you will receive your results only by: [place] (if you have MyChart) OR A paper copy in the mail If you have any lab test that is abnormal or we need to change your treatment, we will call you to review the results.   Follow-Up: At [facility], you and your health needs are our priority.  As part of our continuing mission to provide you with exceptional heart care, we have created designated Provider Care Teams.  These Care Teams include your primary Cardiologist (physician) and Advanced Practice Providers (APPs -  Physician Assistants and Nurse Practitioners) who all work together to provide you with the care you need, when you need it.   Your next appointment:   1 year(s)  The format for your next appointment:   In Person  Provider:    Freada Bergeron, MD {   Signed, Ermalinda Barrios,  PA-C  01/19/2021 10:23 AM    [facility] [place], [place], [place]  [address] Phone: [phone]; Fax: [phone]

## 2021-01-19 ENCOUNTER — Ambulatory Visit (INDEPENDENT_AMBULATORY_CARE_PROVIDER_SITE_OTHER): Payer: Medicare Other | Admitting: Physician Assistant

## 2021-01-19 ENCOUNTER — Encounter: Payer: Self-pay | Admitting: Physician Assistant

## 2021-01-19 ENCOUNTER — Other Ambulatory Visit: Payer: Self-pay

## 2021-01-19 VITALS — BP 116/52 | HR 53 | Ht 62.0 in | Wt 186.0 lb

## 2021-01-19 DIAGNOSIS — Z8249 Family history of ischemic heart disease and other diseases of the circulatory system: Secondary | ICD-10-CM | POA: Diagnosis not present

## 2021-01-19 DIAGNOSIS — R600 Localized edema: Secondary | ICD-10-CM

## 2021-01-19 DIAGNOSIS — I2510 Atherosclerotic heart disease of native coronary artery without angina pectoris: Secondary | ICD-10-CM

## 2021-01-19 DIAGNOSIS — R5383 Other fatigue: Secondary | ICD-10-CM | POA: Diagnosis not present

## 2021-01-19 NOTE — Patient Instructions (Signed)
Medication Instructions:  Your physician recommends that you continue on your current medications as directed. Please refer to the Current Medication list given to you today.  *If you need a refill on your cardiac medications before your next appointment, please call your pharmacy*   Lab Work: None If you have labs (blood work) drawn today and your tests are completely normal, you will receive your results only by: [place] (if you have MyChart) OR A paper copy in the mail If you have any lab test that is abnormal or we need to change your treatment, we will call you to review the results.   Follow-Up: At [facility], you and your health needs are our priority.  As part of our continuing mission to provide you with exceptional heart care, we have created designated Provider Care Teams.  These Care Teams include your primary Cardiologist (physician) and Advanced Practice Providers (APPs -  Physician Assistants and Nurse Practitioners) who all work together to provide you with the care you need, when you need it.   Your next appointment:   1 year(s)  The format for your next appointment:   In Person  Provider:   Freada Bergeron, MD {

## 2021-02-15 ENCOUNTER — Other Ambulatory Visit: Payer: Self-pay | Admitting: Internal Medicine

## 2021-02-26 IMAGING — MG DIGITAL SCREENING BILAT W/ TOMO W/ CAD
6 of 10 series · 6 of 30 positions shown · non-contrast
Comparison: Previous exam(s).

ACR Breast Density Category a: The breast tissue is almost entirely
fatty.

CLINICAL DATA: Screening.

EXAM:
DIGITAL SCREENING BILATERAL MAMMOGRAM WITH TOMO AND CAD

[L MLO synth-2D]
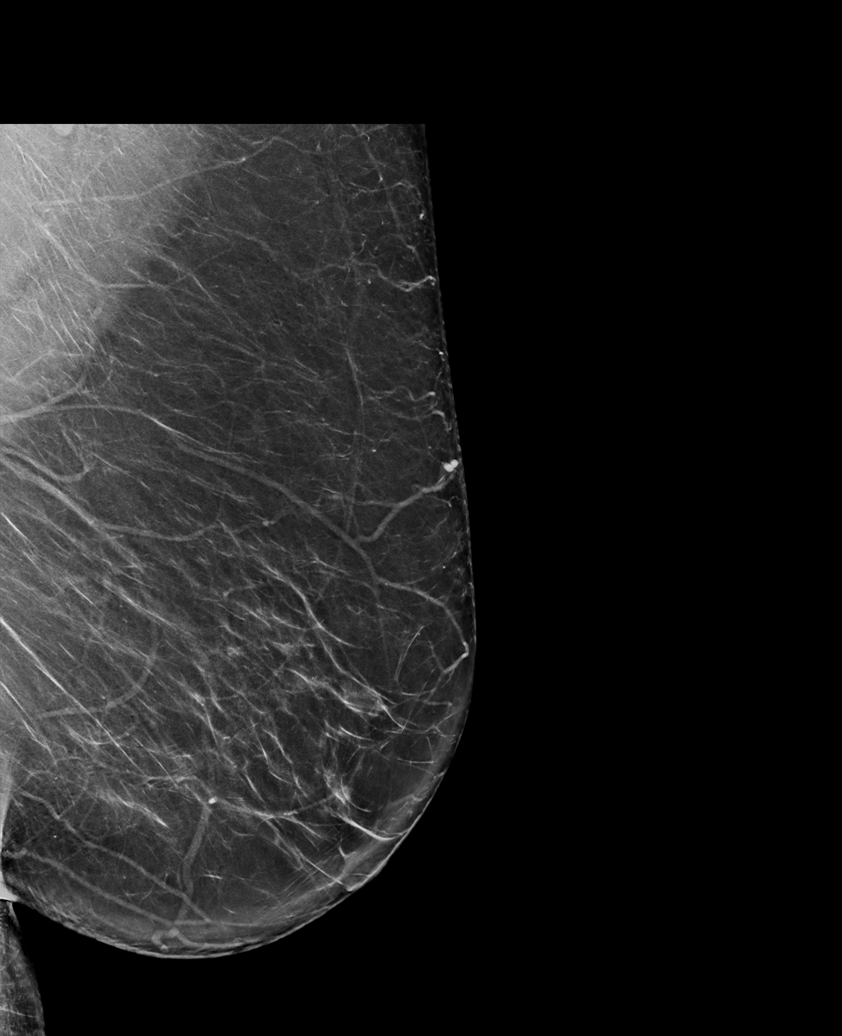

[R MLO synth-2D]
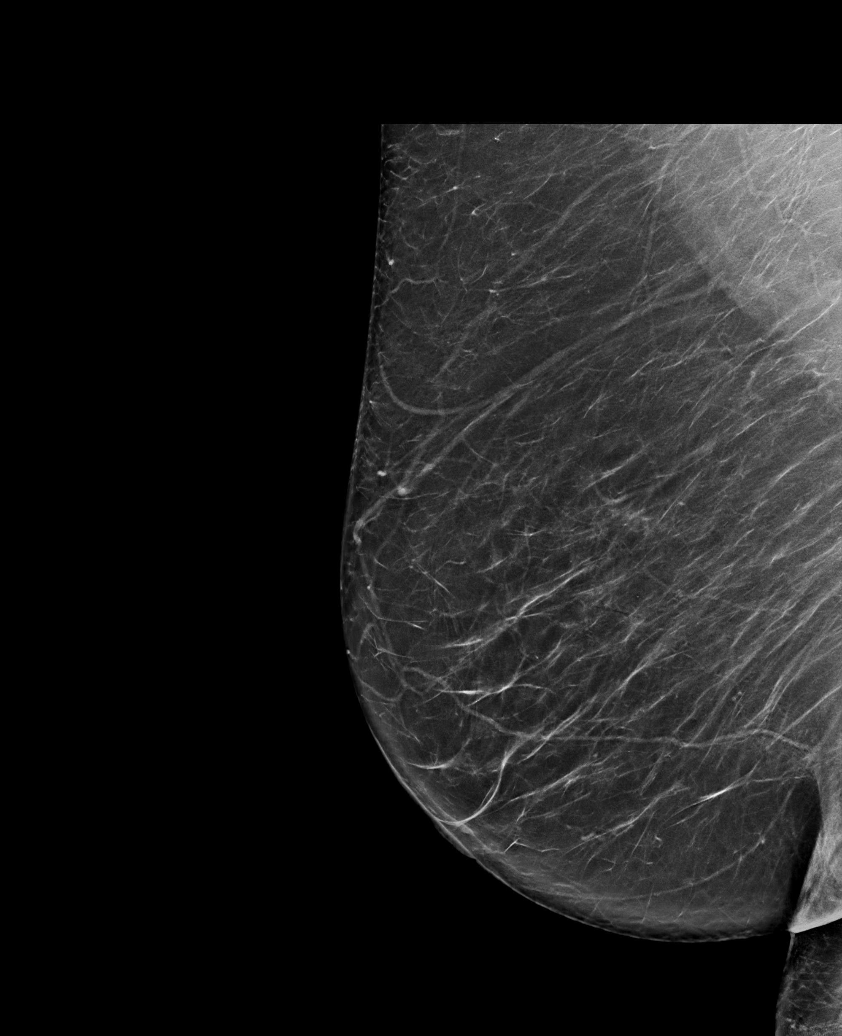

[L CV synth-2D]
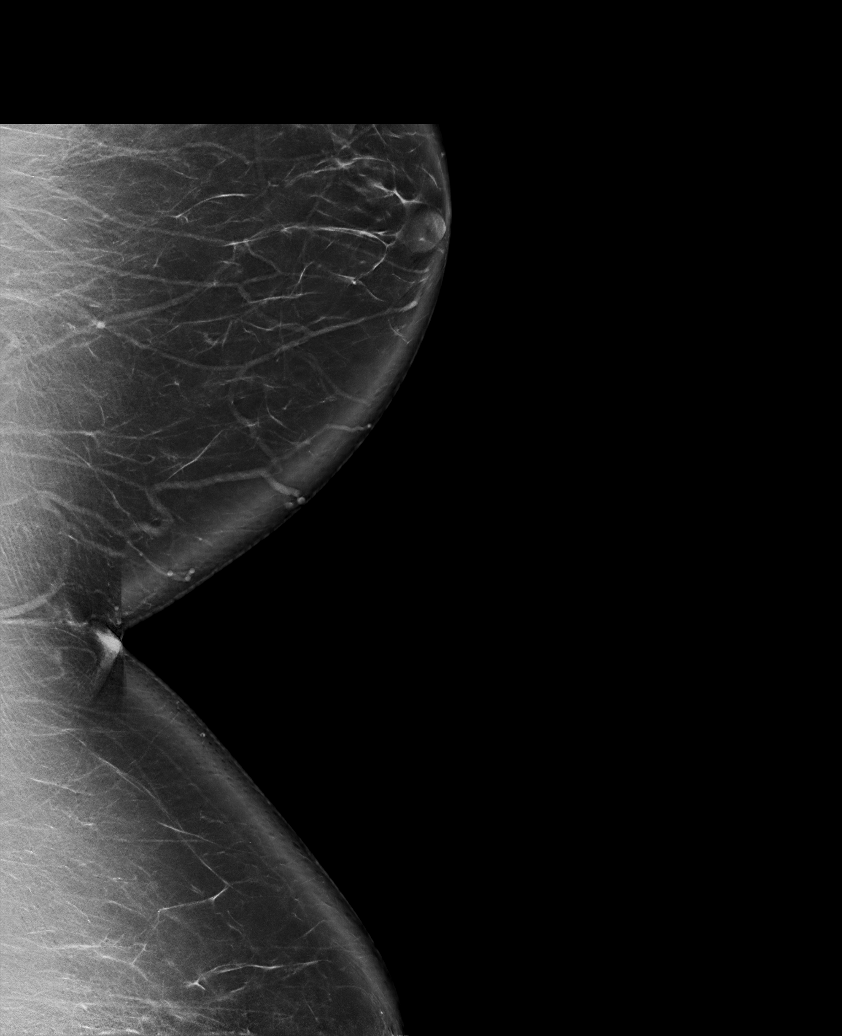

[R CC synth-2D]
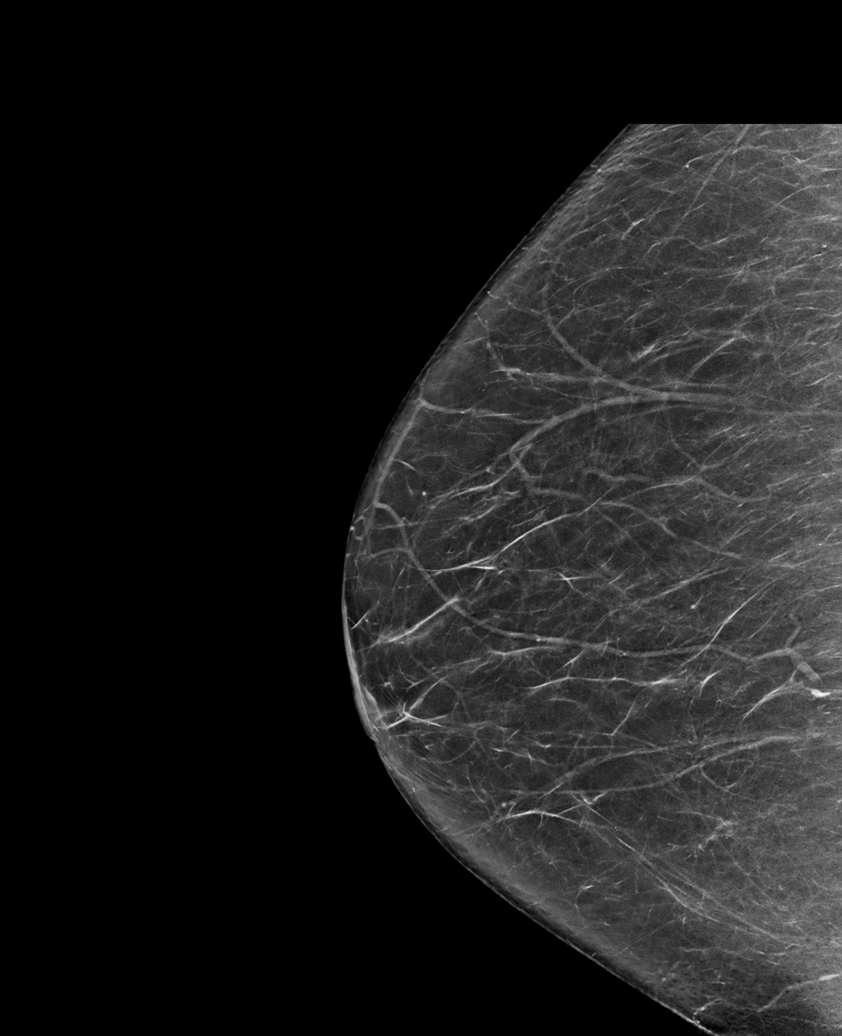

[L CC synth-2D]
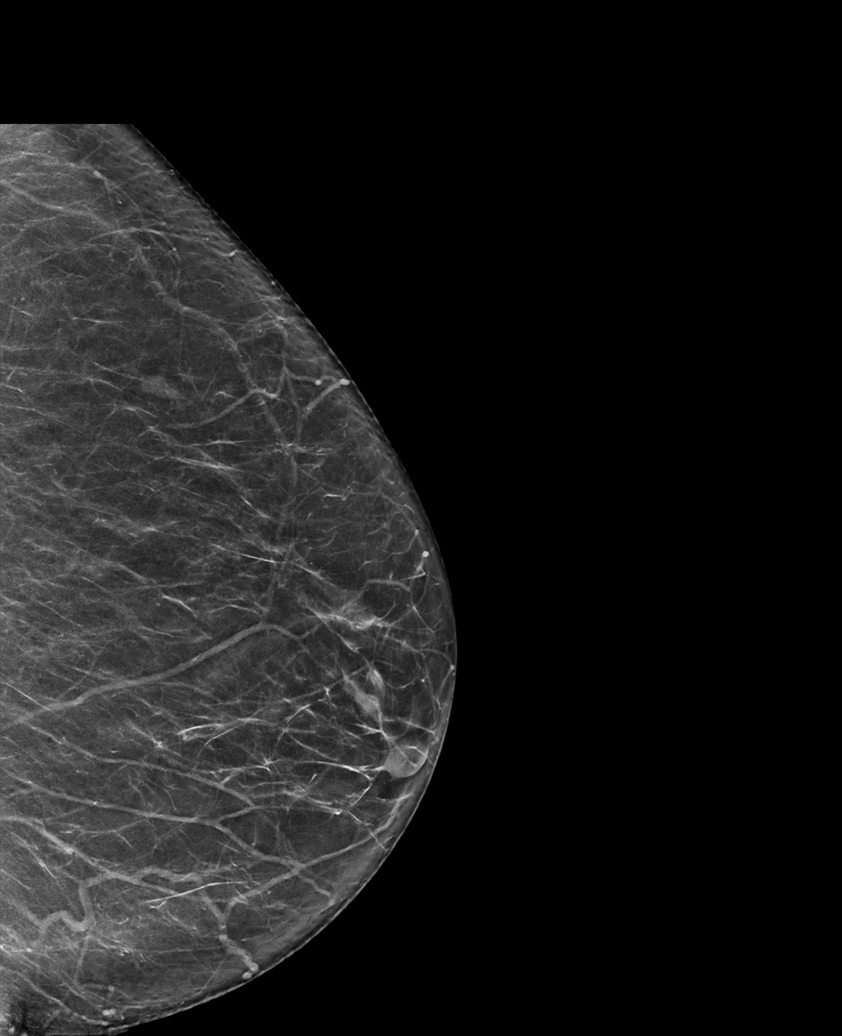

[L CC tomo · tomo slice 35/69.0]
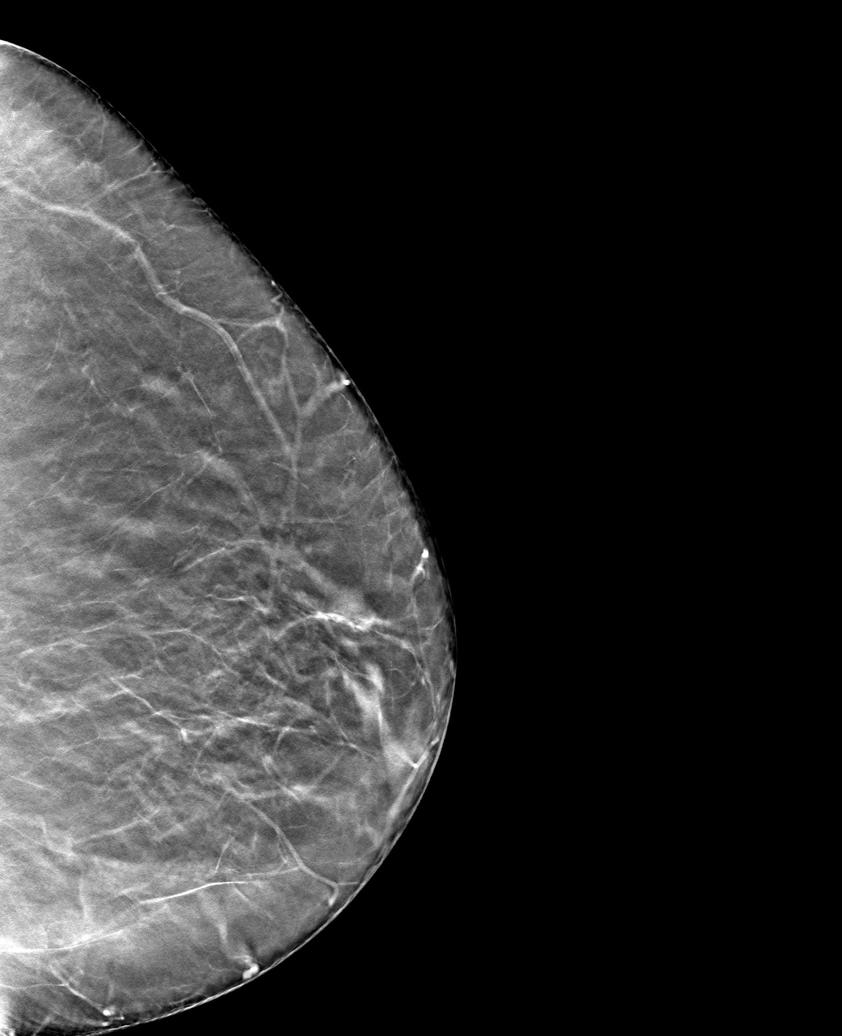

[6 of 30 positions shown; findings below may reference images not displayed]

FINDINGS: There are no findings suspicious for malignancy. Images were
processed with CAD.
IMPRESSION: No mammographic evidence of malignancy. A result letter of this
screening mammogram will be mailed directly to the patient.

RECOMMENDATION:
Screening mammogram in one year. (Code:[id number])

BI-RADS CATEGORY  1: Negative.

## 2021-03-16 ENCOUNTER — Encounter: Payer: Self-pay | Admitting: Internal Medicine

## 2021-03-16 ENCOUNTER — Ambulatory Visit (INDEPENDENT_AMBULATORY_CARE_PROVIDER_SITE_OTHER): Payer: Medicare Other | Admitting: Internal Medicine

## 2021-03-16 VITALS — BP 128/80 | HR 70 | Temp 98.0°F | Ht 62.0 in | Wt 185.1 lb

## 2021-03-16 DIAGNOSIS — E559 Vitamin D deficiency, unspecified: Secondary | ICD-10-CM | POA: Diagnosis not present

## 2021-03-16 DIAGNOSIS — F411 Generalized anxiety disorder: Secondary | ICD-10-CM | POA: Diagnosis not present

## 2021-03-16 DIAGNOSIS — D510 Vitamin B12 deficiency anemia due to intrinsic factor deficiency: Secondary | ICD-10-CM

## 2021-03-16 DIAGNOSIS — E039 Hypothyroidism, unspecified: Secondary | ICD-10-CM | POA: Diagnosis not present

## 2021-03-16 DIAGNOSIS — E042 Nontoxic multinodular goiter: Secondary | ICD-10-CM

## 2021-03-16 DIAGNOSIS — R0989 Other specified symptoms and signs involving the circulatory and respiratory systems: Secondary | ICD-10-CM | POA: Diagnosis not present

## 2021-03-16 DIAGNOSIS — Z0000 Encounter for general adult medical examination without abnormal findings: Secondary | ICD-10-CM

## 2021-03-16 DIAGNOSIS — U071 COVID-19: Secondary | ICD-10-CM | POA: Diagnosis not present

## 2021-03-16 DIAGNOSIS — K219 Gastro-esophageal reflux disease without esophagitis: Secondary | ICD-10-CM

## 2021-03-16 LAB — LIPID PANEL
Cholesterol: 149 mg/dL (ref 0–200)
HDL: 64.4 mg/dL (ref >39.00)
LDL Cholesterol: 72 mg/dL (ref 0–99)
NonHDL: 84.41
Total CHOL/HDL Ratio: 2
Triglycerides: 63 mg/dL (ref 0.0–149.0)
VLDL: 12.6 mg/dL (ref 0.0–40.0)

## 2021-03-16 LAB — COMPREHENSIVE METABOLIC PANEL
ALT: 21 U/L (ref 0–35)
AST: 22 U/L (ref 0–37)
Albumin: 4.1 g/dL (ref 3.5–5.2)
Alkaline Phosphatase: 57 U/L (ref 39–117)
BUN: 20 mg/dL (ref 6–23)
CO2: 28 mEq/L (ref 19–32)
Calcium: 9.6 mg/dL (ref 8.4–10.5)
Chloride: 106 mEq/L (ref 96–112)
Creatinine, Ser: 0.95 mg/dL (ref 0.40–1.20)
GFR: 61.21 mL/min (ref >60.00)
Glucose, Bld: 91 mg/dL (ref 70–99)
Potassium: 4.2 mEq/L (ref 3.5–5.1)
Sodium: 140 mEq/L (ref 135–145)
Total Bilirubin: 0.7 mg/dL (ref 0.2–1.2)
Total Protein: 6.8 g/dL (ref 6.0–8.3)

## 2021-03-16 LAB — CBC WITH DIFFERENTIAL/PLATELET
Basophils Absolute: 0 10*3/uL (ref 0.0–0.1)
Basophils Relative: 0.8 % (ref 0.0–3.0)
Eosinophils Absolute: 0.1 10*3/uL (ref 0.0–0.7)
Eosinophils Relative: 2.6 % (ref 0.0–5.0)
HCT: 40.7 % (ref 36.0–46.0)
Hemoglobin: 13.5 g/dL (ref 12.0–15.0)
Lymphocytes Relative: 37 % (ref 12.0–46.0)
Lymphs Abs: 1.7 10*3/uL (ref 0.7–4.0)
MCHC: 33.1 g/dL (ref 30.0–36.0)
MCV: 88.9 fl (ref 78.0–100.0)
Monocytes Absolute: 0.4 10*3/uL (ref 0.1–1.0)
Monocytes Relative: 8.6 % (ref 3.0–12.0)
Neutro Abs: 2.4 10*3/uL (ref 1.4–7.7)
Neutrophils Relative %: 51 % (ref 43.0–77.0)
Platelets: 262 10*3/uL (ref 150.0–400.0)
RBC: 4.58 Mil/uL (ref 3.87–5.11)
RDW: 13.9 % (ref 11.5–15.5)
WBC: 4.7 10*3/uL (ref 4.0–10.5)

## 2021-03-16 LAB — VITAMIN D 25 HYDROXY (VIT D DEFICIENCY, FRACTURES): VITD: 35.35 ng/mL (ref 30.00–100.00)

## 2021-03-16 LAB — VITAMIN B12: Vitamin B-12: >1550 pg/mL — ABNORMAL HIGH (ref 211–911)

## 2021-03-16 MED ORDER — SERTRALINE HCL 25 MG PO TABS: 25.0000 mg | ORAL_TABLET | Freq: Every day | ORAL | 1 refills | Status: DC

## 2021-03-16 MED ORDER — PANTOPRAZOLE SODIUM 40 MG PO TBEC: 40.0000 mg | DELAYED_RELEASE_TABLET | Freq: Two times a day (BID) | ORAL | 1 refills | Status: DC

## 2021-03-16 NOTE — Progress Notes (Signed)
Established Patient Office Visit     This visit occurred during the SARS-CoV-2 public health emergency.  Safety protocols were in place, including screening questions prior to the visit, additional usage of staff PPE, and extensive cleaning of exam room while observing appropriate contact time as indicated for disinfecting solutions.    CC/Reason for Visit: Annual preventive exam, discuss acute concerns  HPI: Jaime Fuller is a 71 y.o. female who is coming in today for the above mentioned reasons. Past Medical History is significant for: Pernicious anemia, generalized anxiety disorder, hypothyroidism and multinodular goiter.  She follows with Dr. Loanne Drilling for her thyroid issues.  Her recent TSH was within range and there is plan for an annual ultrasound to follow-up on her nodules.  She tells me she is recovering from a COVID infection, she tested positive on December 22, she has been doing well.  She had a back surgery in February with Dr. Ellene Route and has done well.  She has been complaining of increased anxiety.  Reflux continues to be her main issue.  She follows with Dr. Tarri Glenn.  She is currently on omeprazole 40 mg twice daily, Pepcid 20 mg twice daily, Carafate, she will sometimes take an extra omeprazole dose midday.  She has routine eye and dental care, she has bilateral hearing aids, she had a flu vaccine but not yet her COVID booster.   Past Medical/Surgical History: Past Medical History:  Diagnosis Date   Anxiety state, unspecified 09/20/2007   Arthritis    Cataract    Chronic kidney disease    kidney stones   HYPOTHYROIDISM 03/08/2007   no meds needed now 04-15-19   Pancreatitis    secondary to ERCP    Past Surgical History:  Procedure Laterality Date   abdominal plasty     Carpel tunnel surgery left hand     Childbirth x 2     CHOLECYSTECTOMY     GASTRIC BYPASS     UPPER GASTROINTESTINAL ENDOSCOPY     WISDOM TOOTH EXTRACTION      Social History:  reports that  she has quit smoking. She has never used smokeless tobacco. She reports that she does not drink alcohol and does not use drugs.  Allergies: Allergies  Allergen Reactions   Bee Venom Anaphylaxis   Vicodin [Hydrocodone-Acetaminophen] Itching    Family History:  Family History  Problem Relation Age of Onset   Thyroid disease Mother    Lung cancer Father    Heart attack Father        Died at 42   Colon cancer Neg Hx    Esophageal cancer Neg Hx    Rectal cancer Neg Hx    Stomach cancer Neg Hx      Current Outpatient Medications:    calcium carbonate (TUMS EX) 750 MG chewable tablet, Chew 1-3 tablets by mouth as needed for heartburn., Disp: , Rfl:    clonazePAM (KLONOPIN) 0.5 MG tablet, Take 1 tablet (0.5 mg total) by mouth 2 (two) times daily as needed for anxiety., Disp: 30 tablet, Rfl: 1   EPINEPHrine 0.3 mg/0.3 mL IJ SOAJ injection, Inject 0.3 mLs (0.3 mg total) into the muscle as needed for anaphylaxis., Disp: 1 each, Rfl: 1   famotidine (PEPCID) 20 MG tablet, Take 1 tablet (20 mg total) by mouth 2 (two) times daily., Disp: 180 tablet, Rfl: 3   furosemide (LASIX) 20 MG tablet, Take 20 mg by mouth three times weekly as needed for lower extremity swelling., Disp:  45 tablet, Rfl: 1   levothyroxine (SYNTHROID) 50 MCG tablet, TAKE 1 TABLET DAILY, Disp: 90 tablet, Rfl: 3   pantoprazole (PROTONIX) 40 MG tablet, Take 1 tablet (40 mg total) by mouth 2 (two) times daily., Disp: 180 tablet, Rfl: 1   sertraline (ZOLOFT) 25 MG tablet, Take 1 tablet (25 mg total) by mouth daily., Disp: 90 tablet, Rfl: 1   sucralfate (CARAFATE) 1 GM/10ML suspension, Take 10 mLs (1 g total) by mouth 4 (four) times daily., Disp: 420 mL, Rfl: 1   valACYclovir (VALTREX) 1000 MG tablet, ONE TABLET TWICE A DAY X 5 DAYS AS NEEDED FOR OUTBREAK, Disp: 50 tablet, Rfl: 1  Review of Systems:  Constitutional: Denies fever, chills, diaphoresis, appetite change and fatigue.  HEENT: Denies photophobia, eye pain, redness,  hearing loss, ear pain, congestion, sore throat, rhinorrhea, sneezing, mouth sores, trouble swallowing, neck pain, neck stiffness and tinnitus.   Respiratory: Denies SOB, DOE, cough, chest tightness,  and wheezing.   Cardiovascular: Denies chest pain, palpitations and leg swelling.  Gastrointestinal: Denies nausea, vomiting, abdominal pain, diarrhea, constipation, blood in stool and abdominal distention.  Genitourinary: Denies dysuria, urgency, frequency, hematuria, flank pain and difficulty urinating.  Endocrine: Denies: hot or cold intolerance, sweats, changes in hair or nails, polyuria, polydipsia. Musculoskeletal: Denies myalgias, back pain, joint swelling, arthralgias and gait problem.  Skin: Denies pallor, rash and wound.  Neurological: Denies dizziness, seizures, syncope, weakness, light-headedness, numbness and headaches.  Hematological: Denies adenopathy. Easy bruising, personal or family bleeding history  Psychiatric/Behavioral: Denies suicidal ideation, mood changes, confusion, nervousness, sleep disturbance and agitation    Physical Exam: Vitals:   03/16/21 0658  BP: 128/80  Pulse: 70  Temp: 98 F (36.7 C)  TempSrc: Oral  SpO2: 97%  Weight: 185 lb 1 oz (83.9 kg)  Height: 5\' 2"  (1.575 m)    Body mass index is 33.85 kg/m.   Constitutional: NAD, calm, comfortable Eyes: PERRL, lids and conjunctivae normal ENMT: Mucous membranes are moist. Posterior pharynx clear of any exudate or lesions. Normal dentition. Tympanic membrane is pearly white, no erythema or bulging. Neck: normal, supple, no masses, no thyromegaly Respiratory: clear to auscultation bilaterally, no wheezing, no crackles. Normal respiratory effort. No accessory muscle use.  Cardiovascular: Regular rate and rhythm, no murmurs / rubs / gallops. No extremity edema. 2+ pedal pulses. No carotid bruits.  Abdomen: no tenderness, no masses palpated. No hepatosplenomegaly. Bowel sounds positive.  Musculoskeletal: no  clubbing / cyanosis. No joint deformity upper and lower extremities. Good ROM, no contractures. Normal muscle tone.  Skin: no rashes, lesions, ulcers. No induration Neurologic: CN 2-12 grossly intact. Sensation intact, DTR normal. Strength 5/5 in all 4.  Psychiatric: Normal judgment and insight. Alert and oriented x 3. Normal mood.    Subsequent Medicare wellness visit   1. Risk factors, based on past  M,S,F -cardiovascular disease risk factors include age.   2.  Physical activities: Very sedentary other than activities of daily living   3.  Depression/mood: Mood is stable, not depressed, she does demonstrate some increased anxiety per her report   4.  Hearing: Chronic bilateral hearing aid use   5.  ADL's: Independent in all ADLs   6.  Fall risk: Low fall risk   7.  Home safety: No problems identified   8.  Height weight, and visual acuity: height and weight as above, vision:  20/25 bilateral with correction   9.  Counseling: Advise she update her COVID booster   10. Lab orders based  on risk factors: Laboratory update will be reviewed   11. Referral : None today   12. Care plan: Follow-up with me in 8 to 12 weeks   13. Cognitive assessment: No cognitive impairment   14. Screening: Patient provided with a written and personalized 5-10 year screening schedule in the AVS. yes   15. Provider List Update: PCP, cardiology, endocrinology, neurosurgery  16. Advance Directives: Full code   17. Opioids: Patient is not on any opioid prescriptions and has no risk factors for a substance use disorder.   [place] Office Visit from 03/16/2021 in [facility] [place]  PHQ-9 Total Score 8       Fall Risk 01/06/2020 09/17/2020 09/17/2020 03/16/2021 03/16/2021  Falls in the past year? 1 - - 0 0  Number of falls in past year - - - - -  Was there an injury with Fall? 0 - - - 0  Fall Risk Category Calculator 1 - - - 0  Fall Risk Category Low - - - Low  Patient Fall Risk  Level - Low fall risk Low fall risk Low fall risk Low fall risk  Patient at Risk for Falls Due to - - - - No Fall Risks  Fall risk Follow up - - - - Falls evaluation completed     Impression and Plan:  Encounter for preventive health examination -Recommend routine eye and dental care. -Immunizations: All immunizations are up-to-date with the exception of her COVID booster which he has been advised to get at pharmacy. -Healthy lifestyle discussed in detail. -Labs to be updated today. -Colon cancer screening: 05/2019 -Breast cancer screening: 05/2020 -Cervical cancer screening: 03/2019 -Lung cancer screening: Not applicable -Prostate cancer screening: Not applicable -DEXA: 03/173  Hypothyroidism, unspecified type  Multinodular goiter -Follows with endocrinology with plans for annual ultrasounds. -TSH 2.290 in July.  Pernicious anemia  - Plan: Vitamin B12, Vitamin B12  Vitamin D deficiency  - Plan: VITAMIN D 25 Hydroxy (Vit-D Deficiency, Fractures), VITAMIN D 25 Hydroxy (Vit-D Deficiency, Fractures)  GAD (generalized anxiety disorder)  - Plan: sertraline (ZOLOFT) 25 MG tablet -She will follow-up in 8 to 12 weeks to see if any improvement.  Gastroesophageal reflux disease, unspecified whether esophagitis present  - Plan: pantoprazole (PROTONIX) 40 MG tablet -Continues to be a big concern for her. -We discussed transitioning from omeprazole to pantoprazole to see if this provides any relief, she will follow with Dr. Tarri Glenn as needed.  She will also continue Carafate 4 times a day and Pepcid twice a day.    Patient Instructions  -Nice seeing you today!!  -Lab work today; will notify you once results are available.  -Start zoloft 25 mg at bedtime.  -Stop omeprazole. Start pantoprazole 40 mg twice daily.  -Schedule follow up in 8-12 weeks.      Lelon Frohlich, MD [facility]

## 2021-03-16 NOTE — Patient Instructions (Signed)
-  Nice seeing you today!!  -Lab work today; will notify you once results are available.  -Start zoloft 25 mg at bedtime.  -Stop omeprazole. Start pantoprazole 40 mg twice daily.  -Schedule follow up in 8-12 weeks.

## 2021-04-08 ENCOUNTER — Telehealth: Payer: Self-pay | Admitting: Endocrinology

## 2021-04-08 NOTE — Telephone Encounter (Signed)
Provider informed patient that Ultrasound needed to be done prior to appt.

## 2021-06-02 ENCOUNTER — Telehealth: Payer: Self-pay

## 2021-06-02 NOTE — Telephone Encounter (Signed)
Patient called in stating that she didn't see the point in moving appt up sooner per FO calling telling her too. Because she still hasn't had the ultrasound that Dr. Loanne Drilling was supposed to order. Patient is asking that we order the [place] so she can come to her appt and not have to miss seeing Dr before his last day. ? ? ?Please call and advise  ?

## 2021-06-14 ENCOUNTER — Ambulatory Visit (INDEPENDENT_AMBULATORY_CARE_PROVIDER_SITE_OTHER): Payer: Medicare Other | Admitting: Endocrinology

## 2021-06-14 ENCOUNTER — Encounter: Payer: Self-pay | Admitting: Endocrinology

## 2021-06-14 VITALS — BP 102/68 | HR 59 | Ht 62.0 in | Wt 189.2 lb

## 2021-06-14 DIAGNOSIS — E042 Nontoxic multinodular goiter: Secondary | ICD-10-CM

## 2021-06-14 LAB — TSH: TSH: 5.14 u[IU]/mL (ref 0.35–5.50)

## 2021-06-14 LAB — T4, FREE: Free T4: 0.85 ng/dL (ref 0.60–1.60)

## 2021-06-14 NOTE — Progress Notes (Signed)
? ?Subjective:  ? ? Patient ID: Jaime Fuller, female    DOB: Apr 10, 1950, 71 y.o.   MRN: [id number] ? ?HPI ?Pt returns for f/u of [place] (dx'ed 2022; she took synthroid for uncertain reason [id number]; since then, she has been euthyroid off rx; [place] (2022) showed MNG, with bx advised of RML 1.6 cm  nodule; bx cytol showed Atypia of undetermined significance (category III); Afirma: unable to evaluate; synthroid was resumed in 2021, due to elev TSH).  She does not notice the goiter.  pt states fatigue.   ?Past Medical History:  ?Diagnosis Date  ? Anxiety state, unspecified 09/20/2007  ? Arthritis   ? Cataract   ? Chronic kidney disease   ? kidney stones  ? HYPOTHYROIDISM 03/08/2007  ? no meds needed now 04-15-19  ? Pancreatitis   ? secondary to ERCP  ? ? ?Past Surgical History:  ?Procedure Laterality Date  ? abdominal plasty    ? Carpel tunnel surgery left hand    ? Childbirth x 2    ? CHOLECYSTECTOMY    ? GASTRIC BYPASS    ? UPPER GASTROINTESTINAL ENDOSCOPY    ? WISDOM TOOTH EXTRACTION    ? ? ?Social History  ? ?Socioeconomic History  ? Marital status: Married  ?  Spouse name: Not on file  ? Number of children: Not on file  ? Years of education: Not on file  ? Highest education level: Not on file  ?Occupational History  ? Not on file  ?Tobacco Use  ? Smoking status: Former  ? Smokeless tobacco: Never  ?Vaping Use  ? Vaping Use: Never used  ?Substance and Sexual Activity  ? Alcohol use: No  ?  Alcohol/week: 0.0 standard drinks  ? Drug use: No  ? Sexual activity: Not on file  ?Other Topics Concern  ? Not on file  ?Social History Narrative  ? Not on file  ? ?Social Determinants of Health  ? ?Financial Resource Strain: Not on file  ?Food Insecurity: Not on file  ?Transportation Needs: Not on file  ?Physical Activity: Not on file  ?Stress: Not on file  ?Social Connections: Not on file  ?Intimate Partner Violence: Not on file  ? ? ?Current Outpatient Medications on File Prior to Visit  ?Medication Sig Dispense Refill  ? calcium  carbonate (TUMS EX) 750 MG chewable tablet Chew 1-3 tablets by mouth as needed for heartburn.    ? clonazePAM (KLONOPIN) 0.5 MG tablet Take 1 tablet (0.5 mg total) by mouth 2 (two) times daily as needed for anxiety. 30 tablet 1  ? EPINEPHrine 0.3 mg/0.3 mL IJ SOAJ injection Inject 0.3 mLs (0.3 mg total) into the muscle as needed for anaphylaxis. 1 each 1  ? famotidine (PEPCID) 20 MG tablet Take 1 tablet (20 mg total) by mouth 2 (two) times daily. 180 tablet 3  ? furosemide (LASIX) 20 MG tablet Take 20 mg by mouth three times weekly as needed for lower extremity swelling. 45 tablet 1  ? levothyroxine (SYNTHROID) 50 MCG tablet TAKE 1 TABLET DAILY 90 tablet 3  ? pantoprazole (PROTONIX) 40 MG tablet Take 1 tablet (40 mg total) by mouth 2 (two) times daily. 180 tablet 1  ? sertraline (ZOLOFT) 25 MG tablet Take 1 tablet (25 mg total) by mouth daily. 90 tablet 1  ? sucralfate (CARAFATE) 1 GM/10ML suspension Take 10 mLs (1 g total) by mouth 4 (four) times daily. 420 mL 1  ? valACYclovir (VALTREX) 1000 MG tablet ONE TABLET TWICE A DAY X  5 DAYS AS NEEDED FOR OUTBREAK 50 tablet 1  ? ?No current facility-administered medications on file prior to visit.  ? ? ?Allergies  ?Allergen Reactions  ? Bee Venom Anaphylaxis  ? Vicodin [Hydrocodone-Acetaminophen] Itching  ? ? ?Family History  ?Problem Relation Age of Onset  ? Thyroid disease Mother   ? Lung cancer Father   ? Heart attack Father   ?     Died at 48  ? Colon cancer Neg Hx   ? Esophageal cancer Neg Hx   ? Rectal cancer Neg Hx   ? Stomach cancer Neg Hx   ? ? ?BP 102/68 (BP Location: Left Arm, Patient Position: Sitting, Cuff Size: Normal)   Pulse (!) 59   Ht '5\' 2"'$  (1.575 m)   Wt 189 lb 3.2 oz (85.8 kg)   SpO2 97%   BMI 34.61 kg/m?  ? ?Review of Systems ? ?   ?Objective:  ? Physical Exam ?VITAL SIGNS:  See vs page.   ?GENERAL: no distress.   ?THYROID: 2-3x normal size, with MNG surface.   ? ? ?Lab Results  ?Component Value Date  ? TSH 5.14 06/14/2021  ? ?   ?Assessment &  Plan:  ?Hypothyroidism: well-controlled ?MNG: I requested recheck ? ?

## 2021-06-14 NOTE — Patient Instructions (Signed)
Thyroid blood tests are requested for you today.  We'll let you know about the results.   ?Please come back for a follow-up appointment in 1 year.   ?

## 2021-06-15 ENCOUNTER — Ambulatory Visit
Admission: RE | Admit: 2021-06-15 | Discharge: 2021-06-15 | Disposition: A | Discharge status: Home / Self Care | Payer: Medicare Other | Source: Ambulatory Visit | Attending: Endocrinology | Admitting: Endocrinology

## 2021-06-15 ENCOUNTER — Encounter: Payer: Self-pay | Admitting: Internal Medicine

## 2021-06-15 ENCOUNTER — Ambulatory Visit (INDEPENDENT_AMBULATORY_CARE_PROVIDER_SITE_OTHER): Payer: Medicare Other | Admitting: Internal Medicine

## 2021-06-15 VITALS — BP 112/62 | HR 60 | Temp 97.9°F | Ht 62.0 in | Wt 191.7 lb

## 2021-06-15 DIAGNOSIS — E042 Nontoxic multinodular goiter: Secondary | ICD-10-CM

## 2021-06-15 DIAGNOSIS — F411 Generalized anxiety disorder: Secondary | ICD-10-CM | POA: Diagnosis not present

## 2021-06-15 NOTE — Progress Notes (Signed)
? ? ? ?Established Patient Office Visit ? ? ? ? ?This visit occurred during the SARS-CoV-2 public health emergency.  Safety protocols were in place, including screening questions prior to the visit, additional usage of staff PPE, and extensive cleaning of exam room while observing appropriate contact time as indicated for disinfecting solutions.  ? ? ?CC/Reason for Visit: Follow-up anxiety ? ?HPI: Jaime Fuller is a 71 y.o. female who is coming in today for the above mentioned reasons.  At last visit she was started on sertraline 25 mg due to anxiety.  She feels like she is tolerating this well. ? ?Past Medical/Surgical History: ?Past Medical History:  ?Diagnosis Date  ? Anxiety state, unspecified 09/20/2007  ? Arthritis   ? Cataract   ? Chronic kidney disease   ? kidney stones  ? HYPOTHYROIDISM 03/08/2007  ? no meds needed now 04-15-19  ? Pancreatitis   ? secondary to ERCP  ? ? ?Past Surgical History:  ?Procedure Laterality Date  ? abdominal plasty    ? Carpel tunnel surgery left hand    ? Childbirth x 2    ? CHOLECYSTECTOMY    ? GASTRIC BYPASS    ? UPPER GASTROINTESTINAL ENDOSCOPY    ? WISDOM TOOTH EXTRACTION    ? ? ?Social History: ? reports that she has quit smoking. She has never used smokeless tobacco. She reports that she does not drink alcohol and does not use drugs. ? ?Allergies: ?Allergies  ?Allergen Reactions  ? Bee Venom Anaphylaxis  ? Vicodin [Hydrocodone-Acetaminophen] Itching  ? ? ?Family History:  ?Family History  ?Problem Relation Age of Onset  ? Thyroid disease Mother   ? Lung cancer Father   ? Heart attack Father   ?     Died at 65  ? Colon cancer Neg Hx   ? Esophageal cancer Neg Hx   ? Rectal cancer Neg Hx   ? Stomach cancer Neg Hx   ? ? ? ?Current Outpatient Medications:  ?  calcium carbonate (TUMS EX) 750 MG chewable tablet, Chew 1-3 tablets by mouth as needed for heartburn., Disp: , Rfl:  ?  clonazePAM (KLONOPIN) 0.5 MG tablet, Take 1 tablet (0.5 mg total) by mouth 2 (two) times daily as needed  for anxiety., Disp: 30 tablet, Rfl: 1 ?  EPINEPHrine 0.3 mg/0.3 mL IJ SOAJ injection, Inject 0.3 mLs (0.3 mg total) into the muscle as needed for anaphylaxis., Disp: 1 each, Rfl: 1 ?  famotidine (PEPCID) 20 MG tablet, Take 1 tablet (20 mg total) by mouth 2 (two) times daily., Disp: 180 tablet, Rfl: 3 ?  furosemide (LASIX) 20 MG tablet, Take 20 mg by mouth three times weekly as needed for lower extremity swelling., Disp: 45 tablet, Rfl: 1 ?  levothyroxine (SYNTHROID) 50 MCG tablet, TAKE 1 TABLET DAILY, Disp: 90 tablet, Rfl: 3 ?  pantoprazole (PROTONIX) 40 MG tablet, Take 1 tablet (40 mg total) by mouth 2 (two) times daily., Disp: 180 tablet, Rfl: 1 ?  sertraline (ZOLOFT) 25 MG tablet, Take 1 tablet (25 mg total) by mouth daily., Disp: 90 tablet, Rfl: 1 ?  sucralfate (CARAFATE) 1 GM/10ML suspension, Take 10 mLs (1 g total) by mouth 4 (four) times daily., Disp: 420 mL, Rfl: 1 ?  valACYclovir (VALTREX) 1000 MG tablet, ONE TABLET TWICE A DAY X 5 DAYS AS NEEDED FOR OUTBREAK, Disp: 50 tablet, Rfl: 1 ? ?Review of Systems:  ?Constitutional: Denies fever, chills, diaphoresis, appetite change and fatigue.  ?HEENT: Denies photophobia, eye pain, redness, hearing  loss, ear pain, congestion, sore throat, rhinorrhea, sneezing, mouth sores, trouble swallowing, neck pain, neck stiffness and tinnitus.   ?Respiratory: Denies SOB, DOE, cough, chest tightness,  and wheezing.   ?Cardiovascular: Denies chest pain, palpitations and leg swelling.  ?Gastrointestinal: Denies nausea, vomiting, abdominal pain, diarrhea, constipation, blood in stool and abdominal distention.  ?Genitourinary: Denies dysuria, urgency, frequency, hematuria, flank pain and difficulty urinating.  ?Endocrine: Denies: hot or cold intolerance, sweats, changes in hair or nails, polyuria, polydipsia. ?Musculoskeletal: Denies myalgias, back pain, joint swelling, arthralgias and gait problem.  ?Skin: Denies pallor, rash and wound.  ?Neurological: Denies dizziness, seizures,  syncope, weakness, light-headedness, numbness and headaches.  ?Hematological: Denies adenopathy. Easy bruising, personal or family bleeding history  ?Psychiatric/Behavioral: Denies suicidal ideation, mood changes, confusion, nervousness, sleep disturbance and agitation ? ? ? ?Physical Exam: ?Vitals:  ? 06/15/21 0908  ?BP: 112/62  ?Pulse: 60  ?Temp: 97.9 ?F (36.6 ?C)  ?TempSrc: Oral  ?SpO2: 98%  ?Weight: 191 lb 11.2 oz (87 kg)  ?Height: '5\' 2"'$  (1.575 m)  ? ? ?Body mass index is 35.06 kg/m?. ? ? ?Constitutional: NAD, calm, comfortable ?Eyes: PERRL, lids and conjunctivae normal, wears corrective lenses ?ENMT: Mucous membranes are moist.  ?Respiratory: clear to auscultation bilaterally, no wheezing, no crackles. Normal respiratory effort. No accessory muscle use.  ?Cardiovascular: Regular rate and rhythm, no murmurs / rubs / gallops. No extremity edema.  ?Neurologic: Grossly intact and nonfocal ?Psychiatric: Normal judgment and insight. Alert and oriented x 3. Normal mood.  ? ? ?Impression and Plan: ? ?GAD (generalized anxiety disorder) ? ?[place] Office Visit from 06/15/2021 in [place] at [place]  ?PHQ-9 Total Score 4  ? ?  ? ?-Mood is stable, continue sertraline 25 mg daily. ? ?Time spent:22 minutes reviewing chart, interviewing and examining patient and formulating plan of care. ? ? ? ? ? ?Lelon Frohlich, MD ?[facility] ? ? ?

## 2021-07-18 ENCOUNTER — Telehealth: Payer: Self-pay | Admitting: Endocrinology

## 2021-07-18 NOTE — Telephone Encounter (Signed)
She does not need to be seen on 5/9 as her thyroid levels and ultrasound were stable about a month ago.  She can be seen in August ?

## 2021-07-19 ENCOUNTER — Ambulatory Visit (INDEPENDENT_AMBULATORY_CARE_PROVIDER_SITE_OTHER): Payer: Medicare Other | Admitting: Endocrinology

## 2021-07-19 ENCOUNTER — Encounter: Payer: Self-pay | Admitting: Endocrinology

## 2021-07-19 ENCOUNTER — Other Ambulatory Visit: Payer: Self-pay | Admitting: Internal Medicine

## 2021-07-19 VITALS — BP 118/70 | HR 60 | Ht 63.0 in | Wt 193.4 lb

## 2021-07-19 DIAGNOSIS — M542 Cervicalgia: Secondary | ICD-10-CM | POA: Diagnosis not present

## 2021-07-19 DIAGNOSIS — R509 Fever, unspecified: Secondary | ICD-10-CM | POA: Diagnosis not present

## 2021-07-19 DIAGNOSIS — K219 Gastro-esophageal reflux disease without esophagitis: Secondary | ICD-10-CM

## 2021-07-19 DIAGNOSIS — E039 Hypothyroidism, unspecified: Secondary | ICD-10-CM | POA: Diagnosis not present

## 2021-07-19 LAB — CBC
HCT: 41.7 % (ref 36.0–46.0)
Hemoglobin: 13.9 g/dL (ref 12.0–15.0)
MCHC: 33.2 g/dL (ref 30.0–36.0)
MCV: 90.3 fl (ref 78.0–100.0)
Platelets: 223 10*3/uL (ref 150.0–400.0)
RBC: 4.62 Mil/uL (ref 3.87–5.11)
RDW: 13.9 % (ref 11.5–15.5)
WBC: 5.1 10*3/uL (ref 4.0–10.5)

## 2021-07-19 LAB — SEDIMENTATION RATE: Sed Rate: 25 mm/hr (ref 0–30)

## 2021-07-19 LAB — T4, FREE: Free T4: 1.02 ng/dL (ref 0.60–1.60)

## 2021-07-19 LAB — TSH: TSH: 3.71 u[IU]/mL (ref 0.35–5.50)

## 2021-07-19 NOTE — Progress Notes (Signed)
? ?Subjective:  ? ? Patient ID: Jaime Fuller, female    DOB: 06/12/1950, 71 y.o.   MRN: [id number] ? ?CHIEF complaint: Pain in the neck area ? ?HPI ?  ? ?Patient is coming in for short-term follow-up of her thyroid because of increasing pain in the front of the neck ?This has been going on for at least a year and initially was intermittent but now more persistent ?The pain is in the lower part of the neck and worse on lying down ?She is also very sensitive to touch on the front of the neck ?Currently she is taking tramadol as needed for the pain which has not been prescribed to her ?She does not have any associated difficulty swallowing except has some difficulty swallowing tablets ? ?She was diagnosed to have a multinodular goiter by her PCP and initial ultrasound showed the largest nodule to be 1.6 cm on the right which was biopsied ?The cytology showed Atypia of undetermined significance (category III); Afirma: unable to evaluate ? ?However most recent follow-up on the ultrasound in 06/2021 showed stable nodules ? ?The previously biopsied posterior right mid thyroid solid hypoechoic TR 4 type nodule measures 1.3 x 0.8 x 0.7 cm, previously 1.4 x 1.2 x 0.5 cm. . ?  ?The left inferior thyroid solid isoechoic TR 3 type nodule is stable ?measuring 1.4 x 0.8 x 0.7 cm, previously 1.3 x 0.8 x 0.8 cm. This ?nodule does not meet criteria for any follow-up or biopsy. ? ?HYPOTHYROIDISM: ? ?Apparently she took levothyroxine supplements in the past presumably for hypothyroidism between 1985-2009 ?Subsequently she was off treatment but then in 2021 since her TSH was about 6 she was given levothyroxine by her PCP ?She does not think she felt any better with taking this ? ?Overall she does feel significantly tired now but not any different in the last month since her lab was done ? ?Lab Results  ?Component Value Date  ? TSH 5.14 06/14/2021  ? TSH 2.29 09/21/2020  ? TSH 3.05 03/31/2020  ? FREET4 0.85 06/14/2021  ? FREET4 1.00  09/21/2020  ? FREET4 0.93 03/31/2020  ? ? ?  ?Past Medical History:  ?Diagnosis Date  ? Anxiety state, unspecified 09/20/2007  ? Arthritis   ? Cataract   ? Chronic kidney disease   ? kidney stones  ? HYPOTHYROIDISM 03/08/2007  ? no meds needed now 04-15-19  ? Pancreatitis   ? secondary to ERCP  ? ? ?Past Surgical History:  ?Procedure Laterality Date  ? abdominal plasty    ? Carpel tunnel surgery left hand    ? Childbirth x 2    ? CHOLECYSTECTOMY    ? GASTRIC BYPASS    ? UPPER GASTROINTESTINAL ENDOSCOPY    ? WISDOM TOOTH EXTRACTION    ? ? ?Social History  ? ?Socioeconomic History  ? Marital status: Married  ?  Spouse name: Not on file  ? Number of children: Not on file  ? Years of education: Not on file  ? Highest education level: Not on file  ?Occupational History  ? Not on file  ?Tobacco Use  ? Smoking status: Former  ? Smokeless tobacco: Never  ?Vaping Use  ? Vaping Use: Never used  ?Substance and Sexual Activity  ? Alcohol use: No  ?  Alcohol/week: 0.0 standard drinks  ? Drug use: No  ? Sexual activity: Not on file  ?Other Topics Concern  ? Not on file  ?Social History Narrative  ? Not on file  ? ?  Social Determinants of Health  ? ?Financial Resource Strain: Not on file  ?Food Insecurity: Not on file  ?Transportation Needs: Not on file  ?Physical Activity: Not on file  ?Stress: Not on file  ?Social Connections: Not on file  ?Intimate Partner Violence: Not on file  ? ? ?Current Outpatient Medications on File Prior to Visit  ?Medication Sig Dispense Refill  ? calcium carbonate (TUMS EX) 750 MG chewable tablet Chew 1-3 tablets by mouth as needed for heartburn.    ? clonazePAM (KLONOPIN) 0.5 MG tablet Take 1 tablet (0.5 mg total) by mouth 2 (two) times daily as needed for anxiety. 30 tablet 1  ? EPINEPHrine 0.3 mg/0.3 mL IJ SOAJ injection Inject 0.3 mLs (0.3 mg total) into the muscle as needed for anaphylaxis. 1 each 1  ? famotidine (PEPCID) 20 MG tablet Take 1 tablet (20 mg total) by mouth 2 (two) times daily. 180  tablet 3  ? furosemide (LASIX) 20 MG tablet Take 20 mg by mouth three times weekly as needed for lower extremity swelling. 45 tablet 1  ? levothyroxine (SYNTHROID) 50 MCG tablet TAKE 1 TABLET DAILY 90 tablet 3  ? sertraline (ZOLOFT) 25 MG tablet Take 1 tablet (25 mg total) by mouth daily. 90 tablet 1  ? sucralfate (CARAFATE) 1 GM/10ML suspension Take 10 mLs (1 g total) by mouth 4 (four) times daily. 420 mL 1  ? valACYclovir (VALTREX) 1000 MG tablet ONE TABLET TWICE A DAY X 5 DAYS AS NEEDED FOR OUTBREAK 50 tablet 1  ? ?No current facility-administered medications on file prior to visit.  ? ? ?Allergies  ?Allergen Reactions  ? Bee Venom Anaphylaxis  ? Vicodin [Hydrocodone-Acetaminophen] Itching  ? ? ?Family History  ?Problem Relation Age of Onset  ? Thyroid disease Mother   ? Lung cancer Father   ? Heart attack Father   ?     Died at 2  ? Colon cancer Neg Hx   ? Esophageal cancer Neg Hx   ? Rectal cancer Neg Hx   ? Stomach cancer Neg Hx   ? ? ?BP 118/70   Pulse 60   Ht _0  (1.6 m)   Wt 193 lb 6.4 oz (87.7 kg)   SpO2 99%   BMI 34.26 kg/m?  ? ?Review of Systems  ?Constitutional:  Positive for fever.  ?Gastrointestinal:  Positive for diarrhea.  ?Musculoskeletal:  Negative for arthralgias.  ?Psychiatric/Behavioral:  The patient is not nervous/anxious.   ? ?She says she has fever couple of times a week ? ?   ?Objective:  ? Physical Exam ? ?BP 118/70   Pulse 60   Ht _1  (1.6 m)   Wt 193 lb 6.4 oz (87.7 kg)   SpO2 99%   BMI 34.26 kg/m?  ?   ?Generalized obesity is present ? ?Thyroid exam: ?She is not acutely tender and only if feels sensitivity when right thyroid lobe is palpated ?Small nodules are palpable, 1 on each side, likely 1-1.5 cm, smooth and firm ?She also has a palpable nodule in the left submandibular area which is slightly tender ?No other mass felt in the neck ?Biceps reflexes appear normal ?No peripheral edema ? ? ? ?Lab Results  ?Component Value Date  ? TSH 5.14 06/14/2021  ? ?   ?Assessment &  Plan:  ? ?NECK pain ? ?Patient has had significant and longstanding neck pain which is somewhat worse and appears to be worse on lying down with sensitivity of the anterior neck ?Cannot reproduce pain  on exam except some tenderness on the right thyroid lobe ?She has not had any improvement with treatment of GERD ? ?Currently etiology is unclear and unlikely to be related to thyroid disease although may be remotely related to chronic thyroiditis ? ?She has small thyroid nodules which are stable on recent ultrasound and no further evaluation needed at this time ? ?Mild hypothyroidism, likely subclinical ?She has significant fatigue which are likely to be related to her mild hypothyroidism ? ?Recommendations: ? ?Recheck thyroid levels ?Check TPO antibody ?If her TSH is high normal or high will consider increasing her dose to 75 mcg empirically ? ?Follow-up with PCP regarding either enlarged left submandibular gland or lymph node ?Likely needs CT scan of neck to further evaluate ?Also needs to discuss with PCP why she is having intermittent diarrhea and fever ? ?CBC, ESR will also be ordered today ? ?Elayne Snare MD ? ?Addendum: All labs are normal, follow-up as needed ? ?

## 2021-07-19 NOTE — Telephone Encounter (Signed)
Called to r/s patient's appt due to her thyroid levels and ultrasound becoming stable about a month ago and she says that she would like to still come in due to having pain in her neck at the area of her thyroid and a nodule that has been bothering her and is concerning. Please advise if you'd like her to still keep her appt for today or to reschedule ?

## 2021-07-20 ENCOUNTER — Ambulatory Visit (INDEPENDENT_AMBULATORY_CARE_PROVIDER_SITE_OTHER): Payer: Medicare Other | Admitting: Gastroenterology

## 2021-07-20 ENCOUNTER — Encounter: Payer: Self-pay | Admitting: Gastroenterology

## 2021-07-20 ENCOUNTER — Telehealth: Payer: Self-pay | Admitting: Gastroenterology

## 2021-07-20 VITALS — BP 130/70 | HR 65 | Ht 63.0 in | Wt 192.6 lb

## 2021-07-20 DIAGNOSIS — R1310 Dysphagia, unspecified: Secondary | ICD-10-CM | POA: Diagnosis not present

## 2021-07-20 DIAGNOSIS — R0989 Other specified symptoms and signs involving the circulatory and respiratory systems: Secondary | ICD-10-CM

## 2021-07-20 DIAGNOSIS — J029 Acute pharyngitis, unspecified: Secondary | ICD-10-CM | POA: Diagnosis not present

## 2021-07-20 DIAGNOSIS — K219 Gastro-esophageal reflux disease without esophagitis: Secondary | ICD-10-CM | POA: Diagnosis not present

## 2021-07-20 DIAGNOSIS — R6884 Jaw pain: Secondary | ICD-10-CM

## 2021-07-20 LAB — THYROID PEROXIDASE ANTIBODY: Thyroperoxidase Ab SerPl-aCnc: 14 IU/mL (ref 0–34)

## 2021-07-20 MED ORDER — AMITRIPTYLINE HCL 25 MG PO TABS: 25.0000 mg | ORAL_TABLET | Freq: Every day | ORAL | 3 refills | Status: DC

## 2021-07-20 MED ORDER — DEXLANSOPRAZOLE 60 MG PO CPDR: 60.0000 mg | DELAYED_RELEASE_CAPSULE | Freq: Every day | ORAL | 3 refills | Status: DC

## 2021-07-20 NOTE — Telephone Encounter (Signed)
Patient called states she would like her medications to go to [facility] on file Dexilant and Pantoprazole. Also requested a call once done so she knows. ?

## 2021-07-20 NOTE — Progress Notes (Signed)
? ?Referring Provider: Isaac Bliss, Estel* ?Primary Care Physician:  Isaac Bliss, Rayford Halsted, MD ? ?Reason for Consultation:  GERD ? ? ?IMPRESSION:  ?Globus and biopsy-proven reflux esophagitis not responding to maximal medical therapy ?   -ongoing symptoms despite PPI twice daily and H2 blocker twice daily ?   -given the associated neck and ear pain evaluation by ENT is indicated ?   -did not tolerate placement of manometry catheter ?   -there may be a component of functional heartburn ?History of colon polyps ?   - 4 tubular adenomas removed 05/23/19 ?   - surveillance recommended 2024  ?Gastric bypass 2004 ?Cholecystectomy for symptomatic cholelithiasis ?History of post ERCP pancreatitis 1996 ? ?Globus now constant symptoms with associated pain in the neck, jaw, and throat. Symptoms have escalated enough that she has been using her husband's pain medications.  ? ?EGD recommended to evaluate for reflux and infectious esophagitis. UGI series recommended. Empiric trial of TCA. She did not tolerate manometry and would not be able to stop PPI therapy for additional testing.  ? ?Reconsult ENT given her neck, jaw, and throat pain. ? ?Low threshold to refer to tertiary care center such as [place] for GI input. ? ?PLAN: ?- Dexilant 60 mg QAM given persistent symptoms despite PPI BID and H2B ?- Trial of amitriptyline 25 mg QHS - increased to 50 mg QHS after 3 weeks if no improvement ?- Barium esophagram ?- EGD  ?- Referral to [facility] to evaluate neck and jaw pain ?- Referral to [facility] if no improvement with this plan ? ?Please see the "Patient Instructions" section for addition details about the plan. ? ?I spent 30 minutes, including in depth chart review, face-to-face time with the patient, coordinating care, and ordering studies and medications as appropriate, and documentation. ? ?HPI: STARKEISHA VANWINKLE is a 71 y.o. female who returns in follow-up with reflux and dysphagia.  She has a history of distant  cholecystectomy for symptomatic cholelithiasis, post-ERCP pancreatitis 1996, gastric bypass 2004, and evaluated by Dr. Olevia Perches in 2006 for anemia.  She also has generalized anxiety disorder, chronic back pain, hypothyroidism, and a history of pernicious anemia. ? ?Symptoms date back to time of colonoscopy 05/2019. Had the Covid vaccine soon thereafter with resulting palpable lymph nodes. Lymph notes became smaller, but she was left with an ongoing sensation of globus in her neck/throat.  This sensation has never resolved and has actually become more constant and bothersome. Associated dysphonia, frequent coughing. Sore when she touches her sternal notch with the sensation that she is choking. Was occasionally experiencing neck pain that radiates to her ears and dysphonia but now feels like her neck is on fire with worsening bilateral ear pain. No she has near constant sore throat, jaw pain, and no neck pain. No heartburn, regurgitation, brash. No anemia, dysphagia, early satiety, hematemesis, hematochezia, melena, odynophagia, or weight loss. Noted temporary improvement after steroid injection. ? ?She is currently using Tramadol and her husband's oxycodone because the pain is so severe.  She feels like her throat is sore.  These symptoms are similar to those that she has had in the past but they are much more severe. ? ?She felt like the thyroid nodule biopsy was identical to the pain that she feels when she swallows.  ? ?Under some psychosocial stressors related to her daughter struggling with alcoholism and another daughter having stress as a Technical sales engineer.  ? ? ?Symptoms not improved despite: ?- PPI BID (pantoprazole, then  omeprazole which causes flatus, then lansoprazole) ?- Famotidine 20 mg BID ?- Removing carbonated beverages from the diet ?- Discontinuation of Celebrex ?- Carafate 1 g QID ?- TUMS PRN ?- Effexor resulted in severe sleepiness ? ?Recent evaluation includes: ?- EGD 12/21 showed biopsy-proven  reflux esophagitis, evidence for prior gastric bypass, and hyperplastic gastric polyp ?- Thyroid ultrasound showed a 1.6cm right nodule. Biopsy was indeterminate. Nodule thought to be too small to explain her symptoms.  ?- ENT evaluation by Dr. Lucia Gaskins 06/10/20  ?- Thryoid ultrasound demonstrated a couple of nodules that were not suspicious however the patient did have a 1.6 cm right mid posterior nodule that warranted fine-needle aspirate which has been performed and demonstrated atypia of undetermined significance Bethesda category III.Marland Kitchen Afirma testing was unable to be performed because of low follicular content. He thought her sore throat was likely reflux and her burning sensation in her ears a neuropathic pain. He also wondered if her symptoms could be related to stress.  ?- Did not tolerate attempt at esophageal manometry ? ? ?Prior endoscopic history: ?- Screening colonoscopy with Dr. Olevia Perches 05/05/2004: normal ?- EGD with Dr. Olevia Perches 10/18/2004 to evaluate anemia, and chest pain:GE junction at 35 cm, gastric bypass anastomosis present, otherwise normal.  Biopsies of the small bowel were normal. ?- Colonoscopy 05/23/19: left sided diverticulosis, 4 tubular adenomas ?- EGD 12/21 showed biopsy-proven reflux esophagitis, evidence for prior gastric bypass, and hyperplastic gastric polyp ? ?EKG 09/21/20 showed normal sinus rhythm with normal QTc.  ? ? ?Past Medical History:  ?Diagnosis Date  ? Anxiety state, unspecified 09/20/2007  ? Arthritis   ? Cataract   ? Chronic kidney disease   ? kidney stones  ? HYPOTHYROIDISM 03/08/2007  ? no meds needed now 04-15-19  ? Pancreatitis   ? secondary to ERCP  ? ? ?Past Surgical History:  ?Procedure Laterality Date  ? abdominal plasty    ? Carpel tunnel surgery left hand    ? Childbirth x 2    ? CHOLECYSTECTOMY    ? GASTRIC BYPASS    ? UPPER GASTROINTESTINAL ENDOSCOPY    ? WISDOM TOOTH EXTRACTION    ? ? ?Current Outpatient Medications  ?Medication Sig Dispense Refill  ? calcium carbonate  (TUMS EX) 750 MG chewable tablet Chew 1-3 tablets by mouth as needed for heartburn.    ? EPINEPHrine 0.3 mg/0.3 mL IJ SOAJ injection Inject 0.3 mLs (0.3 mg total) into the muscle as needed for anaphylaxis. 1 each 1  ? famotidine (PEPCID) 20 MG tablet Take 1 tablet (20 mg total) by mouth 2 (two) times daily. 180 tablet 3  ? furosemide (LASIX) 20 MG tablet Take 20 mg by mouth three times weekly as needed for lower extremity swelling. 45 tablet 1  ? HYDROcodone-acetaminophen (NORCO/VICODIN) 5-325 MG tablet Take 1 tablet by mouth as needed for moderate pain.    ? levothyroxine (SYNTHROID) 50 MCG tablet TAKE 1 TABLET DAILY 90 tablet 3  ? pantoprazole (PROTONIX) 40 MG tablet TAKE 1 TABLET(40 MG) BY MOUTH TWICE DAILY 180 tablet 1  ? sertraline (ZOLOFT) 25 MG tablet Take 1 tablet (25 mg total) by mouth daily. 90 tablet 1  ? traMADol (ULTRAM) 50 MG tablet Take 50 mg by mouth as needed.    ? valACYclovir (VALTREX) 1000 MG tablet ONE TABLET TWICE A DAY X 5 DAYS AS NEEDED FOR OUTBREAK 50 tablet 1  ? ?No current facility-administered medications for this visit.  ? ? ?Allergies as of 07/20/2021 - Review Complete 07/20/2021  ?Allergen  Reaction Noted  ? Bee venom Anaphylaxis 04/15/2019  ? Vicodin [hydrocodone-acetaminophen] Itching 07/07/2010  ? ? ?Family History  ?Problem Relation Age of Onset  ? Thyroid disease Mother   ? Lung cancer Father   ? Heart attack Father   ?     Died at 12  ? Colon cancer Neg Hx   ? Esophageal cancer Neg Hx   ? Rectal cancer Neg Hx   ? Stomach cancer Neg Hx   ? ? ? ? ?Physical Exam: ?General:   Alert,  well-nourished, pleasant and cooperative in NAD. Tearful during her exam today.  ?Head:  Normocephalic and atraumatic. ?Eyes:  Sclera clear, no icterus.   Conjunctiva pink. ?Mouth: Posterior erythema. No exudates. ?Neck: No thyromegaly or LAD ?Abdomen:  Soft , nontender, nondistended, normal bowel sounds, no rebound or guarding. No hepatosplenomegaly.   ?Neurologic:  Alert and  oriented x4;  grossly  nonfocal ?Skin:  Intact without significant lesions or rashes. ?Psych:  Alert and cooperative. Normal mood and affect. ? ?I spent over 30 minutes, including in depth chart review, independent review of re

## 2021-07-20 NOTE — Patient Instructions (Addendum)
It was my pleasure to provide care to you today. Based on our discussion, I am providing you with my recommendations below: ? ?RECOMMENDATION(S):  ? ?We will try to switch your pantoprazole to Dexilant. ? ?We discussed a trial of amitriptyline 25 mg every night. If you are doing okay with this medication and not having a dry mouth, you could increase the dose to 50 mg every night after 3 weeks.  ? ?I have recommended an esophagram and an upper endoscopy for further evaluation. ? ?We also discussed referrals to [facility] to see both ENT and the esophagus clinic there.  ? ?I recommend trying diaphragmatic breathing as this can often help with your symptoms. Sometimes this is called abdominal breathing or belly breathing.  ? - Sit upright in a chair. Your spine should be straight, knees bent, shoulders loose, and head and neck relaxed. ?- You mouth can be slightly open with teeth separated.  ?- Place on hand on your chest and one hand on your abdomen, just below the rib cage. ?- Breathe in for 4 second through your nose and fee your stomach pushing out against your hand. Try to keep the hand on your chest from moving.  ?- Hold your stomach muscles tight and then start to let your belly deflate as you exhale for 6 seconds. It helps to purse your lips on the exhale like you are blowing through a straw.  ? ?PRESCRIPTION MEDICATION(S):  ? ?We have sent the following medication(s) to your pharmacy: ? ?Dexilant 60 mg QAM given persistent symptoms despite PPI BID and H2B ?Trial of amitriptyline 25 mg QHS - increased to 50 mg QHS after 3 weeks if no improvement. ? ?NOTE: If your medication(s) requires a PRIOR AUTHORIZATION, we will receive notification from your pharmacy. Once received, the process to submit for approval may take up to 7-10 business days. You will be contacted about any denials we have received from your insurance company as well as alternatives recommended by your provider. ? ?BARIUM ESOPHAGRAM: ? ?You have  been scheduled for a Barium Esophogram at [facility]iology (1st floor of the hospital) on Tuesday 08/02/21 at 10:30 am. Please arrive @ 10:15 am for registration. Please DO NOT eat or drink anything 3 hours prior to your test. This test typically takes about 30 minutes to perform. ? ?NEED TO RESCHEDULE?  ? ?Please call radiology at [phone]. ? ?WHY ARE YOU HAVING THIS EXAM? ? ?A barium swallow is an examination that concentrates on views of the esophagus. This tends to be a double contrast exam (barium and two liquids which, when combined, create a gas to distend the wall of the oesophagus) or single contrast (non-ionic iodine based). The study is usually tailored to your symptoms so a good history is essential. Attention is paid during the study to the form, structure and configuration of the esophagus, looking for functional disorders (such as aspiration, dysphagia, achalasia, motility and reflux) ? ?EXAMINATION ?You may be asked to change into a gown, depending on the type of swallow being performed. A radiologist and radiographer will perform the procedure. The radiologist will advise you of the type of contrast selected for your procedure and direct you during the exam. You will be asked to stand, sit or lie in several different positions and to hold a small amount of fluid in your mouth before being asked to swallow while the imaging is performed .In some instances you may be asked to swallow barium coated marshmallows to assess the  motility of a solid food bolus. The exam can be recorded as a digital or video fluoroscopy procedure. ? ?POST PROCEDURE ?It will take 1-2 days for the barium to pass through your system. To facilitate this, it is important, unless otherwise directed, to increase your fluids for the next 24-48hrs and to resume your normal diet.  ? ?REFERRAL: ? ?A referral, your demographics, a copy of your insurance card and your records will be sent to [facility]. You will receive a call from their office regarding the date, time and location of your appointment. ? ?ENDOSCOPY:  ? ?You have been scheduled for an endoscopy. Please follow written instructions given to you at your visit today. ? ?INHALERS:  ? ?If you use inhalers (even only as needed), please bring them with you on the day of your procedure. ? ?FOLLOW UP: ? ?After your procedure, you will receive a call from my office staff regarding my recommendation for follow up. ? ?BMI: ? ?If you are age 70 or older, your body mass index should be between 23-30. Your Body mass index is 34.12 kg/m?Marland Kitchen If this is out of the aforementioned range listed, please consider follow up with your Primary Care Provider. ? ? ?MY CHART: ? ?The [facility] providers would like to encourage you to use [facility] to communicate with providers for non-urgent requests or questions.  Due to long hold times on the telephone, sending your provider a message by [facility] may be a faster and more efficient way to get a response.  Please allow 48 business hours for a response.  Please remember that this is for non-urgent requests.  ? ?Thank you for trusting me with your gastrointestinal care!   ? ?Thornton Park, MD, MPH ? ?

## 2021-07-21 NOTE — Telephone Encounter (Signed)
Spoke with patient and informed her medications were sent yesterday. Patient verified that [place] needs a prior authorization. Will submit via [employer]s. ?

## 2021-07-21 NOTE — Telephone Encounter (Signed)
Patient called wanting to give her member ID for her prior authorization: ? ?[id number] ?

## 2021-07-26 ENCOUNTER — Ambulatory Visit (AMBULATORY_SURGERY_CENTER): Payer: Medicare Other | Admitting: Gastroenterology

## 2021-07-26 ENCOUNTER — Encounter: Payer: Self-pay | Admitting: Gastroenterology

## 2021-07-26 VITALS — BP 103/37 | HR 59 | Temp 97.3°F | Resp 12 | Ht 63.0 in | Wt 192.0 lb

## 2021-07-26 DIAGNOSIS — K2970 Gastritis, unspecified, without bleeding: Secondary | ICD-10-CM | POA: Diagnosis not present

## 2021-07-26 DIAGNOSIS — K2950 Unspecified chronic gastritis without bleeding: Secondary | ICD-10-CM | POA: Diagnosis not present

## 2021-07-26 DIAGNOSIS — K229 Disease of esophagus, unspecified: Secondary | ICD-10-CM | POA: Diagnosis not present

## 2021-07-26 DIAGNOSIS — K319 Disease of stomach and duodenum, unspecified: Secondary | ICD-10-CM | POA: Diagnosis not present

## 2021-07-26 DIAGNOSIS — K219 Gastro-esophageal reflux disease without esophagitis: Secondary | ICD-10-CM

## 2021-07-26 MED ORDER — SODIUM CHLORIDE 0.9 % IV SOLN
500.0000 mL | Freq: Once | INTRAVENOUS | Status: DC
Start: 2021-07-26 — End: 2021-07-26

## 2021-07-26 NOTE — Op Note (Signed)
[facility] ?Patient Name: Jaime Fuller ?Procedure Date: 07/26/2021 3:36 PM ?MRN: [id number] ?Endoscopist: Thornton Park MD, MD ?Age: 71 ?Referring MD:  ?Date of Birth: 12-Oct-1950 ?Gender: Female ?Account #: [id number] ?Procedure:                Upper GI endoscopy ?Indications:              Esophageal reflux symptoms that persist despite  ?                          appropriate therapy, Globus sensation ?Medicines:                Monitored Anesthesia Care ?Procedure:                Pre-Anesthesia Assessment: ?                          - Prior to the procedure, a History and Physical  ?                          was performed, and patient medications and  ?                          allergies were reviewed. The patient's tolerance of  ?                          previous anesthesia was also reviewed. The risks  ?                          and benefits of the procedure and the sedation  ?                          options and risks were discussed with the patient.  ?                          All questions were answered, and informed consent  ?                          was obtained. Prior Anticoagulants: The patient has  ?                          taken no previous anticoagulant or antiplatelet  ?                          agents. ASA Grade Assessment: II - A patient with  ?                          mild systemic disease. After reviewing the risks  ?                          and benefits, the patient was deemed in  ?                          satisfactory condition to undergo the procedure. ?  After obtaining informed consent, the endoscope was  ?                          passed under direct vision. Throughout the  ?                          procedure, the patient's blood pressure, pulse, and  ?                          oxygen saturations were monitored continuously. The  ?                          Endoscope was introduced through the mouth, and  ?                          advanced to the  jejunum. The upper GI endoscopy was  ?                          accomplished without difficulty. The patient  ?                          tolerated the procedure well. ?Scope In: ?Scope Out: ?Findings:                 The examined esophagus was normal. The z-line is  ?                          located 35 cm from the incisors. Biopsies were  ?                          taken from the distal esophagus with a cold forceps  ?                          for histology. Estimated blood loss was minimal. ?                          Evidence of a gastric bypass was found. A gastric  ?                          pouch with a normal size was found. Gastric mucosa  ?                          appears normal. The gastrojejunal anastomosis was  ?                          characterized by healthy appearing mucosa. This was  ?                          traversed. The pouch-to-jejunum limb was  ?                          characterized by healthy appearing mucosa. The  ?  duodenum-to-jejunum limb was not examined as it  ?                          could not be found. ?                          The examined jejunum was normal. ?Complications:            No immediate complications. ?Estimated Blood Loss:     Estimated blood loss was minimal. ?Impression:               - Normal esophagus. Biopsied. ?                          - Gastric bypass with a normal-sized pouch.  ?                          Gastrojejunal anastomosis characterized by healthy  ?                          appearing mucosa. ?                          - Normal examined jejunum. ?Recommendation:           - Patient has a contact number available for  ?                          emergencies. The signs and symptoms of potential  ?                          delayed complications were discussed with the  ?                          patient. Return to normal activities tomorrow.  ?                          Written discharge instructions were provided to the  ?                           patient. ?                          - Resume previous diet. ?                          - Continue present medications. ?                          - Await pathology results. ?Thornton Park MD, MD ?07/26/2021 3:53:55 PM ?This report has been signed electronically. ?

## 2021-07-26 NOTE — Progress Notes (Signed)
To pacu, VSS. Report to [email or web address] 

## 2021-07-26 NOTE — Patient Instructions (Signed)
Resume previous diet and medications. Awaiting pathology results. ? ?YOU HAD AN ENDOSCOPIC PROCEDURE TODAY AT [facility]:   Refer to the procedure report that was given to you for any specific questions about what was found during the examination.  If the procedure report does not answer your questions, please call your gastroenterologist to clarify.  If you requested that your care partner not be given the details of your procedure findings, then the procedure report has been included in a sealed envelope for you to review at your convenience later. ? ?YOU SHOULD EXPECT: Some feelings of bloating in the abdomen. Passage of more gas than usual.  Walking can help get rid of the air that was put into your GI tract during the procedure and reduce the bloating. If you had a lower endoscopy (such as a colonoscopy or flexible sigmoidoscopy) you may notice spotting of blood in your stool or on the toilet paper. If you underwent a bowel prep for your procedure, you may not have a normal bowel movement for a few days. ? ?Please Note:  You might notice some irritation and congestion in your nose or some drainage.  This is from the oxygen used during your procedure.  There is no need for concern and it should clear up in a day or so. ? ?SYMPTOMS TO REPORT IMMEDIATELY: ? ? ?Following upper endoscopy (EGD) ? Vomiting of blood or coffee ground material ? New chest pain or pain under the shoulder blades ? Painful or persistently difficult swallowing ? New shortness of breath ? Fever of 100?F or higher ? Black, tarry-looking stools ? ?For urgent or emergent issues, a gastroenterologist can be reached at any hour by calling [phone]. ?Do not use MyChart messaging for urgent concerns.  ? ? ?DIET:  We do recommend a small meal at first, but then you may proceed to your regular diet.  Drink plenty of fluids but you should avoid alcoholic beverages for 24 hours. ? ?ACTIVITY:  You should plan to take it easy for  the rest of today and you should NOT DRIVE or use heavy machinery until tomorrow (because of the sedation medicines used during the test).   ? ?FOLLOW UP: ?Our staff will call the number listed on your records 48-72 hours following your procedure to check on you and address any questions or concerns that you may have regarding the information given to you following your procedure. If we do not reach you, we will leave a message.  We will attempt to reach you two times.  During this call, we will ask if you have developed any symptoms of COVID 19. If you develop any symptoms (ie: fever, flu-like symptoms, shortness of breath, cough etc.) before then, please call [phone].  If you test positive for Covid 19 in the 2 weeks post procedure, please call and report this information to [place].   ? ?If any biopsies were taken you will be contacted by phone or by letter within the next 1-3 weeks.  Please call us at [phone] if you have not heard about the biopsies in 3 weeks.  ? ? ?SIGNATURES/CONFIDENTIALITY: ?You and/or your care partner have signed paperwork which will be entered into your electronic medical record.  These signatures attest to the fact that that the information above on your After Visit Summary has been reviewed and is understood.  Full responsibility of the confidentiality of this discharge information lies with you and/or your care-partner.  ?

## 2021-07-26 NOTE — Progress Notes (Signed)
Called to room to assist during endoscopic procedure.  Patient ID and intended procedure confirmed with present staff. Received instructions for my participation in the procedure from the performing physician.  

## 2021-07-26 NOTE — Telephone Encounter (Signed)
PRIOR AUTHORIZATION ? ?PA initiation date: 07/25/21  ?Medication: Dexilant 60 mg ?Insurance Company: [employer] ?Submission completed electronically through Cover My Meds: Yes ? ?Will await insurance response re: approval/denial. ? ?

## 2021-07-26 NOTE — Progress Notes (Signed)
Indication for upper endoscopy: Globus and reflux not responding to maximal medical therapy ? ?Please see my 07/20/2021 office visit for complete details.  There has been no significant change in history or physical exam since that time.  She remains on adequate candidate for monitored anesthesia care in the [facility]. ?

## 2021-07-27 NOTE — Telephone Encounter (Signed)
APPROVAL ? ?Medication: Dexilant ?Insurance Company: [employer] ?PA response: Drug is covered by current benefit plan. No further PA activity needed ? ?Spoke with pharmacy and patient picked up 90 day supply on 07/22/21. ? ? ? ?

## 2021-07-28 ENCOUNTER — Telehealth: Payer: Self-pay

## 2021-07-28 NOTE — Telephone Encounter (Signed)
  Follow up Call-     07/26/2021    2:44 PM 02/25/2020   12:39 PM 05/23/2019    9:44 AM  Call back number  Post procedure Call Back phone  # [phone]  Permission to leave phone message Yes Yes Yes     Patient questions:  Do you have a fever, pain , or abdominal swelling? No. Pain Score  0 *  Have you tolerated food without any problems? Yes.    Have you been able to return to your normal activities? Yes.    Do you have any questions about your discharge instructions: Diet   No. Medications  No. Follow up visit  No.  Do you have questions or concerns about your Care? No.  Actions: * If pain score is 4 or above: No action needed, pain <4.

## 2021-08-02 ENCOUNTER — Other Ambulatory Visit: Payer: Self-pay | Admitting: Gastroenterology

## 2021-08-02 ENCOUNTER — Ambulatory Visit (HOSPITAL_COMMUNITY)
Admission: RE | Admit: 2021-08-02 | Discharge: 2021-08-02 | Disposition: A | Discharge status: Home / Self Care | Payer: Medicare Other | Source: Ambulatory Visit | Attending: Gastroenterology | Admitting: Gastroenterology

## 2021-08-02 ENCOUNTER — Encounter: Payer: Self-pay | Admitting: Gastroenterology

## 2021-08-02 DIAGNOSIS — K224 Dyskinesia of esophagus: Secondary | ICD-10-CM | POA: Diagnosis not present

## 2021-08-02 DIAGNOSIS — R1310 Dysphagia, unspecified: Secondary | ICD-10-CM

## 2021-08-02 DIAGNOSIS — K219 Gastro-esophageal reflux disease without esophagitis: Secondary | ICD-10-CM | POA: Diagnosis not present

## 2021-08-02 DIAGNOSIS — K449 Diaphragmatic hernia without obstruction or gangrene: Secondary | ICD-10-CM | POA: Diagnosis not present

## 2021-09-12 ENCOUNTER — Other Ambulatory Visit: Payer: Self-pay | Admitting: Internal Medicine

## 2021-09-12 DIAGNOSIS — F411 Generalized anxiety disorder: Secondary | ICD-10-CM

## 2021-09-20 ENCOUNTER — Ambulatory Visit: Payer: Medicare Other | Admitting: Endocrinology

## 2021-10-27 ENCOUNTER — Telehealth: Payer: Self-pay | Admitting: Internal Medicine

## 2021-10-27 ENCOUNTER — Telehealth: Payer: Self-pay | Admitting: Gastroenterology

## 2021-10-27 MED ORDER — LEVOTHYROXINE SODIUM 50 MCG PO TABS
50.0000 ug | ORAL_TABLET | Freq: Every day | ORAL | 1 refills | Status: DC
Start: 2021-10-27 — End: 2022-04-06

## 2021-10-27 NOTE — Telephone Encounter (Signed)
Inbound call from patient staeing she needs additional refills for Elevil 25 MG. Prescription can be sent to [facility] on [address] in [place]. If needing to further advise please contact patient. Patient last OV WAS 07/26/21.

## 2021-10-27 NOTE — Telephone Encounter (Signed)
Refill sent.

## 2021-10-27 NOTE — Telephone Encounter (Signed)
Pt is requesting a 90 day supply of the  levothyroxine (SYNTHROID) 50 MCG tablet  Last OV:  06/15/2021  Please advise.  [place], [place] Phone:  [phone]  Fax:  [phone]

## 2021-10-28 MED ORDER — AMITRIPTYLINE HCL 25 MG PO TABS: 25.0000 mg | ORAL_TABLET | Freq: Every day | ORAL | 3 refills | Status: DC

## 2021-10-28 NOTE — Telephone Encounter (Signed)
Script sent to pharmacy.

## 2021-11-14 ENCOUNTER — Other Ambulatory Visit: Payer: Self-pay | Admitting: Gastroenterology

## 2021-11-18 ENCOUNTER — Encounter: Payer: Self-pay | Admitting: Internal Medicine

## 2022-01-04 ENCOUNTER — Other Ambulatory Visit: Payer: Self-pay | Admitting: Gastroenterology

## 2022-01-04 DIAGNOSIS — R09A2 Foreign body sensation, throat: Secondary | ICD-10-CM

## 2022-01-04 DIAGNOSIS — K219 Gastro-esophageal reflux disease without esophagitis: Secondary | ICD-10-CM

## 2022-03-04 ENCOUNTER — Other Ambulatory Visit: Payer: Self-pay | Admitting: Gastroenterology

## 2022-04-04 ENCOUNTER — Other Ambulatory Visit: Payer: Self-pay | Admitting: Internal Medicine

## 2022-05-02 ENCOUNTER — Encounter: Payer: Self-pay | Admitting: Gastroenterology

## 2022-05-18 DIAGNOSIS — R6883 Chills (without fever): Secondary | ICD-10-CM | POA: Diagnosis not present

## 2022-05-18 DIAGNOSIS — J0100 Acute maxillary sinusitis, unspecified: Secondary | ICD-10-CM | POA: Diagnosis not present

## 2022-07-11 ENCOUNTER — Telehealth: Payer: Self-pay | Admitting: Gastroenterology

## 2022-07-11 ENCOUNTER — Other Ambulatory Visit: Payer: Self-pay

## 2022-07-11 MED ORDER — AMITRIPTYLINE HCL 25 MG PO TABS: ORAL_TABLET | ORAL | 3 refills | Status: DC

## 2022-07-11 NOTE — Telephone Encounter (Signed)
That's okay to refill for 90 days with a refill

## 2022-07-11 NOTE — Telephone Encounter (Signed)
Patient called stating that she recently picked up her amitriptyline medication. States this time it was a 30 day supple instead of a 90 day supply that she was able to pick up before. She is requesting for another 90 day supply be sent through [facility]. Please advise, thank you.

## 2022-07-12 ENCOUNTER — Other Ambulatory Visit: Payer: Self-pay | Admitting: Internal Medicine

## 2022-07-12 ENCOUNTER — Other Ambulatory Visit: Payer: Self-pay

## 2022-07-12 DIAGNOSIS — F411 Generalized anxiety disorder: Secondary | ICD-10-CM

## 2022-07-12 MED ORDER — AMITRIPTYLINE HCL 25 MG PO TABS: ORAL_TABLET | ORAL | 3 refills | Status: DC

## 2022-07-12 NOTE — Telephone Encounter (Signed)
Patient called regarding medication refill. States that is was sent to [facility] and not [facility] that was requesting. She is requesting for it to be sent over to [facility]. Please advise, thank you.

## 2022-09-11 DIAGNOSIS — M79641 Pain in right hand: Secondary | ICD-10-CM | POA: Diagnosis not present

## 2022-09-11 DIAGNOSIS — S61216A Laceration without foreign body of right little finger without damage to nail, initial encounter: Secondary | ICD-10-CM | POA: Diagnosis not present

## 2022-09-11 DIAGNOSIS — S61214A Laceration without foreign body of right ring finger without damage to nail, initial encounter: Secondary | ICD-10-CM | POA: Diagnosis not present

## 2022-09-13 DIAGNOSIS — S61214D Laceration without foreign body of right ring finger without damage to nail, subsequent encounter: Secondary | ICD-10-CM | POA: Diagnosis not present

## 2022-09-13 DIAGNOSIS — S61216D Laceration without foreign body of right little finger without damage to nail, subsequent encounter: Secondary | ICD-10-CM | POA: Diagnosis not present

## 2022-10-05 ENCOUNTER — Other Ambulatory Visit: Payer: Self-pay | Admitting: *Deleted

## 2022-10-05 DIAGNOSIS — F411 Generalized anxiety disorder: Secondary | ICD-10-CM

## 2022-10-05 NOTE — Telephone Encounter (Signed)
Left message on machine for patient.  Patient will need to schedule an appointment for further refills of  sertraline (ZOLOFT) 25 MG tablet [facility]

## 2022-10-10 NOTE — Telephone Encounter (Signed)
Refill was denied.

## 2022-10-26 ENCOUNTER — Other Ambulatory Visit: Payer: Self-pay | Admitting: *Deleted

## 2022-10-26 DIAGNOSIS — F411 Generalized anxiety disorder: Secondary | ICD-10-CM

## 2023-03-16 DIAGNOSIS — Z6834 Body mass index (BMI) 34.0-34.9, adult: Secondary | ICD-10-CM | POA: Diagnosis not present

## 2023-03-16 DIAGNOSIS — M4726 Other spondylosis with radiculopathy, lumbar region: Secondary | ICD-10-CM | POA: Diagnosis not present

## 2023-04-24 ENCOUNTER — Other Ambulatory Visit: Payer: Self-pay

## 2023-04-24 ENCOUNTER — Encounter (HOSPITAL_COMMUNITY): Payer: Self-pay

## 2023-04-24 ENCOUNTER — Inpatient Hospital Stay (HOSPITAL_COMMUNITY)
Admission: EM | Admit: 2023-04-24 | Discharge: 2023-04-30 | DRG: 286 | Disposition: A | Discharge status: Home / Self Care | Payer: Medicare Other | Attending: Cardiology | Admitting: Cardiology

## 2023-04-24 ENCOUNTER — Emergency Department (HOSPITAL_COMMUNITY): Payer: Medicare Other

## 2023-04-24 DIAGNOSIS — Z87891 Personal history of nicotine dependence: Secondary | ICD-10-CM | POA: Diagnosis not present

## 2023-04-24 DIAGNOSIS — Z6837 Body mass index (BMI) 37.0-37.9, adult: Secondary | ICD-10-CM

## 2023-04-24 DIAGNOSIS — Z8249 Family history of ischemic heart disease and other diseases of the circulatory system: Secondary | ICD-10-CM | POA: Diagnosis not present

## 2023-04-24 DIAGNOSIS — I214 Non-ST elevation (NSTEMI) myocardial infarction: Secondary | ICD-10-CM | POA: Diagnosis not present

## 2023-04-24 DIAGNOSIS — N179 Acute kidney failure, unspecified: Secondary | ICD-10-CM | POA: Diagnosis not present

## 2023-04-24 DIAGNOSIS — R051 Acute cough: Secondary | ICD-10-CM | POA: Diagnosis not present

## 2023-04-24 DIAGNOSIS — Z7982 Long term (current) use of aspirin: Secondary | ICD-10-CM

## 2023-04-24 DIAGNOSIS — K219 Gastro-esophageal reflux disease without esophagitis: Secondary | ICD-10-CM | POA: Diagnosis not present

## 2023-04-24 DIAGNOSIS — R070 Pain in throat: Secondary | ICD-10-CM | POA: Diagnosis not present

## 2023-04-24 DIAGNOSIS — Z79890 Hormone replacement therapy (postmenopausal): Secondary | ICD-10-CM

## 2023-04-24 DIAGNOSIS — Z8349 Family history of other endocrine, nutritional and metabolic diseases: Secondary | ICD-10-CM

## 2023-04-24 DIAGNOSIS — R0789 Other chest pain: Secondary | ICD-10-CM | POA: Diagnosis not present

## 2023-04-24 DIAGNOSIS — R509 Fever, unspecified: Secondary | ICD-10-CM | POA: Diagnosis not present

## 2023-04-24 DIAGNOSIS — Z79899 Other long term (current) drug therapy: Secondary | ICD-10-CM

## 2023-04-24 DIAGNOSIS — Z801 Family history of malignant neoplasm of trachea, bronchus and lung: Secondary | ICD-10-CM | POA: Diagnosis not present

## 2023-04-24 DIAGNOSIS — I493 Ventricular premature depolarization: Secondary | ICD-10-CM | POA: Diagnosis not present

## 2023-04-24 DIAGNOSIS — R079 Chest pain, unspecified: Secondary | ICD-10-CM | POA: Diagnosis not present

## 2023-04-24 DIAGNOSIS — Z9884 Bariatric surgery status: Secondary | ICD-10-CM | POA: Diagnosis not present

## 2023-04-24 DIAGNOSIS — F419 Anxiety disorder, unspecified: Secondary | ICD-10-CM | POA: Diagnosis present

## 2023-04-24 DIAGNOSIS — I129 Hypertensive chronic kidney disease with stage 1 through stage 4 chronic kidney disease, or unspecified chronic kidney disease: Secondary | ICD-10-CM | POA: Diagnosis not present

## 2023-04-24 DIAGNOSIS — E785 Hyperlipidemia, unspecified: Secondary | ICD-10-CM | POA: Diagnosis present

## 2023-04-24 DIAGNOSIS — E039 Hypothyroidism, unspecified: Secondary | ICD-10-CM | POA: Diagnosis present

## 2023-04-24 DIAGNOSIS — Z885 Allergy status to narcotic agent status: Secondary | ICD-10-CM | POA: Diagnosis not present

## 2023-04-24 DIAGNOSIS — R7989 Other specified abnormal findings of blood chemistry: Secondary | ICD-10-CM

## 2023-04-24 DIAGNOSIS — R778 Other specified abnormalities of plasma proteins: Secondary | ICD-10-CM | POA: Diagnosis not present

## 2023-04-24 DIAGNOSIS — Z91030 Bee allergy status: Secondary | ICD-10-CM | POA: Diagnosis not present

## 2023-04-24 DIAGNOSIS — R0981 Nasal congestion: Secondary | ICD-10-CM | POA: Diagnosis not present

## 2023-04-24 DIAGNOSIS — U071 COVID-19: Secondary | ICD-10-CM | POA: Diagnosis present

## 2023-04-24 DIAGNOSIS — N1832 Chronic kidney disease, stage 3b: Secondary | ICD-10-CM | POA: Diagnosis present

## 2023-04-24 DIAGNOSIS — I472 Ventricular tachycardia, unspecified: Secondary | ICD-10-CM | POA: Diagnosis present

## 2023-04-24 DIAGNOSIS — I429 Cardiomyopathy, unspecified: Secondary | ICD-10-CM | POA: Diagnosis present

## 2023-04-24 DIAGNOSIS — K6389 Other specified diseases of intestine: Secondary | ICD-10-CM | POA: Diagnosis not present

## 2023-04-24 DIAGNOSIS — J40 Bronchitis, not specified as acute or chronic: Secondary | ICD-10-CM | POA: Diagnosis not present

## 2023-04-24 LAB — BASIC METABOLIC PANEL
Anion gap: 11 (ref 5–15)
BUN: 21 mg/dL (ref 8–23)
CO2: 22 mmol/L (ref 22–32)
Calcium: 8.8 mg/dL — ABNORMAL LOW (ref 8.9–10.3)
Chloride: 106 mmol/L (ref 98–111)
Creatinine, Ser: 1.31 mg/dL — ABNORMAL HIGH (ref 0.44–1.00)
GFR, Estimated: 44 mL/min — ABNORMAL LOW (ref >60)
Glucose, Bld: 105 mg/dL — ABNORMAL HIGH (ref 70–99)
Potassium: 4.5 mmol/L (ref 3.5–5.1)
Sodium: 139 mmol/L (ref 135–145)

## 2023-04-24 LAB — CBC
HCT: 41.9 % (ref 36.0–46.0)
Hemoglobin: 13.6 g/dL (ref 12.0–15.0)
MCH: 28.7 pg (ref 26.0–34.0)
MCHC: 32.5 g/dL (ref 30.0–36.0)
MCV: 88.4 fL (ref 80.0–100.0)
Platelets: 244 10*3/uL (ref 150–400)
RBC: 4.74 MIL/uL (ref 3.87–5.11)
RDW: 13.5 % (ref 11.5–15.5)
WBC: 6.7 10*3/uL (ref 4.0–10.5)
nRBC: 0 % (ref 0.0–0.2)

## 2023-04-24 LAB — HEPATIC FUNCTION PANEL
ALT: 29 U/L (ref 0–44)
AST: 28 U/L (ref 15–41)
Albumin: 3.8 g/dL (ref 3.5–5.0)
Alkaline Phosphatase: 61 U/L (ref 38–126)
Bilirubin, Direct: 0.2 mg/dL (ref 0.0–0.2)
Indirect Bilirubin: 0.4 mg/dL (ref 0.3–0.9)
Total Bilirubin: 0.6 mg/dL (ref 0.0–1.2)
Total Protein: 6.7 g/dL (ref 6.5–8.1)

## 2023-04-24 LAB — TSH: TSH: 5.204 u[IU]/mL — ABNORMAL HIGH (ref 0.350–4.500)

## 2023-04-24 LAB — TROPONIN I (HIGH SENSITIVITY)
Troponin I (High Sensitivity): 30 ng/L — ABNORMAL HIGH (ref <18)
Troponin I (High Sensitivity): 127 ng/L (ref <18)

## 2023-04-24 LAB — MAGNESIUM: Magnesium: 2.4 mg/dL (ref 1.7–2.4)

## 2023-04-24 LAB — BRAIN NATRIURETIC PEPTIDE: B Natriuretic Peptide: 248.1 pg/mL — ABNORMAL HIGH (ref 0.0–100.0)

## 2023-04-24 MED ORDER — HYDROCODONE-ACETAMINOPHEN 5-325 MG PO TABS
1.0000 | ORAL_TABLET | ORAL | Status: DC | PRN
  Administered 2023-04-27 – 2023-04-30 (×4): 1 via ORAL
  Filled 2023-04-24 (×4): qty 1

## 2023-04-24 MED ORDER — NITROGLYCERIN 0.4 MG SL SUBL
0.4000 mg | SUBLINGUAL_TABLET | SUBLINGUAL | Status: DC | PRN
  Administered 2023-04-24 (×2): 0.4 mg via SUBLINGUAL
  Filled 2023-04-24: qty 1

## 2023-04-24 MED ORDER — ASPIRIN 81 MG PO TBEC
81.0000 mg | DELAYED_RELEASE_TABLET | Freq: Every day | ORAL | Status: DC
  Administered 2023-04-25: 81 mg via ORAL
  Filled 2023-04-24: qty 1

## 2023-04-24 MED ORDER — LEVOTHYROXINE SODIUM 50 MCG PO TABS
50.0000 ug | ORAL_TABLET | Freq: Every day | ORAL | Status: DC
  Administered 2023-04-25 – 2023-04-30 (×6): 50 ug via ORAL
  Filled 2023-04-24: qty 2
  Filled 2023-04-24 (×5): qty 1

## 2023-04-24 MED ORDER — ATORVASTATIN CALCIUM 80 MG PO TABS
80.0000 mg | ORAL_TABLET | Freq: Every day | ORAL | Status: DC
  Administered 2023-04-25: 80 mg via ORAL
  Filled 2023-04-24: qty 2

## 2023-04-24 MED ORDER — ASPIRIN 325 MG PO TABS
325.0000 mg | ORAL_TABLET | Freq: Every day | ORAL | Status: DC
  Administered 2023-04-24: 325 mg via ORAL

## 2023-04-24 MED ORDER — METOPROLOL TARTRATE 25 MG PO TABS
25.0000 mg | ORAL_TABLET | Freq: Two times a day (BID) | ORAL | Status: DC
  Administered 2023-04-24 – 2023-04-25 (×3): 25 mg via ORAL
  Filled 2023-04-24 (×3): qty 1

## 2023-04-24 MED ORDER — FUROSEMIDE 10 MG/ML IJ SOLN
20.0000 mg | Freq: Once | INTRAMUSCULAR | Status: AC
  Administered 2023-04-24: 20 mg via INTRAVENOUS
  Filled 2023-04-24: qty 2

## 2023-04-24 MED ORDER — PANTOPRAZOLE SODIUM 40 MG PO TBEC
40.0000 mg | DELAYED_RELEASE_TABLET | Freq: Every day | ORAL | Status: DC
  Administered 2023-04-25 – 2023-04-30 (×6): 40 mg via ORAL
  Filled 2023-04-24 (×6): qty 1

## 2023-04-24 MED ORDER — ASPIRIN 325 MG PO TABS: 325.0000 mg | ORAL_TABLET | Freq: Every day | ORAL | Status: DC

## 2023-04-24 MED ORDER — SERTRALINE HCL 50 MG PO TABS: 25.0000 mg | ORAL_TABLET | Freq: Every day | ORAL | Status: DC

## 2023-04-24 MED ORDER — ONDANSETRON HCL 4 MG/2ML IJ SOLN: 4.0000 mg | Freq: Four times a day (QID) | INTRAMUSCULAR | Status: DC | PRN

## 2023-04-24 NOTE — ED Provider Triage Note (Signed)
Emergency Medicine Provider Triage Evaluation Note  Jaime Fuller , a 73 y.o. female  was evaluated in triage.  Pt complains of remittent chest pain, currently having mid chest pain does not radiate, reports it feels like indigestion, was sent by urgent care for irregular EKG.  Patient is having PVCs every other beat.  Review of Systems  Positive: CP Negative: SOB  Physical Exam  BP 135/80 (BP Location: Right Arm)   Pulse (!) 29   Temp 98.9 F (37.2 C) (Oral)   Resp 14   Ht 5\' 2"  (1.575 m)   Wt 93.4 kg   SpO2 98%   BMI 37.68 kg/m  Gen:   Awake, no distress   Resp:  Normal effort  MSK:   Moves extremities without difficulty  Other:    Medical Decision Making  Medically screening exam initiated at 8:33 PM.  Appropriate orders placed.  Jaime Fuller was informed that the remainder of the evaluation will be completed by another provider, this initial triage assessment does not replace that evaluation, and the importance of remaining in the ED until their evaluation is complete.     Smitty Knudsen, Cordelia Poche 04/24/23 2036

## 2023-04-24 NOTE — H&P (Signed)
Cardiology Admission History and Physical   Patient ID: Jaime Fuller MRN: [id number]; DOB: 1950-12-15   Admission date: 04/24/2023  PCP:  Philip Aspen, Limmie Patricia, MD    [facility] Providers Cardiologist:  Meriam Sprague, MD (Inactive)        Chief Complaint:  Chest pain, SOB  Patient Profile:   Jaime Fuller is a 73 y.o. female with hypothyroidism, obesity, anxiety, history of PVCs, mild nonobstructive CAD on CT coronaries 2022, GERD,, CKD stage IIIb who is being seen 04/25/2023 for the evaluation of chest pain and shortness of breath and dyspnea on exertion.  History of Present Illness:   Jaime Fuller is a 73 y.o. female with hypothyroidism, obesity, anxiety, history of PVCs, mild nonobstructive CAD on CT coronaries 2022, GERD,, CKD stage IIIb who is being seen 04/24/2023 for the evaluation of chest pain and shortness of breath and dyspnea on exertion.   Patient is not a good historian, downplays her symptoms.  Her daughter was at bedside and as well as patient she has been having exertional symptoms associated with some mild shortness of breath and extreme fatigue, dyspnea on exertion since 5 months.  She has not gone to doctors about this, on and off indigestion-like symptoms for which she took amitriptyline but did not see any help.  She also has some shortness of breath with minimal activity but denies any orthopnea or PND but does have lower extremity swelling for which she takes low-dose furosemide as needed. Today she went to urgent care because husband had COVID like symptoms and tested positive, she tested negative for report and then started having chest tightness symptoms for which EKG was done which showed bigeminy and patient was transported here  Upon arrival EKG shows normal sinus rhythm with bigeminy, frequent PVCs morphology is kind of outflow tract, nonspecific ST-T wave changes, initial troponin 30, > 127. Patient had 2 episodes of chest pain  which relieved with nitroglycerin Chest x-ray shows mild pulmonary edema.  NT proBNP is 248 She also has AKI with creatinine of 1.31   Relevant CV Studies: Echo 2022 IMPRESSIONS     1. Left ventricular ejection fraction, by estimation, is 60 to 65%. The  left ventricle has normal function. The left ventricle has no regional  wall motion abnormalities. Left ventricular diastolic parameters were  normal.   2. Right ventricular systolic function is normal. The right ventricular  size is normal. There is normal pulmonary artery systolic pressure.   3. The mitral valve is normal in structure. Trivial mitral valve  regurgitation. No evidence of mitral stenosis.   4. The aortic valve is normal in structure. Aortic valve regurgitation is  not visualized. No aortic stenosis is present.   5. The inferior vena cava is normal in size with greater than 50%  respiratory variability, suggesting right atrial pressure of 3 mmHg.   CCTA 2022 IMPRESSION: 1. Coronary calcium score of 11. This was 53rd percentile for age and sex matched control.   2. Normal coronary origin with right dominance.   3. Nonobstructive CAD.   4. Calcified plaque in the proximal LAD causes minimal (0-24%) stenosis  Past Medical History:  Diagnosis Date   Anxiety state, unspecified 09/20/2007   Arthritis    Cataract    Chronic kidney disease    kidney stones   HYPOTHYROIDISM 03/08/2007   no meds needed now 04-15-19   Pancreatitis    secondary to ERCP    Past Surgical History:  Procedure  Laterality Date   abdominal plasty     Carpel tunnel surgery left hand     Childbirth x 2     CHOLECYSTECTOMY     GASTRIC BYPASS     UPPER GASTROINTESTINAL ENDOSCOPY     WISDOM TOOTH EXTRACTION       Medications Prior to Admission: Prior to Admission medications   Medication Sig Start Date End Date Taking? Authorizing Provider  amitriptyline (ELAVIL) 25 MG tablet TAKE 1 TABLET(25 MG) BY MOUTH AT BEDTIME 07/12/22    Armbruster, Willaim Rayas, MD  calcium carbonate (TUMS EX) 750 MG chewable tablet Chew 1-3 tablets by mouth as needed for heartburn.    [provider]  dexlansoprazole (DEXILANT) 60 MG capsule Take 1 capsule (60 mg total) by mouth daily. 07/20/21   Tressia Danas, MD  EPINEPHrine 0.3 mg/0.3 mL IJ SOAJ injection Inject 0.3 mLs (0.3 mg total) into the muscle as needed for anaphylaxis. 04/03/19   Philip Aspen, Limmie Patricia, MD  famotidine (PEPCID) 20 MG tablet TAKE 1 TABLET TWICE A DAY 01/04/22   Tressia Danas, MD  furosemide (LASIX) 20 MG tablet Take 20 mg by mouth three times weekly as needed for lower extremity swelling. 09/22/20   Meriam Sprague, MD  HYDROcodone-acetaminophen (NORCO/VICODIN) 5-325 MG tablet Take 1 tablet by mouth as needed for moderate pain.    [provider]  levothyroxine (SYNTHROID) 50 MCG tablet TAKE 1 TABLET DAILY 04/06/22   Philip Aspen, Limmie Patricia, MD  sertraline (ZOLOFT) 25 MG tablet TAKE 1 TABLET(25 MG) BY MOUTH DAILY 07/12/22   Philip Aspen, Limmie Patricia, MD  traMADol (ULTRAM) 50 MG tablet Take 50 mg by mouth as needed.    [provider]  valACYclovir (VALTREX) 1000 MG tablet ONE TABLET TWICE A DAY X 5 DAYS AS NEEDED FOR OUTBREAK 05/27/19   Philip Aspen, Limmie Patricia, MD     Allergies:    Allergies  Allergen Reactions   Bee Venom Anaphylaxis   Vicodin [Hydrocodone-Acetaminophen] Itching    Social History:   Social History   Socioeconomic History   Marital status: Married    Spouse name: Not on file   Number of children: Not on file   Years of education: Not on file   Highest education level: Not on file  Occupational History   Not on file  Tobacco Use   Smoking status: Former    Passive exposure: Past   Smokeless tobacco: Never  Vaping Use   Vaping status: Never Used  Substance and Sexual Activity   Alcohol use: No    Alcohol/week: 0.0 standard drinks of alcohol   Drug use: No   Sexual activity: Not Currently   Other Topics Concern   Not on file  Social History Narrative   Not on file   Social Drivers of Health   Financial Resource Strain: Not on file  Food Insecurity: Not on file  Transportation Needs: Not on file  Physical Activity: Not on file  Stress: Not on file  Social Connections: Unknown (07/25/2021)   Received from [facility]   Social Network    Social Network: Not on file  Intimate Partner Violence: Unknown (06/17/2021)   Received from [facility], [facility]   HITS    Physically Hurt: Not on file    Insult or Talk Down To: Not on file    Threaten Physical Harm: Not on file    Scream or Curse: Not on file    Family History:   The  patient's family history includes Heart attack in her father; Lung cancer in her father; Thyroid disease in her mother. There is no history of Colon cancer, Esophageal cancer, Rectal cancer, or Stomach cancer.    ROS:  Please see the history of present illness.  All other ROS reviewed and negative.     Physical Exam/Data:   Vitals:   04/24/23 2218 04/24/23 2230 04/24/23 2303 04/24/23 2354  BP: 131/74 131/72 (!) 112/59 (!) 124/57  Pulse:  85  84  Resp: 13 16 19    Temp:      TempSrc:      SpO2:  99%    Weight:      Height:       No intake or output data in the 24 hours ending 04/25/23 0004    04/24/2023    8:27 PM 07/26/2021    2:42 PM 07/20/2021    9:11 AM  Last 3 Weights  Weight (lbs) 206 lb 192 lb 192 lb 9.6 oz  Weight (kg) 93.441 kg 87.091 kg 87.363 kg     Body mass index is 37.68 kg/m.  General:  Well nourished, well developed, in no acute distress HEENT: normal Neck:  JVD++ Vascular: No carotid bruits; Distal pulses 2+ bilaterally   Cardiac:  normal S1, S2; RRR; no murmur , frequent PVCs Lungs:  mild crackles+ Abd: soft, nontender, no hepatomegaly  Ext: 1+ edema Musculoskeletal:  No deformities, BUE and BLE strength normal and equal Skin: warm and dry  Neuro:  CNs 2-12 intact, no focal abnormalities  noted Psych:  Normal affect    EKG:  The ECG that was done  was personally reviewed and demonstrates ventricular bigeminy, normal sinus rhythm    Laboratory Data:  High Sensitivity Troponin:   Recent Labs  Lab 04/24/23 2041 04/24/23 2224  TROPONINIHS 30* 127*      Chemistry Recent Labs  Lab 04/24/23 2041  NA 139  K 4.5  CL 106  CO2 22  GLUCOSE 105*  BUN 21  CREATININE 1.31*  CALCIUM 8.8*  MG 2.4  GFRNONAA 44*  ANIONGAP 11    Recent Labs  Lab 04/24/23 2224  PROT 6.7  ALBUMIN 3.8  AST 28  ALT 29  ALKPHOS 61  BILITOT 0.6   Lipids No results for input(s): "CHOL", "TRIG", "HDL", "LABVLDL", "LDLCALC", "CHOLHDL" in the last 168 hours. Hematology Recent Labs  Lab 04/24/23 2041  WBC 6.7  RBC 4.74  HGB 13.6  HCT 41.9  MCV 88.4  MCH 28.7  MCHC 32.5  RDW 13.5  PLT 244   Thyroid  Recent Labs  Lab 04/24/23 2224  TSH 5.204*   BNP Recent Labs  Lab 04/24/23 2041  BNP 248.1*    DDimer No results for input(s): "DDIMER" in the last 168 hours.   Radiology/Studies:  DG Chest 2 View Result Date: 04/24/2023 CLINICAL DATA:  Chest pain EXAM: CHEST - 2 VIEW COMPARISON:  01/07/2016 FINDINGS: No consolidation or pleural effusion. Mild lower lung bronchitic changes. Normal cardiac size. No pneumothorax. Postsurgical changes at the epigastrium with some air distended bowel in the left upper quadrant IMPRESSION: No active cardiopulmonary disease. Mild lower lung bronchitic changes. Air distended bowel in the left upper quadrant Electronically Signed   By: Jasmine Pang M.D.   On: 04/24/2023 21:08     Assessment and Plan:   Chest pain/NSTEMI DOE SOB- mild CHF,  AKI on CKD Frequent PVCs, bigeminy, outflow tract morphology Morbid obesity. Hypertension, hyperlipidemia, possible sleep apnea, CKD stage IIIb  Plan: -Admit to cardiology. N.p.o. after midnight for cardiac catheterization in AM. -Trend troponin, EKG, obtain echocardiogram in a.m.  -Continue aspirin  81 mg, status post full dose aspirin and nitro x 2 with relief of chest pain, if recurrent chest pain put her on nitro drip.  Give her statin 80 mg, metoprolol 25 mg twice daily. -Check all labs -Get outpatient tested for sleep apnea  -On exam she is mildly volume overloaded I will give her 1 dose of IV Lasix 20 mg now, can add additional Lasix in a.m. -AKI is likely cardia renal -Continue home medication.  Full code  Risk Assessment/Risk Scores:    TIMI Risk Score for Unstable Angina or Non-ST Elevation MI:   The patient's TIMI risk score is 5, which indicates a 26% risk of all cause mortality, new or recurrent myocardial infarction or need for urgent revascularization in the next 14 days.  New York Heart Association (NYHA) Functional Class NYHA Class II    Code Status: Full Code  Severity of Illness: The appropriate patient status for this patient is INPATIENT. Inpatient status is judged to be reasonable and necessary in order to provide the required intensity of service to ensure the patient's safety. The patient's presenting symptoms, physical exam findings, and initial radiographic and laboratory data in the context of their chronic comorbidities is felt to place them at high risk for further clinical deterioration. Furthermore, it is not anticipated that the patient will be medically stable for discharge from the hospital within 2 midnights of admission.   * I certify that at the point of admission it is my clinical judgment that the patient will require inpatient hospital care spanning beyond 2 midnights from the point of admission due to high intensity of service, high risk for further deterioration and high frequency of surveillance required.*   For questions or updates, please contact [facility] Please consult [email or web address] for contact info under     Signed, Elmon Kirschner, MD  04/25/2023 12:04 AM

## 2023-04-24 NOTE — ED Triage Notes (Signed)
Pt to ED c/o intermittent CP x 5 months, progressively getting worse, SHOB with exertion. Reports went to UC today due to COVID exposure, COVID test was negative, but was told to come to ED d/t EKG concerns. Pt currently c/o CP.

## 2023-04-24 NOTE — ED Provider Notes (Signed)
[place] EMERGENCY DEPARTMENT AT [facility] Provider Note   CSN: [id number] Arrival date & time: 04/24/23  1955     History  Chief Complaint  Patient presents with   Chest Pain    Jaime Fuller is a 73 y.o. female.  The history is provided by the patient, medical records and a relative. No language interpreter was used.  Chest Pain Pain location:  L chest and substernal area Pain quality: aching, dull and pressure   Pain radiates to:  Does not radiate Pain severity:  Moderate Onset quality:  Gradual Timing:  Intermittent Progression:  Waxing and waning Chronicity:  New Relieved by:  Nothing Worsened by:  Nothing Ineffective treatments:  None tried Associated symptoms: fatigue, lower extremity edema, nausea, palpitations and shortness of breath   Associated symptoms: no abdominal pain, no altered mental status, no back pain, no cough, no fever, no headache and no vomiting        Home Medications Prior to Admission medications   Medication Sig Start Date End Date Taking? Authorizing Provider  amitriptyline (ELAVIL) 25 MG tablet TAKE 1 TABLET(25 MG) BY MOUTH AT BEDTIME 07/12/22   Armbruster, Willaim Rayas, MD  calcium carbonate (TUMS EX) 750 MG chewable tablet Chew 1-3 tablets by mouth as needed for heartburn.    [provider]  dexlansoprazole (DEXILANT) 60 MG capsule Take 1 capsule (60 mg total) by mouth daily. 07/20/21   Tressia Danas, MD  EPINEPHrine 0.3 mg/0.3 mL IJ SOAJ injection Inject 0.3 mLs (0.3 mg total) into the muscle as needed for anaphylaxis. 04/03/19   Philip Aspen, Limmie Patricia, MD  famotidine (PEPCID) 20 MG tablet TAKE 1 TABLET TWICE A DAY 01/04/22   Tressia Danas, MD  furosemide (LASIX) 20 MG tablet Take 20 mg by mouth three times weekly as needed for lower extremity swelling. 09/22/20   Meriam Sprague, MD  HYDROcodone-acetaminophen (NORCO/VICODIN) 5-325 MG tablet Take 1 tablet by mouth as needed for moderate pain.    [provider]  levothyroxine (SYNTHROID) 50 MCG tablet TAKE 1 TABLET DAILY 04/06/22   Philip Aspen, Limmie Patricia, MD  sertraline (ZOLOFT) 25 MG tablet TAKE 1 TABLET(25 MG) BY MOUTH DAILY 07/12/22   Philip Aspen, Limmie Patricia, MD  traMADol (ULTRAM) 50 MG tablet Take 50 mg by mouth as needed.    [provider]  valACYclovir (VALTREX) 1000 MG tablet ONE TABLET TWICE A DAY X 5 DAYS AS NEEDED FOR OUTBREAK 05/27/19   Philip Aspen, Limmie Patricia, MD      Allergies    Bee venom and Vicodin [hydrocodone-acetaminophen]    Review of Systems   Review of Systems  Constitutional:  Positive for fatigue. Negative for chills and fever.  HENT:  Positive for congestion.   Respiratory:  Positive for chest tightness and shortness of breath. Negative for cough and wheezing.   Cardiovascular:  Positive for chest pain, palpitations and leg swelling.  Gastrointestinal:  Positive for nausea. Negative for abdominal pain, constipation, diarrhea and vomiting.  Genitourinary:  Negative for dysuria.  Musculoskeletal:  Negative for back pain and neck pain.  Skin:  Negative for rash.  Neurological:  Negative for headaches.  Psychiatric/Behavioral:  Negative for agitation.   All other systems reviewed and are negative.   Physical Exam Updated Vital Signs BP 135/80 (BP Location: Right Arm)   Pulse (!) 29   Temp 98.9 F (37.2 C) (Oral)   Resp 14   Ht 5\' 2"  (1.575 m)   Wt 93.4 kg  SpO2 98%   BMI 37.68 kg/m  Physical Exam Vitals and nursing note reviewed.  Constitutional:      General: She is not in acute distress.    Appearance: She is well-developed. She is not ill-appearing, toxic-appearing or diaphoretic.  HENT:     Head: Normocephalic and atraumatic.  Eyes:     Conjunctiva/sclera: Conjunctivae normal.     Pupils: Pupils are equal, round, and reactive to light.  Cardiovascular:     Rate and Rhythm: Normal rate and regular rhythm.     Heart sounds: Normal heart sounds. No murmur  heard. Pulmonary:     Effort: Pulmonary effort is normal. No respiratory distress.     Breath sounds: Normal breath sounds. No wheezing, rhonchi or rales.  Chest:     Chest wall: No tenderness.  Abdominal:     Palpations: Abdomen is soft.     Tenderness: There is no abdominal tenderness.  Musculoskeletal:        General: No swelling.     Cervical back: Neck supple.     Right lower leg: Edema present.     Left lower leg: Edema present.  Skin:    General: Skin is warm and dry.     Capillary Refill: Capillary refill takes less than 2 seconds.     Findings: No erythema.  Neurological:     General: No focal deficit present.     Mental Status: She is alert.  Psychiatric:        Mood and Affect: Mood normal.     ED Results / Procedures / Treatments   Labs (all labs ordered are listed, but only abnormal results are displayed) Labs Reviewed  BASIC METABOLIC PANEL - Abnormal; Notable for the following components:      Result Value   Glucose, Bld 105 (*)    Creatinine, Ser 1.31 (*)    Calcium 8.8 (*)    GFR, Estimated 44 (*)    All other components within normal limits  BRAIN NATRIURETIC PEPTIDE - Abnormal; Notable for the following components:   B Natriuretic Peptide 248.1 (*)    All other components within normal limits  TROPONIN I (HIGH SENSITIVITY) - Abnormal; Notable for the following components:   Troponin I (High Sensitivity) 30 (*)    All other components within normal limits  TROPONIN I (HIGH SENSITIVITY) - Abnormal; Notable for the following components:   Troponin I (High Sensitivity) 127 (*)    All other components within normal limits  CBC  MAGNESIUM  HEPATIC FUNCTION PANEL  TSH    EKG EKG Interpretation Date/Time:  Tuesday April 24 2023 20:19:13 EST Ventricular Rate:  86 PR Interval:  152 QRS Duration:  86 QT Interval:  374 QTC Calculation: 447 R Axis:   31  Text Interpretation: Sinus rhythm with frequent Premature ventricular complexes in a pattern  of bigeminy Low voltage QRS Cannot rule out Anterior infarct , age undetermined Abnormal ECG When compared with ECG of 17-Sep-2020 11:41, PREVIOUS ECG IS PRESENT when compared to prior, now bigeminy from trigeminy. No STEMI Confirmed by Theda Belfast ([id number]) on 04/24/2023 8:53:00 PM  Radiology DG Chest 2 View Result Date: 04/24/2023 CLINICAL DATA:  Chest pain EXAM: CHEST - 2 VIEW COMPARISON:  01/07/2016 FINDINGS: No consolidation or pleural effusion. Mild lower lung bronchitic changes. Normal cardiac size. No pneumothorax. Postsurgical changes at the epigastrium with some air distended bowel in the left upper quadrant IMPRESSION: No active cardiopulmonary disease. Mild lower lung bronchitic changes. Air distended bowel in  the left upper quadrant Electronically Signed   By: Jasmine Pang M.D.   On: 04/24/2023 21:08    Procedures Procedures    Medications Ordered in ED Medications  nitroGLYCERIN (NITROSTAT) SL tablet 0.4 mg (0.4 mg Sublingual Given 04/24/23 2220)  aspirin tablet 325 mg (has no administration in time range)  furosemide (LASIX) injection 20 mg (has no administration in time range)    ED Course/ Medical Decision Making/ A&P                                 Medical Decision Making Amount and/or Complexity of Data Reviewed Labs: ordered. Radiology: ordered.  Risk Prescription drug management.    MIREILLE LACOMBE is a 73 y.o. female with a past medical history significant for hypothyroidism, previous pancreatitis, anemia, previous gastric bypass surgery and cholecystectomy, and CKD who presents with worsening peripheral edema exertional shortness of breath and some chest discomfort.  According to patient, she has had shortness breath with exertion worsening recently but is had some short of breath over months.  She reports he did have some chest discomfort on and off.  She reports that her legs have been more edematous over the last week or so and her husband tested positive for  COVID.  Patient says that she took her husband to get evaluated for URI symptoms and she has had some congestion as well and mild cough but started having more chest discomfort.  On exam, lungs were clear.  Chest was nontender.  Abdomen nontender.  Patient does have edema in her legs.  Patient resting comfortably with reassuring vital signs initially.  Patient reports her chest pain was a 5 out of 10 and with the nitro sublingually, her pain resolved completely.  Patient had workup that showed elevated troponin of 30, will trend.  BNP also more elevated than prior at 248.  CBC reassuring and metabolic panel shows slight elevation of creatinine up to 1.31.  TSH still in process as is hepatic function.  X-ray did not show pneumonia.  Due to the patient's elevated troponin, chest discomfort better with nitro, BNP elevation, and exertional shortness of breath discomfort and edema, cardiology was called.  They will come see patient to determine if she needs admission to their service or not.  Care transferred to oncoming team to await cardiology recommendations and workup to be completed.  11:13 PM Second troponin has risen to 127.  Anticipate cardiology evaluation and admission.          Final Clinical Impression(s) / ED Diagnoses Final diagnoses:  Nonspecific chest pain  Elevated troponin   Clinical Impression: 1. Nonspecific chest pain   2. Elevated troponin     Disposition: Admit  This note was prepared with assistance of Dragon voice recognition software. Occasional wrong-word or sound-a-like substitutions may have occurred due to the inherent limitations of voice recognition software.     Meli Faley, Canary Brim, MD 04/24/23 [phone]

## 2023-04-25 ENCOUNTER — Inpatient Hospital Stay (HOSPITAL_COMMUNITY): Payer: Medicare Other

## 2023-04-25 ENCOUNTER — Encounter (HOSPITAL_COMMUNITY)
Admission: EM | Disposition: A | Discharge status: Home / Self Care | Payer: Self-pay | Source: Home / Self Care | Attending: Cardiology

## 2023-04-25 ENCOUNTER — Encounter (HOSPITAL_COMMUNITY): Payer: Self-pay | Admitting: Cardiovascular Disease

## 2023-04-25 DIAGNOSIS — I214 Non-ST elevation (NSTEMI) myocardial infarction: Secondary | ICD-10-CM

## 2023-04-25 HISTORY — PX: LEFT HEART CATH AND CORONARY ANGIOGRAPHY: CATH118249

## 2023-04-25 LAB — CBC WITH DIFFERENTIAL/PLATELET
Abs Immature Granulocytes: 0.01 10*3/uL (ref 0.00–0.07)
Basophils Absolute: 0.1 10*3/uL (ref 0.0–0.1)
Basophils Relative: 1 %
Eosinophils Absolute: 0.1 10*3/uL (ref 0.0–0.5)
Eosinophils Relative: 1 %
HCT: 40.8 % (ref 36.0–46.0)
Hemoglobin: 13.2 g/dL (ref 12.0–15.0)
Immature Granulocytes: 0 %
Lymphocytes Relative: 32 %
Lymphs Abs: 2.1 10*3/uL (ref 0.7–4.0)
MCH: 28.5 pg (ref 26.0–34.0)
MCHC: 32.4 g/dL (ref 30.0–36.0)
MCV: 88.1 fL (ref 80.0–100.0)
Monocytes Absolute: 0.5 10*3/uL (ref 0.1–1.0)
Monocytes Relative: 7 %
Neutro Abs: 3.9 10*3/uL (ref 1.7–7.7)
Neutrophils Relative %: 59 %
Platelets: 242 10*3/uL (ref 150–400)
RBC: 4.63 MIL/uL (ref 3.87–5.11)
RDW: 13.8 % (ref 11.5–15.5)
WBC: 6.6 10*3/uL (ref 4.0–10.5)
nRBC: 0 % (ref 0.0–0.2)

## 2023-04-25 LAB — ECHOCARDIOGRAM COMPLETE
Area-P 1/2: 3.28 cm2
Calc EF: 45.7 %
Height: 62 in
S' Lateral: 3.9 cm
Single Plane A2C EF: 41.2 %
Single Plane A4C EF: 38.2 %
Weight: 3296 [oz_av]

## 2023-04-25 LAB — BASIC METABOLIC PANEL
Anion gap: 17 — ABNORMAL HIGH (ref 5–15)
BUN: 18 mg/dL (ref 8–23)
CO2: 22 mmol/L (ref 22–32)
Calcium: 9.3 mg/dL (ref 8.9–10.3)
Chloride: 101 mmol/L (ref 98–111)
Creatinine, Ser: 1.08 mg/dL — ABNORMAL HIGH (ref 0.44–1.00)
GFR, Estimated: 55 mL/min — ABNORMAL LOW (ref >60)
Glucose, Bld: 102 mg/dL — ABNORMAL HIGH (ref 70–99)
Potassium: 3.9 mmol/L (ref 3.5–5.1)
Sodium: 140 mmol/L (ref 135–145)

## 2023-04-25 LAB — TSH: TSH: 4.826 u[IU]/mL — ABNORMAL HIGH (ref 0.350–4.500)

## 2023-04-25 LAB — HEPARIN LEVEL (UNFRACTIONATED): Heparin Unfractionated: 0.39 [IU]/mL (ref 0.30–0.70)

## 2023-04-25 LAB — LIPID PANEL
Cholesterol: 120 mg/dL (ref 0–200)
HDL: 56 mg/dL (ref >40)
LDL Cholesterol: 59 mg/dL (ref 0–99)
Total CHOL/HDL Ratio: 2.1 {ratio}
Triglycerides: 23 mg/dL (ref <150)
VLDL: 5 mg/dL (ref 0–40)

## 2023-04-25 LAB — CBC
HCT: 43.5 % (ref 36.0–46.0)
Hemoglobin: 13.6 g/dL (ref 12.0–15.0)
MCH: 28.5 pg (ref 26.0–34.0)
MCHC: 31.3 g/dL (ref 30.0–36.0)
MCV: 91 fL (ref 80.0–100.0)
Platelets: 177 10*3/uL (ref 150–400)
RBC: 4.78 MIL/uL (ref 3.87–5.11)
RDW: 13.5 % (ref 11.5–15.5)
WBC: 5 10*3/uL (ref 4.0–10.5)
nRBC: 0 % (ref 0.0–0.2)

## 2023-04-25 LAB — MAGNESIUM: Magnesium: 2.2 mg/dL (ref 1.7–2.4)

## 2023-04-25 LAB — CREATININE, SERUM
Creatinine, Ser: 1.25 mg/dL — ABNORMAL HIGH (ref 0.44–1.00)
GFR, Estimated: 46 mL/min — ABNORMAL LOW (ref >60)

## 2023-04-25 LAB — T4, FREE: Free T4: 1.11 ng/dL (ref 0.61–1.12)

## 2023-04-25 LAB — TROPONIN I (HIGH SENSITIVITY): Troponin I (High Sensitivity): 293 ng/L (ref <18)

## 2023-04-25 SURGERY — LEFT HEART CATH AND CORONARY ANGIOGRAPHY
Anesthesia: LOCAL

## 2023-04-25 MED ORDER — HEPARIN SODIUM (PORCINE) 1000 UNIT/ML IJ SOLN
INTRAMUSCULAR | Status: AC
  Filled 2023-04-25: qty 10

## 2023-04-25 MED ORDER — HYDRALAZINE HCL 20 MG/ML IJ SOLN: 10.0000 mg | INTRAMUSCULAR | Status: AC | PRN

## 2023-04-25 MED ORDER — MIDAZOLAM HCL 2 MG/2ML IJ SOLN
INTRAMUSCULAR | Status: DC | PRN
  Administered 2023-04-25 (×2): 1 mg via INTRAVENOUS

## 2023-04-25 MED ORDER — HEPARIN (PORCINE) IN NACL 1000-0.9 UT/500ML-% IV SOLN
INTRAVENOUS | Status: DC | PRN
  Administered 2023-04-25 (×2): 500 mL

## 2023-04-25 MED ORDER — PERFLUTREN LIPID MICROSPHERE
1.0000 mL | INTRAVENOUS | Status: AC | PRN
  Administered 2023-04-25: 5 mL via INTRAVENOUS

## 2023-04-25 MED ORDER — ENOXAPARIN SODIUM 40 MG/0.4ML IJ SOSY: 40.0000 mg | PREFILLED_SYRINGE | INTRAMUSCULAR | Status: DC

## 2023-04-25 MED ORDER — SODIUM CHLORIDE 0.9% FLUSH
3.0000 mL | Freq: Two times a day (BID) | INTRAVENOUS | Status: DC
  Administered 2023-04-25 – 2023-04-30 (×9): 3 mL via INTRAVENOUS

## 2023-04-25 MED ORDER — SODIUM CHLORIDE 0.9% FLUSH: 3.0000 mL | INTRAVENOUS | Status: DC | PRN

## 2023-04-25 MED ORDER — LIDOCAINE HCL (PF) 1 % IJ SOLN
INTRAMUSCULAR | Status: DC | PRN
  Administered 2023-04-25: 2 mL

## 2023-04-25 MED ORDER — ASPIRIN 81 MG PO CHEW: 81.0000 mg | CHEWABLE_TABLET | ORAL | Status: DC

## 2023-04-25 MED ORDER — HEPARIN (PORCINE) 25000 UT/250ML-% IV SOLN
850.0000 [IU]/h | INTRAVENOUS | Status: DC
Start: 2023-04-25 — End: 2023-04-25
  Administered 2023-04-25: 850 [IU]/h via INTRAVENOUS
  Filled 2023-04-25: qty 250

## 2023-04-25 MED ORDER — NITROGLYCERIN 2 % TD OINT
0.5000 [in_us] | TOPICAL_OINTMENT | Freq: Four times a day (QID) | TRANSDERMAL | Status: DC
  Administered 2023-04-25: 0.5 [in_us] via TOPICAL
  Filled 2023-04-25: qty 1

## 2023-04-25 MED ORDER — SODIUM CHLORIDE 0.9 % IV SOLN: 250.0000 mL | INTRAVENOUS | Status: AC | PRN

## 2023-04-25 MED ORDER — MIDAZOLAM HCL 2 MG/2ML IJ SOLN
INTRAMUSCULAR | Status: AC
  Filled 2023-04-25: qty 2

## 2023-04-25 MED ORDER — HEPARIN BOLUS VIA INFUSION
4000.0000 [IU] | Freq: Once | INTRAVENOUS | Status: AC
  Administered 2023-04-25: 4000 [IU] via INTRAVENOUS
  Filled 2023-04-25: qty 4000

## 2023-04-25 MED ORDER — VERAPAMIL HCL 2.5 MG/ML IV SOLN
INTRAVENOUS | Status: AC
  Filled 2023-04-25: qty 2

## 2023-04-25 MED ORDER — VERAPAMIL HCL 2.5 MG/ML IV SOLN
INTRAVENOUS | Status: DC | PRN
  Administered 2023-04-25: 10 mL via INTRA_ARTERIAL

## 2023-04-25 MED ORDER — LIDOCAINE HCL (CARDIAC) PF 100 MG/5ML IV SOSY
PREFILLED_SYRINGE | INTRAVENOUS | Status: AC
  Filled 2023-04-25: qty 5

## 2023-04-25 MED ORDER — IOHEXOL 350 MG/ML SOLN
INTRAVENOUS | Status: DC | PRN
  Administered 2023-04-25: 50 mL

## 2023-04-25 MED ORDER — SODIUM CHLORIDE 0.9 % WEIGHT BASED INFUSION
3.0000 mL/kg/h | INTRAVENOUS | Status: DC
  Administered 2023-04-25: 10 mL/kg/h via INTRAVENOUS
  Administered 2023-04-25: 3 mL/kg/h via INTRAVENOUS

## 2023-04-25 MED ORDER — HEPARIN SODIUM (PORCINE) 1000 UNIT/ML IJ SOLN
INTRAMUSCULAR | Status: DC | PRN
  Administered 2023-04-25: 5000 [IU] via INTRAVENOUS

## 2023-04-25 MED ORDER — FENTANYL CITRATE (PF) 100 MCG/2ML IJ SOLN
INTRAMUSCULAR | Status: DC | PRN
  Administered 2023-04-25 (×2): 25 ug via INTRAVENOUS

## 2023-04-25 MED ORDER — LABETALOL HCL 5 MG/ML IV SOLN: 10.0000 mg | INTRAVENOUS | Status: AC | PRN

## 2023-04-25 MED ORDER — FENTANYL CITRATE (PF) 100 MCG/2ML IJ SOLN
INTRAMUSCULAR | Status: AC
  Filled 2023-04-25: qty 2

## 2023-04-25 MED ORDER — SODIUM CHLORIDE 0.9 % WEIGHT BASED INFUSION
1.0000 mL/kg/h | INTRAVENOUS | Status: DC
  Administered 2023-04-25: 1 mL/kg/h via INTRAVENOUS

## 2023-04-25 SURGICAL SUPPLY — 9 items
CATH 5FR JL3.5 JR4 ANG PIG MP (CATHETERS) IMPLANT
DEVICE RAD COMP TR BAND LRG (VASCULAR PRODUCTS) IMPLANT
ELECT DEFIB PAD ADLT CADENCE (PAD) IMPLANT
GLIDESHEATH SLEND SS 6F .021 (SHEATH) IMPLANT
GUIDEWIRE INQWIRE 1.5J.035X260 (WIRE) IMPLANT
INQWIRE 1.5J .035X260CM (WIRE) ×1 IMPLANT
PACK CARDIAC CATHETERIZATION (CUSTOM PROCEDURE TRAY) ×1 IMPLANT
SET ATX-X65L (MISCELLANEOUS) IMPLANT
SHEATH PROBE COVER 6X72 (BAG) IMPLANT

## 2023-04-25 NOTE — ED Notes (Signed)
Nitroglycerin paste removed due to lower bp. Provider informed

## 2023-04-25 NOTE — Progress Notes (Signed)
  Echocardiogram 2D Echocardiogram has been performed.  Leda Roys RDCS 04/25/2023, 2:10 PM

## 2023-04-25 NOTE — Progress Notes (Signed)
PHARMACY - ANTICOAGULATION CONSULT NOTE  Pharmacy Consult for Heparin  Indication: chest pain/ACS  Allergies  Allergen Reactions   Bee Venom Anaphylaxis   Vicodin [Hydrocodone-Acetaminophen] Itching   Patient Measurements: Height: 5\' 2"  (157.5 cm) Weight: 93.4 kg (206 lb) IBW/kg (Calculated) : 50.1  Vital Signs: Temp: 98.9 F (37.2 C) (02/12 0045) Temp Source: Oral (02/12 0045) BP: 100/64 (02/12 0000) Pulse Rate: 58 (02/12 0045)  Labs: Recent Labs    04/24/23 2041 04/24/23 2224  HGB 13.6  --   HCT 41.9  --   PLT 244  --   CREATININE 1.31*  --   TROPONINIHS 30* 127*    Estimated Creatinine Clearance: 41.9 mL/min (A) (by C-G formula based on SCr of 1.31 mg/dL (H)).   Medical History: Past Medical History:  Diagnosis Date   Anxiety state, unspecified 09/20/2007   Arthritis    Cataract    Chronic kidney disease    kidney stones   HYPOTHYROIDISM 03/08/2007   no meds needed now 04-15-19   Pancreatitis    secondary to ERCP     Assessment: 73 y/o F with CP/NSTEMI. Starting heparin. Above labs reviewed. PTA meds reviewed. Possible cath today.   Goal of Therapy:  Heparin level 0.3-0.7 units/ml Monitor platelets by anticoagulation protocol: Yes   Plan:  Heparin 4000 units BOLUS Start heparin drip at 850 units/hr Heparin level in 8 hours Daily CBC/Heparin level Monitor for bleeding  Abran Duke, PharmD, BCPS Clinical Pharmacist Phone: [phone]

## 2023-04-25 NOTE — Interval H&P Note (Signed)
History and Physical Interval Note:  04/25/2023 3:41 PM  Jaime Fuller  has presented today for surgery, with the diagnosis of nstemi.  The various methods of treatment have been discussed with the patient and family. After consideration of risks, benefits and other options for treatment, the patient has consented to  Procedure(s): LEFT HEART CATH AND CORONARY ANGIOGRAPHY (N/A) as a surgical intervention.  The patient's history has been reviewed, patient examined, no change in status, stable for surgery.  I have reviewed the patient's chart and labs.  Questions were answered to the patient's satisfaction.     Tonny Bollman

## 2023-04-25 NOTE — Progress Notes (Addendum)
Rounding Note    Patient Name: Jaime Fuller Date of Encounter: 04/25/2023  [facility] Cardiologist: Dr. Clifton James   Subjective   Patient notes improvement and is feeling better. She reports her chest pain was immediately relieved with sublingual nitroglycerin slowly creeps back up. She reports a feeling of throat fullness. She was IV given lasix yesterday and note significant improvement in edema.   Inpatient Medications    Scheduled Meds:  aspirin EC  81 mg Oral Daily   atorvastatin  80 mg Oral Daily   levothyroxine  50 mcg Oral Daily   metoprolol tartrate  25 mg Oral BID   nitroGLYCERIN  0.5 inch Topical Q6H   pantoprazole  40 mg Oral Daily   Continuous Infusions:  heparin 850 Units/hr (04/25/23 0035)   PRN Meds: HYDROcodone-acetaminophen, nitroGLYCERIN, ondansetron (ZOFRAN) IV   Vital Signs    Vitals:   04/25/23 0635 04/25/23 0740 04/25/23 0800 04/25/23 0845  BP:  109/62 102/75 (!) 112/93  Pulse:  60 (!) 57 (!) 56  Resp:  16 18 16   Temp: 98.1 F (36.7 C)     TempSrc: Oral     SpO2:  98% 99% 98%  Weight:      Height:       No intake or output data in the 24 hours ending 04/25/23 0901    04/24/2023    8:27 PM 07/26/2021    2:42 PM 07/20/2021    9:11 AM  Last 3 Weights  Weight (lbs) 206 lb 192 lb 192 lb 9.6 oz  Weight (kg) 93.441 kg 87.091 kg 87.363 kg      Telemetry    NSR with occasional PVC, HR 70's  - Personally Reviewed  ECG    Initial EKG sinus rhythm w/bigeminy repeat this morning shows sinus rhythm, QT 513 - Personally Reviewed  Physical Exam   GEN: Laying in bed sitting upright with no acute distress.   Neck: No JVD Cardiac: RRR, no murmurs, rubs, or gallops.  Respiratory: Clear to auscultation bilaterally. GI: Soft, nontender, non-distended  MS: No edema; No deformity. Neuro:  Nonfocal  Psych: Normal affect   Labs    High Sensitivity Troponin:   Recent Labs  Lab 04/24/23 2041 04/24/23 2224 04/25/23 0413   TROPONINIHS 30* 127* 293*     Chemistry Recent Labs  Lab 04/24/23 2041 04/24/23 2224 04/25/23 0413  NA 139  --  140  K 4.5  --  3.9  CL 106  --  101  CO2 22  --  22  GLUCOSE 105*  --  102*  BUN 21  --  18  CREATININE 1.31*  --  1.08*  CALCIUM 8.8*  --  9.3  MG 2.4  --  2.2  PROT  --  6.7  --   ALBUMIN  --  3.8  --   AST  --  28  --   ALT  --  29  --   ALKPHOS  --  61  --   BILITOT  --  0.6  --   GFRNONAA 44*  --  55*  ANIONGAP 11  --  17*    Lipids  Recent Labs  Lab 04/25/23 0413  CHOL 120  TRIG 23  HDL 56  LDLCALC 59  CHOLHDL 2.1    Hematology Recent Labs  Lab 04/24/23 2041 04/25/23 0413  WBC 6.7 6.6  RBC 4.74 4.63  HGB 13.6 13.2  HCT 41.9 40.8  MCV 88.4 88.1  MCH 28.7  28.5  MCHC 32.5 32.4  RDW 13.5 13.8  PLT 244 242   Thyroid  Recent Labs  Lab 04/24/23 2224  TSH 4.826*  5.204*  FREET4 1.11    BNP Recent Labs  Lab 04/24/23 2041  BNP 248.1*    DDimer No results for input(s): "DDIMER" in the last 168 hours.   Radiology    DG Chest 2 View Result Date: 04/24/2023 CLINICAL DATA:  Chest pain EXAM: CHEST - 2 VIEW COMPARISON:  01/07/2016 FINDINGS: No consolidation or pleural effusion. Mild lower lung bronchitic changes. Normal cardiac size. No pneumothorax. Postsurgical changes at the epigastrium with some air distended bowel in the left upper quadrant IMPRESSION: No active cardiopulmonary disease. Mild lower lung bronchitic changes. Air distended bowel in the left upper quadrant Electronically Signed   By: Jasmine Pang M.D.   On: 04/24/2023 21:08    Cardiac Studies   Echo 2022 IMPRESSIONS     1. Left ventricular ejection fraction, by estimation, is 60 to 65%. The  left ventricle has normal function. The left ventricle has no regional  wall motion abnormalities. Left ventricular diastolic parameters were  normal.   2. Right ventricular systolic function is normal. The right ventricular  size is normal. There is normal pulmonary artery  systolic pressure.   3. The mitral valve is normal in structure. Trivial mitral valve  regurgitation. No evidence of mitral stenosis.   4. The aortic valve is normal in structure. Aortic valve regurgitation is  not visualized. No aortic stenosis is present.   5. The inferior vena cava is normal in size with greater than 50%  respiratory variability, suggesting right atrial pressure of 3 mmHg.    CCTA 2022 IMPRESSION: 1. Coronary calcium score of 11. This was 53rd percentile for age and sex matched control.   2. Normal coronary origin with right dominance.   3. Nonobstructive CAD.   4. Calcified plaque in the proximal LAD causes minimal (0-24%) stenosis  Patient Profile     73 y.o. female with hypothyroidism, obesity, anxiety, history of PVCs, mild nonobstructive CAD on CT coronaries 2022, GERD, CKD stage IIIb who was evaluated on 04/25/2023 for chest pain, shortness of breath and dyspnea on exertion.  Assessment & Plan    Chest pain/NSTEMI   - patient reports intermittent chest pain, DOE for the past couple of months. Also noted some fullness in her throat associated with chest heaviness. Thought this was GERD, but has tried several reflux meds without relief.  -- Presented to urgent care because husband had COVID-like symptoms and tested positive.  She tested negative and then developed chest tightness.  EKG at that time showed bigeminy and patient was advised to go to the ED -In ED, patient had 2 episodes of chest pain which was relieved with nitroglycerin and ASA 325 mg . - Initial VS: 135/80, HR 86, 98 % - hs TN 30 >127 >293 (today)  - ECHO pending (previous 10/2020: LVEF 60-65%)  - CCTA 09/2020: CCS of 11, nonobstructive CAD, Minimal Stenosis proximal LAD - Currently on ASA 81 mg, Lipitor 80 mg, Metoprolol 25 mg BID, IV heparin  - given concerns for ACS, will plan for cardiac catheterization today. Still having some throat fullness, therefore will add nitropaste   Informed  Consent   Shared Decision Making/Informed Consent The risks [stroke (1 in 1000), death (1 in 1000), kidney failure [usually temporary] (1 in 500), bleeding (1 in 200), allergic reaction [possibly serious] (1 in 200)], benefits (diagnostic support and management  of coronary artery disease) and alternatives of a cardiac catheterization were discussed in detail with Ms. Nordlund and she is willing to proceed.  SOB Acute HFpEF  - Exam in ED showed mildly volume overloaded. She was given IV lasix 20 mg. No I&O documented but reports UOP - BNP 248 - CXR: mild pulmonary edema - ECHO pending  - Cr 1.31 >1.08 (today)  - eGFR: 44 > 55 (today) - She appears euvolemic on exam. Can hold additional Lasix at this time.   Frequent PVC, bigemy, outflow tract morphology  - TSH: 4.826> 5.204, K WNL 3.9, MG WNL 2.2 - initial EKG: NSR with bigemy PVC HR 68 - EKG today: NSR, HR 57, prolonged QT   Prolonged QT interval 525 - EKG today shows Prolonged QT 525  - Suspect prolonged QT may be related to ischemia, but her Zoloft has also been held - Avoid QT prolonging medications   HLD  - LDL 59  - Currently on Lipitor 80mg    Possible Sleep Apnea  - will plan for outpatient sleep study evaluation  Acute on CK3B - Cr 1.31 >1.08  - eGFR: 44 > 55   Otherwise managed by PCP  - Morbid Obesity   For questions or updates, please contact [facility] Please consult [email or web address] for contact info under        Signed, Laverda Page, NP  04/25/2023, 9:01 AM     I have personally seen and examined this patient. I agree with the assessment and plan as outlined above.  Pt presenting with chest pain and dyspnea. Troponin is elevated. Agree that cardiac cath is indicated. Will plan cardiac cath today. Continue IV heparin. All questions answered.    Verne Carrow, MD, [facility] 04/25/2023 10:15 AM

## 2023-04-25 NOTE — H&P (View-Only) (Signed)
Rounding Note    Patient Name: Jaime Fuller Date of Encounter: 04/25/2023  [facility] Cardiologist: Dr. Clifton James   Subjective   Patient notes improvement and is feeling better. She reports her chest pain was immediately relieved with sublingual nitroglycerin slowly creeps back up. She reports a feeling of throat fullness. She was IV given lasix yesterday and note significant improvement in edema.   Inpatient Medications    Scheduled Meds:  aspirin EC  81 mg Oral Daily   atorvastatin  80 mg Oral Daily   levothyroxine  50 mcg Oral Daily   metoprolol tartrate  25 mg Oral BID   nitroGLYCERIN  0.5 inch Topical Q6H   pantoprazole  40 mg Oral Daily   Continuous Infusions:  heparin 850 Units/hr (04/25/23 0035)   PRN Meds: HYDROcodone-acetaminophen, nitroGLYCERIN, ondansetron (ZOFRAN) IV   Vital Signs    Vitals:   04/25/23 0635 04/25/23 0740 04/25/23 0800 04/25/23 0845  BP:  109/62 102/75 (!) 112/93  Pulse:  60 (!) 57 (!) 56  Resp:  16 18 16   Temp: 98.1 F (36.7 C)     TempSrc: Oral     SpO2:  98% 99% 98%  Weight:      Height:       No intake or output data in the 24 hours ending 04/25/23 0901    04/24/2023    8:27 PM 07/26/2021    2:42 PM 07/20/2021    9:11 AM  Last 3 Weights  Weight (lbs) 206 lb 192 lb 192 lb 9.6 oz  Weight (kg) 93.441 kg 87.091 kg 87.363 kg      Telemetry    NSR with occasional PVC, HR 70's  - Personally Reviewed  ECG    Initial EKG sinus rhythm w/bigeminy repeat this morning shows sinus rhythm, QT 513 - Personally Reviewed  Physical Exam   GEN: Laying in bed sitting upright with no acute distress.   Neck: No JVD Cardiac: RRR, no murmurs, rubs, or gallops.  Respiratory: Clear to auscultation bilaterally. GI: Soft, nontender, non-distended  MS: No edema; No deformity. Neuro:  Nonfocal  Psych: Normal affect   Labs    High Sensitivity Troponin:   Recent Labs  Lab 04/24/23 2041 04/24/23 2224 04/25/23 0413   TROPONINIHS 30* 127* 293*     Chemistry Recent Labs  Lab 04/24/23 2041 04/24/23 2224 04/25/23 0413  NA 139  --  140  K 4.5  --  3.9  CL 106  --  101  CO2 22  --  22  GLUCOSE 105*  --  102*  BUN 21  --  18  CREATININE 1.31*  --  1.08*  CALCIUM 8.8*  --  9.3  MG 2.4  --  2.2  PROT  --  6.7  --   ALBUMIN  --  3.8  --   AST  --  28  --   ALT  --  29  --   ALKPHOS  --  61  --   BILITOT  --  0.6  --   GFRNONAA 44*  --  55*  ANIONGAP 11  --  17*    Lipids  Recent Labs  Lab 04/25/23 0413  CHOL 120  TRIG 23  HDL 56  LDLCALC 59  CHOLHDL 2.1    Hematology Recent Labs  Lab 04/24/23 2041 04/25/23 0413  WBC 6.7 6.6  RBC 4.74 4.63  HGB 13.6 13.2  HCT 41.9 40.8  MCV 88.4 88.1  MCH 28.7  28.5  MCHC 32.5 32.4  RDW 13.5 13.8  PLT 244 242   Thyroid  Recent Labs  Lab 04/24/23 2224  TSH 4.826*  5.204*  FREET4 1.11    BNP Recent Labs  Lab 04/24/23 2041  BNP 248.1*    DDimer No results for input(s): "DDIMER" in the last 168 hours.   Radiology    DG Chest 2 View Result Date: 04/24/2023 CLINICAL DATA:  Chest pain EXAM: CHEST - 2 VIEW COMPARISON:  01/07/2016 FINDINGS: No consolidation or pleural effusion. Mild lower lung bronchitic changes. Normal cardiac size. No pneumothorax. Postsurgical changes at the epigastrium with some air distended bowel in the left upper quadrant IMPRESSION: No active cardiopulmonary disease. Mild lower lung bronchitic changes. Air distended bowel in the left upper quadrant Electronically Signed   By: Jasmine Pang M.D.   On: 04/24/2023 21:08    Cardiac Studies   Echo 2022 IMPRESSIONS     1. Left ventricular ejection fraction, by estimation, is 60 to 65%. The  left ventricle has normal function. The left ventricle has no regional  wall motion abnormalities. Left ventricular diastolic parameters were  normal.   2. Right ventricular systolic function is normal. The right ventricular  size is normal. There is normal pulmonary artery  systolic pressure.   3. The mitral valve is normal in structure. Trivial mitral valve  regurgitation. No evidence of mitral stenosis.   4. The aortic valve is normal in structure. Aortic valve regurgitation is  not visualized. No aortic stenosis is present.   5. The inferior vena cava is normal in size with greater than 50%  respiratory variability, suggesting right atrial pressure of 3 mmHg.    CCTA 2022 IMPRESSION: 1. Coronary calcium score of 11. This was 53rd percentile for age and sex matched control.   2. Normal coronary origin with right dominance.   3. Nonobstructive CAD.   4. Calcified plaque in the proximal LAD causes minimal (0-24%) stenosis  Patient Profile     73 y.o. female with hypothyroidism, obesity, anxiety, history of PVCs, mild nonobstructive CAD on CT coronaries 2022, GERD, CKD stage IIIb who was evaluated on 04/25/2023 for chest pain, shortness of breath and dyspnea on exertion.  Assessment & Plan    Chest pain/NSTEMI   - patient reports intermittent chest pain, DOE for the past couple of months. Also noted some fullness in her throat associated with chest heaviness. Thought this was GERD, but has tried several reflux meds without relief.  -- Presented to urgent care because husband had COVID-like symptoms and tested positive.  She tested negative and then developed chest tightness.  EKG at that time showed bigeminy and patient was advised to go to the ED -In ED, patient had 2 episodes of chest pain which was relieved with nitroglycerin and ASA 325 mg . - Initial VS: 135/80, HR 86, 98 % - hs TN 30 >127 >293 (today)  - ECHO pending (previous 10/2020: LVEF 60-65%)  - CCTA 09/2020: CCS of 11, nonobstructive CAD, Minimal Stenosis proximal LAD - Currently on ASA 81 mg, Lipitor 80 mg, Metoprolol 25 mg BID, IV heparin  - given concerns for ACS, will plan for cardiac catheterization today. Still having some throat fullness, therefore will add nitropaste   Informed  Consent   Shared Decision Making/Informed Consent The risks [stroke (1 in 1000), death (1 in 1000), kidney failure [usually temporary] (1 in 500), bleeding (1 in 200), allergic reaction [possibly serious] (1 in 200)], benefits (diagnostic support and management  of coronary artery disease) and alternatives of a cardiac catheterization were discussed in detail with Ms. Nordlund and she is willing to proceed.  SOB Acute HFpEF  - Exam in ED showed mildly volume overloaded. She was given IV lasix 20 mg. No I&O documented but reports UOP - BNP 248 - CXR: mild pulmonary edema - ECHO pending  - Cr 1.31 >1.08 (today)  - eGFR: 44 > 55 (today) - She appears euvolemic on exam. Can hold additional Lasix at this time.   Frequent PVC, bigemy, outflow tract morphology  - TSH: 4.826> 5.204, K WNL 3.9, MG WNL 2.2 - initial EKG: NSR with bigemy PVC HR 68 - EKG today: NSR, HR 57, prolonged QT   Prolonged QT interval 525 - EKG today shows Prolonged QT 525  - Suspect prolonged QT may be related to ischemia, but her Zoloft has also been held - Avoid QT prolonging medications   HLD  - LDL 59  - Currently on Lipitor 80mg    Possible Sleep Apnea  - will plan for outpatient sleep study evaluation  Acute on CK3B - Cr 1.31 >1.08  - eGFR: 44 > 55   Otherwise managed by PCP  - Morbid Obesity   For questions or updates, please contact [facility] Please consult [email or web address] for contact info under        Signed, Laverda Page, NP  04/25/2023, 9:01 AM     I have personally seen and examined this patient. I agree with the assessment and plan as outlined above.  Pt presenting with chest pain and dyspnea. Troponin is elevated. Agree that cardiac cath is indicated. Will plan cardiac cath today. Continue IV heparin. All questions answered.    Verne Carrow, MD, [facility] 04/25/2023 10:15 AM

## 2023-04-25 NOTE — Plan of Care (Signed)
  Problem: Education: Goal: Understanding of cardiac disease, CV risk reduction, and recovery process will improve Outcome: Progressing Goal: Individualized Educational Video(s) Outcome: Progressing   Problem: Activity: Goal: Ability to tolerate increased activity will improve Outcome: Progressing   Problem: Cardiac: Goal: Ability to achieve and maintain adequate cardiovascular perfusion will improve Outcome: Progressing   Problem: Health Behavior/Discharge Planning: Goal: Ability to safely manage health-related needs after discharge will improve Outcome: Progressing   Problem: Education: Goal: Knowledge of General Education information will improve Description: Including pain rating scale, medication(s)/side effects and non-pharmacologic comfort measures Outcome: Progressing   Problem: Health Behavior/Discharge Planning: Goal: Ability to manage health-related needs will improve Outcome: Progressing   Problem: Clinical Measurements: Goal: Ability to maintain clinical measurements within normal limits will improve Outcome: Progressing Goal: Will remain free from infection Outcome: Progressing Goal: Diagnostic test results will improve Outcome: Progressing Goal: Respiratory complications will improve Outcome: Progressing Goal: Cardiovascular complication will be avoided Outcome: Progressing   Problem: Activity: Goal: Risk for activity intolerance will decrease Outcome: Progressing   Problem: Nutrition: Goal: Adequate nutrition will be maintained Outcome: Progressing   Problem: Coping: Goal: Level of anxiety will decrease Outcome: Progressing   Problem: Elimination: Goal: Will not experience complications related to bowel motility Outcome: Progressing Goal: Will not experience complications related to urinary retention Outcome: Progressing   Problem: Pain Managment: Goal: General experience of comfort will improve and/or be controlled Outcome: Progressing    Problem: Safety: Goal: Ability to remain free from injury will improve Outcome: Progressing   Problem: Skin Integrity: Goal: Risk for impaired skin integrity will decrease Outcome: Progressing   Problem: Education: Goal: Understanding of CV disease, CV risk reduction, and recovery process will improve Outcome: Progressing Goal: Individualized Educational Video(s) Outcome: Progressing   Problem: Activity: Goal: Ability to return to baseline activity level will improve Outcome: Progressing   Problem: Cardiovascular: Goal: Ability to achieve and maintain adequate cardiovascular perfusion will improve Outcome: Progressing Goal: Vascular access site(s) Level 0-1 will be maintained Outcome: Progressing   Problem: Health Behavior/Discharge Planning: Goal: Ability to safely manage health-related needs after discharge will improve Outcome: Progressing   Problem: Education: Goal: Understanding of CV disease, CV risk reduction, and recovery process will improve Outcome: Progressing Goal: Individualized Educational Video(s) Outcome: Progressing   Problem: Activity: Goal: Ability to return to baseline activity level will improve Outcome: Progressing   Problem: Cardiovascular: Goal: Ability to achieve and maintain adequate cardiovascular perfusion will improve Outcome: Progressing Goal: Vascular access site(s) Level 0-1 will be maintained Outcome: Progressing   Problem: Health Behavior/Discharge Planning: Goal: Ability to safely manage health-related needs after discharge will improve Outcome: Progressing

## 2023-04-25 NOTE — Progress Notes (Signed)
PHARMACY - ANTICOAGULATION CONSULT NOTE  Pharmacy Consult for Heparin  Indication: chest pain/ACS  Allergies  Allergen Reactions   Bee Venom Anaphylaxis   Vicodin [Hydrocodone-Acetaminophen] Itching   Patient Measurements: Height: 5\' 2"  (157.5 cm) Weight: 93.4 kg (206 lb) IBW/kg (Calculated) : 50.1  Vital Signs: Temp: 98.1 F (36.7 C) (02/12 0635) Temp Source: Oral (02/12 0635) BP: 112/93 (02/12 0845) Pulse Rate: 56 (02/12 0845)  Labs: Recent Labs    04/24/23 2041 04/24/23 2224 04/25/23 0413  HGB 13.6  --  13.2  HCT 41.9  --  40.8  PLT 244  --  242  CREATININE 1.31*  --  1.08*  TROPONINIHS 30* 127* 293*    Estimated Creatinine Clearance: 50.8 mL/min (A) (by C-G formula based on SCr of 1.08 mg/dL (H)).   Medical History: Past Medical History:  Diagnosis Date   Anxiety state, unspecified 09/20/2007   Arthritis    Cataract    Chronic kidney disease    kidney stones   HYPOTHYROIDISM 03/08/2007   no meds needed now 04-15-19   Pancreatitis    secondary to ERCP    Assessment: Jaime Fuller is a 73 y.o. year old female admitted on 04/24/2023 with concern for NSTEMI. No anticoagulation prior to admission. Pharmacy consulted to dose heparin.  Heparin level: 0.39, therapeutic  Heparin running at 850 units/hr. No overt s/sx bleeding noted.   Goal of Therapy:  Heparin level 0.3-0.7 units/ml Monitor platelets by anticoagulation protocol: Yes   Plan:  Continue heparin infusion at 850 units/hr Daily heparin level, CBC, and monitoring for bleeding F/u plans for anticoagulation and cards recs  Thank you for allowing pharmacy to participate in this patient's care.  Marja Kays, PharmD Emergency Medicine Clinical Pharmacist 04/25/2023,12:11 PM

## 2023-04-26 DIAGNOSIS — R7989 Other specified abnormal findings of blood chemistry: Secondary | ICD-10-CM

## 2023-04-26 DIAGNOSIS — I472 Ventricular tachycardia, unspecified: Secondary | ICD-10-CM

## 2023-04-26 DIAGNOSIS — I493 Ventricular premature depolarization: Secondary | ICD-10-CM | POA: Diagnosis not present

## 2023-04-26 LAB — CBC
HCT: 43.4 % (ref 36.0–46.0)
Hemoglobin: 13.7 g/dL (ref 12.0–15.0)
MCH: 28.3 pg (ref 26.0–34.0)
MCHC: 31.6 g/dL (ref 30.0–36.0)
MCV: 89.7 fL (ref 80.0–100.0)
Platelets: 187 10*3/uL (ref 150–400)
RBC: 4.84 MIL/uL (ref 3.87–5.11)
RDW: 13.6 % (ref 11.5–15.5)
WBC: 6.2 10*3/uL (ref 4.0–10.5)
nRBC: 0 % (ref 0.0–0.2)

## 2023-04-26 LAB — BASIC METABOLIC PANEL
Anion gap: 13 (ref 5–15)
BUN: 18 mg/dL (ref 8–23)
CO2: 18 mmol/L — ABNORMAL LOW (ref 22–32)
Calcium: 8.7 mg/dL — ABNORMAL LOW (ref 8.9–10.3)
Chloride: 105 mmol/L (ref 98–111)
Creatinine, Ser: 1.08 mg/dL — ABNORMAL HIGH (ref 0.44–1.00)
GFR, Estimated: 55 mL/min — ABNORMAL LOW (ref >60)
Glucose, Bld: 102 mg/dL — ABNORMAL HIGH (ref 70–99)
Potassium: 4.4 mmol/L (ref 3.5–5.1)
Sodium: 136 mmol/L (ref 135–145)

## 2023-04-26 LAB — SARS CORONAVIRUS 2 BY RT PCR: SARS Coronavirus 2 by RT PCR: POSITIVE — AB

## 2023-04-26 LAB — RESPIRATORY PANEL BY PCR

## 2023-04-26 LAB — LIPOPROTEIN A (LPA): Lipoprotein (a): 19.6 nmol/L (ref <75.0)

## 2023-04-26 LAB — MRSA NEXT GEN BY PCR, NASAL: MRSA by PCR Next Gen: NOT DETECTED

## 2023-04-26 LAB — SEDIMENTATION RATE: Sed Rate: 12 mm/h (ref 0–22)

## 2023-04-26 LAB — C-REACTIVE PROTEIN: CRP: <0.5 mg/dL (ref <1.0)

## 2023-04-26 LAB — MAGNESIUM: Magnesium: 2.1 mg/dL (ref 1.7–2.4)

## 2023-04-26 MED ORDER — LIDOCAINE IN D5W 4-5 MG/ML-% IV SOLN
1.0000 mg/min | INTRAVENOUS | Status: DC
  Administered 2023-04-26: 1 mg/min via INTRAVENOUS
  Filled 2023-04-26: qty 500

## 2023-04-26 MED ORDER — ISOPROTERENOL HCL 0.2 MG/ML IJ SOLN
1.0000 ug/min | INTRAVENOUS | Status: DC
  Administered 2023-04-26 – 2023-04-28 (×3): 1 ug/min via INTRAVENOUS
  Filled 2023-04-26 (×5): qty 5

## 2023-04-26 MED ORDER — LIDOCAINE IN D5W 4-5 MG/ML-% IV SOLN: 1.0000 mg/min | INTRAVENOUS | Status: DC

## 2023-04-26 MED ORDER — ORAL CARE MOUTH RINSE: 15.0000 mL | OROMUCOSAL | Status: DC | PRN

## 2023-04-26 MED ORDER — CALCIUM CARBONATE ANTACID 500 MG PO CHEW: 2.0000 | CHEWABLE_TABLET | Freq: Two times a day (BID) | ORAL | Status: DC | PRN

## 2023-04-26 MED ORDER — BENZONATATE 100 MG PO CAPS
100.0000 mg | ORAL_CAPSULE | Freq: Three times a day (TID) | ORAL | Status: DC | PRN
  Administered 2023-04-26 – 2023-04-30 (×10): 100 mg via ORAL
  Filled 2023-04-26 (×10): qty 1

## 2023-04-26 MED ORDER — LIDOCAINE BOLUS VIA INFUSION
100.0000 mg | Freq: Once | INTRAVENOUS | Status: AC
  Administered 2023-04-26: 100 mg via INTRAVENOUS
  Filled 2023-04-26: qty 100

## 2023-04-26 MED ORDER — CHLORHEXIDINE GLUCONATE CLOTH 2 % EX PADS
6.0000 | MEDICATED_PAD | Freq: Every day | CUTANEOUS | Status: DC
  Administered 2023-04-26 – 2023-04-30 (×5): 6 via TOPICAL

## 2023-04-26 NOTE — Consult Note (Addendum)
Cardiology Consultation   Patient ID: Jaime Fuller MRN: [id number]; DOB: [birth year]  Admit date: 04/24/2023 Date of Consult: 04/26/2023  PCP:  Philip Aspen, Limmie Patricia, MD   [facility] Providers Cardiologist:  Verne Carrow, MD   Patient Profile:   Jaime Fuller is a 73 y.o. female with a hx of hypothyroid, PVCs, GERD, CKD (IIIb) who is being seen 04/26/2023 for the evaluation of PVCs, NSVT at the request of Dr. Clifton James.  History of Present Illness:   Jaime Fuller reports known hx of PVCs that have never bothered her, wore a monitor in 2022 with <1% burden, TTE w/LVEF 60-65%, no WMA  Has been well though about 5 months ago or so started to feel mor fatigued, unusually tired all of the time, walking to her daughters house "right next door" she says about 65 feet and by the time there, very winded, this has gotten progressively worse.  She was admitted yesterday was at her PMD or urgent visit for him with symptoms of illness/COVID apparently when they asked her about symptoms, mentioned she had been SOB, took her vitals, and found bradycardic >> led to an EKG > abnormal w/V bigeminy and referred her to the ER (She reports she tested negative for covid)  She was here again found in V bigeminy and various reports of CP (pt denies), Trops low but rising and admitted to cardiology team, given ASA and s/l NTG with reported resolution of symptoms, started on lopressor, hep gtt  [facility] 04/25/23 Widely patent coronary arteries with no obstructive CAD 2.  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 She does feel palpitations, typically reports  unaware of her PVC She denies feeling lightheaded, weak Denies any hc of near syncope or syncope  LABS ordered stat 04/25/23 K+ 3.9 Mag 2.2 BUN/Creat 18/1.08 WBC 6.6 and 5 H/H 13.6/43 Plts 177  HS Trop on admission 30 > 127 > 293  TSH 4.826  Past Medical History:  Diagnosis Date   Anxiety state, unspecified 09/20/2007   Arthritis    Cataract    Chronic kidney disease    kidney stones   HYPOTHYROIDISM 03/08/2007   no meds needed now 04-15-19   Pancreatitis    secondary to ERCP    Past Surgical History:  Procedure Laterality Date   abdominal plasty     Carpel tunnel surgery left hand     Childbirth x 2     CHOLECYSTECTOMY     GASTRIC BYPASS     LEFT HEART CATH AND CORONARY ANGIOGRAPHY N/A 04/25/2023   Procedure: LEFT HEART CATH AND CORONARY ANGIOGRAPHY;  Surgeon: Tonny Bollman, MD;  Location: [facility] INVASIVE CV LAB;  Service: Cardiovascular;  Laterality: N/A;   UPPER GASTROINTESTINAL ENDOSCOPY     WISDOM TOOTH EXTRACTION       Home Medications:  Prior to Admission medications   Medication Sig Start Date End Date Taking? Authorizing Provider  cyanocobalamin 1000 MCG tablet Take 1,000 mcg by mouth daily.   Yes [provider]  EPINEPHrine 0.3 mg/0.3 mL IJ SOAJ injection Inject 0.3 mLs (0.3 mg total) into the muscle as needed for anaphylaxis. 04/03/19  Yes  Philip Aspen, Limmie Patricia, MD  levothyroxine (SYNTHROID) 50 MCG tablet TAKE 1 TABLET DAILY Patient taking differently: Take 50 mcg by mouth at bedtime. 04/06/22  Yes Philip Aspen, Limmie Patricia, MD  methocarbamol (ROBAXIN) 500 MG tablet Take 500 mg by mouth every 6 (six) hours as needed for muscle spasms. 03/16/23  Yes [provider]  PERCOCET 5-325 MG tablet Take 1 tablet by mouth every 6 (six) hours as needed for moderate pain (pain score 4-6). 03/16/23  Yes [provider]  valACYclovir (VALTREX) 1000 MG tablet ONE TABLET TWICE A DAY X 5 DAYS AS NEEDED FOR OUTBREAK 05/27/19  Yes Philip Aspen,  Limmie Patricia, MD  traMADol (ULTRAM) 50 MG tablet Take 50 mg by mouth as needed. Patient not taking: Reported on 04/25/2023    [provider]    Inpatient Medications: Scheduled Meds:  aspirin EC  81 mg Oral Daily   atorvastatin  80 mg Oral Daily   enoxaparin (LOVENOX) injection  40 mg Subcutaneous Q24H   levothyroxine  50 mcg Oral Daily   pantoprazole  40 mg Oral Daily   sodium chloride flush  3 mL Intravenous Q12H   Continuous Infusions:  sodium chloride     isoproterenol (ISUPREL) 1 mg in dextrose 5 % 250 mL (0.004 mg/mL) infusion     PRN Meds: sodium chloride, HYDROcodone-acetaminophen, nitroGLYCERIN, sodium chloride flush  Allergies:    Allergies  Allergen Reactions   Bee Venom Anaphylaxis   Vicodin [Hydrocodone-Acetaminophen] Itching    Social History:   Social History   Socioeconomic History   Marital status: Married    Spouse name: Not on file   Number of children: Not on file   Years of education: Not on file   Highest education level: Not on file  Occupational History   Not on file  Tobacco Use   Smoking status: Former    Passive exposure: Past   Smokeless tobacco: Never  Vaping Use   Vaping status: Never Used  Substance and Sexual Activity   Alcohol use: No    Alcohol/week: 0.0 standard drinks of alcohol   Drug use: No   Sexual activity: Not Currently  Other Topics Concern   Not on file  Social History Narrative   Not on file   Social Drivers of Health   Financial Resource Strain: Not on file  Food Insecurity: No Food Insecurity (04/25/2023)   Hunger Vital Sign    Worried About Running Out of Food in the Last Year: Never true    Ran Out of Food in the Last Year: Never true  Transportation Needs: No Transportation Needs (04/25/2023)   PRAPARE - Administrator, Civil Service (Medical): No    Lack of Transportation (Non-Medical): No  Physical Activity: Not on file  Stress: Not on file  Social Connections: Moderately Isolated  (04/25/2023)   Social Connection and Isolation Panel [NHANES]    Frequency of Communication with Friends and Family: More than three times a week    Frequency of Social Gatherings with Friends and Family: Twice a week    Attends Religious Services: Never    Database administrator or Organizations: No    Attends Banker Meetings: Never    Marital Status: Married  Catering manager Violence: Not At Risk (04/25/2023)   Humiliation, Afraid, Rape, and Kick questionnaire    Fear of Current or Ex-Partner: No    Emotionally Abused: No    Physically Abused: No    Sexually Abused:  No    Family History:   Family History  Problem Relation Age of Onset   Thyroid disease Mother    Lung cancer Father    Heart attack Father        Died at 44   Colon cancer Neg Hx    Esophageal cancer Neg Hx    Rectal cancer Neg Hx    Stomach cancer Neg Hx      ROS:  Please see the history of present illness.  All other ROS reviewed and negative.     Physical Exam/Data:   Vitals:   04/25/23 1919 04/25/23 2356 04/26/23 0429 04/26/23 0700  BP: (!) 93/53 (!) 99/52 (!) 96/50 (!) 95/58  Pulse: 62 63 64 (!) 44  Resp: 17 18 18 16   Temp: 98.2 F (36.8 C) 98.7 F (37.1 C) 98.3 F (36.8 C) (!) 97.4 F (36.3 C)  TempSrc: Oral Oral Oral Oral  SpO2: 98% 97% 96% 97%  Weight:   91.2 kg   Height:        Intake/Output Summary (Last 24 hours) at 04/26/2023 0847 Last data filed at 04/25/2023 1848 Gross per 24 hour  Intake 120 ml  Output --  Net 120 ml      04/26/2023    4:29 AM 04/24/2023    8:27 PM 07/26/2021    2:42 PM  Last 3 Weights  Weight (lbs) 201 lb 1 oz 206 lb 192 lb  Weight (kg) 91.2 kg 93.441 kg 87.091 kg     Body mass index is 36.77 kg/m.  General:  Well nourished, well developed, in no acute distress HEENT: normal Neck: no JVD Vascular: No carotid bruits; Distal pulses 2+ bilaterally Cardiac:  regularly irregular no murmurs, gallops or rubs Lungs:  CTA b/l, no wheezing, rhonchi  or rales  Abd: soft, nontender Ext: no edema Musculoskeletal:  No deformitiesl Skin: warm and dry  Neuro:  no focal abnormalities noted Psych:  Normal affect   EKG:  The EKG was personally reviewed and demonstrates:    SR, V bigeminy 86bpm, QTc read 447 SR V bigeminy, QTc 441 SR 57bpm, no ectopy QTc 512 SR 57bpm, one PVC, QTc Today Looks like a junctional underlying with IVCD, PVCs appear a different morphology, with NSVT (4 beats), qt difficult, visually perhaps longer  Telemetry:  Telemetry was personally reviewed and demonstrates:    SR, persistent V bigeminy with frequent NSVTs, some appear more polymorphic, 4-7 beats  Relevant CV Studies:  04/25/23: TTE 1. Left ventricular ejection fraction, by estimation, is 50 to 55%. The  left ventricle has low normal function. The left ventricle demonstrates  regional wall motion abnormalities (see scoring diagram/findings for  description). Left ventricular diastolic   parameters are consistent with Grade I diastolic dysfunction (impaired  relaxation). There is moderate hypokinesis of the left ventricular,  basal-mid inferoseptal wall and inferior wall.   2. Right ventricular systolic function is normal. The right ventricular  size is normal.   3. The mitral valve is grossly normal. Trivial mitral valve  regurgitation.   4. Tricuspid valve regurgitation is mild to moderate.   5. The aortic valve is tricuspid. Aortic valve regurgitation is not  visualized. Aortic valve sclerosis/calcification is present, without any  evidence of aortic stenosis.  Comparison(s): Changes from prior study are noted. 10/13/2020: LVEF 60-65%.    10/13/20: TTE 1. Left ventricular ejection fraction, by estimation, is 60 to 65%. The  left ventricle has normal function. The left ventricle has no regional  wall motion abnormalities. Left ventricular diastolic parameters were  normal.   2. Right ventricular systolic function is normal. The right  ventricular  size is normal. There is normal pulmonary artery systolic pressure.   3. The mitral valve is normal in structure. Trivial mitral valve  regurgitation. No evidence of mitral stenosis.   4. The aortic valve is normal in structure. Aortic valve regurgitation is  not visualized. No aortic stenosis is present.   5. The inferior vena cava is normal in size with greater than 50%  respiratory variability, suggesting right atrial pressure of 3 mmHg.     July 2022 Patient had a min HR of 47 bpm, max HR of 133 bpm, and avg HR of 66 bpm. Predominant underlying rhythm was Sinus Rhythm. Isolated SVEs were rare (<1.0%), SVE Couplets were rare (<1.0%), and no SVE Triplets were present. Isolated VEs were rare (<1.0%), VE  Couplets were rare (<1.0%), and no VE Triplets were present.     Laboratory Data:  High Sensitivity Troponin:   Recent Labs  Lab 04/24/23 2041 04/24/23 2224 04/25/23 0413  TROPONINIHS 30* 127* 293*     Chemistry Recent Labs  Lab 04/24/23 2041 04/25/23 0413 04/25/23 1844  NA 139 140  --   K 4.5 3.9  --   CL 106 101  --   CO2 22 22  --   GLUCOSE 105* 102*  --   BUN 21 18  --   CREATININE 1.31* 1.08* 1.25*  CALCIUM 8.8* 9.3  --   MG 2.4 2.2  --   GFRNONAA 44* 55* 46*  ANIONGAP 11 17*  --     Recent Labs  Lab 04/24/23 2224  PROT 6.7  ALBUMIN 3.8  AST 28  ALT 29  ALKPHOS 61  BILITOT 0.6   Lipids  Recent Labs  Lab 04/25/23 0413  CHOL 120  TRIG 23  HDL 56  LDLCALC 59  CHOLHDL 2.1    Hematology Recent Labs  Lab 04/24/23 2041 04/25/23 0413 04/25/23 1844  WBC 6.7 6.6 5.0  RBC 4.74 4.63 4.78  HGB 13.6 13.2 13.6  HCT 41.9 40.8 43.5  MCV 88.4 88.1 91.0  MCH 28.7 28.5 28.5  MCHC 32.5 32.4 31.3  RDW 13.5 13.8 13.5  PLT 244 242 177   Thyroid  Recent Labs  Lab 04/24/23 2224  TSH 4.826*  5.204*  FREET4 1.11    BNP Recent Labs  Lab 04/24/23 2041  BNP 248.1*    DDimer No results for input(s): "DDIMER" in the last 168  hours.   Radiology/Studies:   Chest 2 View Result Date: 04/24/2023 CLINICAL DATA:  Chest pain EXAM: CHEST - 2 VIEW COMPARISON:  01/07/2016 FINDINGS: No consolidation or pleural effusion. Mild lower lung bronchitic changes. Normal cardiac size. No pneumothorax. Postsurgical changes at the epigastrium with some air distended bowel in the left upper quadrant IMPRESSION: No active cardiopulmonary disease. Mild lower lung bronchitic changes. Air distended bowel in the left upper quadrant Electronically Signed   By: Jasmine Pang M.D.   On: 04/24/2023 21:08     Assessment and Plan:   PVCs NSVTs Known hx PVCs, though low burden by monitor 2022 1% Persistent bigeminy here Now with recurrent flurries of NSVTs Appears some degree of junctional rhythm intermittently As well as waxing/waning QT lengthening Stat labs ordered (yesterday looked OK) D/w via telephone with Dr. Lalla Brothers, who reviewed EKGs, cath Will 1st try isuprel, low dose 75mcg/min  3. Normal cors 4. LV gram noted: focal akinesis  of the mid inferior wall with appearance of a focal inferior wall aneurysm (new from 2022)     LVEF 50-55%  She is stable currently BP 90's/50's MAP 70s Denies symptoms outside of flutter inher heart beat  EP MD will see once out of procedure I have discussed with ICU AHF MD, Dr. Shirlee Latch  ADDEND: Dr. Jimmey Ralph reviewed, has seen the patient She has subsequently been found COVID + Perhaps miocarditis Added lidocaine gtt BP has been w/ MAPs 58-60's Pt feeling well    Risk Assessment/Risk Scores:     For questions or updates, please contact [facility] Please consult [email or web address] for contact info under    Signed, Sheilah Pigeon, PA-C  04/26/2023 8:47 AM

## 2023-04-26 NOTE — Progress Notes (Signed)
Received call from telemetry monitoring stating patient had some arrythmias happening, reviewed the telemtry and noticed patient had some runs of Bigemny, PVC's and Vtach very frequently.  Notfied MD and PA. Obtained EKG and VS to confirm.  Patient alert and orientated x4 no reports of feeling heart race, dyspnea or chest pain.  Order received to transfer patient to higher level of care, report called and patient transported to new room by th is  nurse and Pensions consultant

## 2023-04-26 NOTE — Consult Note (Addendum)
Advanced Heart Failure Team Consult Note   Primary Physician: Philip Aspen, Limmie Patricia, MD Cardiologist:  Verne Carrow, MD  Reason for Consultation: Cardiomyopathy   HPI:    Jaime Fuller is seen today for evaluation of cardiomyopathy in setting of frequent PVCs/NSVT at the request of Dr. Clifton James with [facility]. 73 y.o. female with history of hypothyroidism, obesity s/p Roux-en-Y, anxiety, hx PVCs, nonobstructive CAD on CTA 2022, CKD IIIb. History obtained on chart review and discussion with patient and daughter.  Has been experiencing progressive shortness of breath with exertion, fatigue and chest pain described as indigestion like symptoms for 5 months. Has been treated for acid reflux without relief of symptoms. Went to urgent care for her husband to be evaluated for COVID 02/11. Husband positive for COVID, she was negative. She was asked about he own well being by staff. Vitals obtained and was found to be bradycardic. She ECG which showed SR with PVCs in bigeminy pattern. Sent to ED for further workup. Pain relieved with SL nitroglycerin. Received IV lasix for folume overload. Admitted for chest pain workup and acute CHF.  Cardiac cath did not show any obstructive CAD. On LV gram EF appeared 50-55% with focal AK of mid inferior wall with appearance of inferior aneurysm  Echo w/ EF 50-55%, HK basal inferoseptal segment, grade I DD, RV okay  Cardiology called to bedside this am to evaluation patient after noted sinus brady with frequent PVCs and short runs of NSVT. She was not started on amiodarone d/t long QT. Metoprolol (new this admit) was discontinued. Zoloft has been held. Case discussed with EP and she was transferred to the ICU for close monitoring.  She denies any family history of premature SCD or cardiomyopathy.   She does not drink any alcohol or use drugs.  Home Medications Prior to Admission medications   Medication Sig Start Date End Date Taking?  Authorizing Provider  cyanocobalamin 1000 MCG tablet Take 1,000 mcg by mouth daily.   Yes [provider]  EPINEPHrine 0.3 mg/0.3 mL IJ SOAJ injection Inject 0.3 mLs (0.3 mg total) into the muscle as needed for anaphylaxis. 04/03/19  Yes Philip Aspen, Limmie Patricia, MD  levothyroxine (SYNTHROID) 50 MCG tablet TAKE 1 TABLET DAILY Patient taking differently: Take 50 mcg by mouth at bedtime. 04/06/22  Yes Philip Aspen, Limmie Patricia, MD  methocarbamol (ROBAXIN) 500 MG tablet Take 500 mg by mouth every 6 (six) hours as needed for muscle spasms. 03/16/23  Yes [provider]  PERCOCET 5-325 MG tablet Take 1 tablet by mouth every 6 (six) hours as needed for moderate pain (pain score 4-6). 03/16/23  Yes [provider]  valACYclovir (VALTREX) 1000 MG tablet ONE TABLET TWICE A DAY X 5 DAYS AS NEEDED FOR OUTBREAK 05/27/19  Yes Philip Aspen, Limmie Patricia, MD  traMADol (ULTRAM) 50 MG tablet Take 50 mg by mouth as needed. Patient not taking: Reported on 04/25/2023    [provider]    Past Medical History: Past Medical History:  Diagnosis Date   Anxiety state, unspecified 09/20/2007   Arthritis    Cataract    Chronic kidney disease    kidney stones   HYPOTHYROIDISM 03/08/2007   no meds needed now 04-15-19   Pancreatitis    secondary to ERCP    Past Surgical History: Past Surgical History:  Procedure Laterality Date   abdominal plasty     Carpel tunnel surgery left hand     Childbirth x 2  CHOLECYSTECTOMY     GASTRIC BYPASS     LEFT HEART CATH AND CORONARY ANGIOGRAPHY N/A 04/25/2023   Procedure: LEFT HEART CATH AND CORONARY ANGIOGRAPHY;  Surgeon: Tonny Bollman, MD;  Location: [facility] INVASIVE CV LAB;  Service: Cardiovascular;  Laterality: N/A;   UPPER GASTROINTESTINAL ENDOSCOPY     WISDOM TOOTH EXTRACTION      Family History: Family History  Problem Relation Age of Onset   Thyroid disease Mother    Lung cancer Father    Heart attack Father        Died at 85    Colon cancer Neg Hx    Esophageal cancer Neg Hx    Rectal cancer Neg Hx    Stomach cancer Neg Hx     Social History: Social History   Socioeconomic History   Marital status: Married    Spouse name: Not on file   Number of children: Not on file   Years of education: Not on file   Highest education level: Not on file  Occupational History   Not on file  Tobacco Use   Smoking status: Former    Passive exposure: Past   Smokeless tobacco: Never  Vaping Use   Vaping status: Never Used  Substance and Sexual Activity   Alcohol use: No    Alcohol/week: 0.0 standard drinks of alcohol   Drug use: No   Sexual activity: Not Currently  Other Topics Concern   Not on file  Social History Narrative   Not on file   Social Drivers of Health   Financial Resource Strain: Not on file  Food Insecurity: No Food Insecurity (04/25/2023)   Hunger Vital Sign    Worried About Running Out of Food in the Last Year: Never true    Ran Out of Food in the Last Year: Never true  Transportation Needs: No Transportation Needs (04/25/2023)   PRAPARE - Administrator, Civil Service (Medical): No    Lack of Transportation (Non-Medical): No  Physical Activity: Not on file  Stress: Not on file  Social Connections: Moderately Isolated (04/25/2023)   Social Connection and Isolation Panel [NHANES]    Frequency of Communication with Friends and Family: More than three times a week    Frequency of Social Gatherings with Friends and Family: Twice a week    Attends Religious Services: Never    Database administrator or Organizations: No    Attends Banker Meetings: Never    Marital Status: Married    Allergies:  Allergies  Allergen Reactions   Bee Venom Anaphylaxis   Vicodin [Hydrocodone-Acetaminophen] Itching    Objective:    Vital Signs:   Temp:  [97.4 F (36.3 C)-99 F (37.2 C)] 97.4 F (36.3 C) (02/13 0700) Pulse Rate:  [31-79] 59 (02/13 0900) Resp:  [12-31] 21 (02/13  0900) BP: (93-121)/(50-81) 99/76 (02/13 0900) SpO2:  [94 %-100 %] 95 % (02/13 0900) Weight:  [91.2 kg] 91.2 kg (02/13 0429) Last BM Date : 04/24/23  Weight change: Filed Weights   04/24/23 2027 04/26/23 0429  Weight: 93.4 kg 91.2 kg    Intake/Output:   Intake/Output Summary (Last 24 hours) at 04/26/2023 1610 Last data filed at 04/25/2023 1848 Gross per 24 hour  Intake 120 ml  Output --  Net 120 ml      Physical Exam    General:  Well appearing.  HEENT: normal Neck: JVP 5-6 Cor: Regular rate & rhythm. No rubs, gallops or murmurs. Lungs: clear Abdomen:  soft, nontender, nondistended.  Extremities: no cyanosis, clubbing, rash, edema Neuro: alert & orientedx3. Affect pleasant   Telemetry   SR/SB with PVCs in bigeminy pattern and very frequent short runs NSVT  EKG    Initial: SR 86 bpm, PVCs in bigeminy pattern  Labs   Basic Metabolic Panel: Recent Labs  Lab 04/24/23 2041 04/25/23 0413 04/25/23 1844  NA 139 140  --   K 4.5 3.9  --   CL 106 101  --   CO2 22 22  --   GLUCOSE 105* 102*  --   BUN 21 18  --   CREATININE 1.31* 1.08* 1.25*  CALCIUM 8.8* 9.3  --   MG 2.4 2.2  --     Liver Function Tests: Recent Labs  Lab 04/24/23 2224  AST 28  ALT 29  ALKPHOS 61  BILITOT 0.6  PROT 6.7  ALBUMIN 3.8   No results for input(s): "LIPASE", "AMYLASE" in the last 168 hours. No results for input(s): "AMMONIA" in the last 168 hours.  CBC: Recent Labs  Lab 04/24/23 2041 04/25/23 0413 04/25/23 1844  WBC 6.7 6.6 5.0  NEUTROABS  --  3.9  --   HGB 13.6 13.2 13.6  HCT 41.9 40.8 43.5  MCV 88.4 88.1 91.0  PLT 244 242 177    Cardiac Enzymes: No results for input(s): "CKTOTAL", "CKMB", "CKMBINDEX", "TROPONINI" in the last 168 hours.  BNP: BNP (last 3 results) Recent Labs    04/24/23 2041  BNP 248.1*    ProBNP (last 3 results) No results for input(s): "PROBNP" in the last 8760 hours.   CBG: No results for input(s): "GLUCAP" in the last 168  hours.  Coagulation Studies: No results for input(s): "LABPROT", "INR" in the last 72 hours.   Imaging   ECHOCARDIOGRAM COMPLETE Result Date: 04/25/2023    ECHOCARDIOGRAM REPORT   Patient Name:   KASHARI CHALMERS Date of Exam: 04/25/2023 Medical Rec #:  [id number]      Height:       62.0 in Accession #:    [id number]     Weight:       206.0 lb Date of Birth:  Jul 26, 1950       BSA:          1.936 m Patient Age:    72 years       BP:           105/63 mmHg Patient Gender: F              HR:           60 bpm. Exam Location:  Inpatient Procedure: 2D Echo, Color Doppler, Cardiac Doppler and Intracardiac            Opacification Agent (Both Spectral and Color Flow Doppler were            utilized during procedure). Indications:     Acute MI I21.9  History:         Patient has prior history of Echocardiogram examinations, most                  recent 10/13/2020. CAD.  Sonographer:     Harriette Bouillon RDCS Referring Phys:  [id number] Elmon Kirschner Diagnosing Phys: Zoila Shutter MD IMPRESSIONS  1. Left ventricular ejection fraction, by estimation, is 50 to 55%. The left ventricle has low normal function. The left ventricle demonstrates regional wall motion abnormalities (see scoring diagram/findings for description). Left ventricular diastolic  parameters are consistent with  Grade I diastolic dysfunction (impaired relaxation). There is moderate hypokinesis of the left ventricular, basal-mid inferoseptal wall and inferior wall.  2. Right ventricular systolic function is normal. The right ventricular size is normal.  3. The mitral valve is grossly normal. Trivial mitral valve regurgitation.  4. Tricuspid valve regurgitation is mild to moderate.  5. The aortic valve is tricuspid. Aortic valve regurgitation is not visualized. Aortic valve sclerosis/calcification is present, without any evidence of aortic stenosis. Comparison(s): Changes from prior study are noted. 10/13/2020: LVEF 60-65%. FINDINGS  Left Ventricle: Left ventricular  ejection fraction, by estimation, is 50 to 55%. The left ventricle has low normal function. The left ventricle demonstrates regional wall motion abnormalities. Moderate hypokinesis of the left ventricular, basal-mid inferoseptal wall and inferior wall. Definity contrast agent was given IV to delineate the left ventricular endocardial borders. Strain imaging was not performed. The left ventricular internal cavity size was normal in size. There is no left ventricular hypertrophy. Left ventricular diastolic parameters are consistent with Grade I diastolic dysfunction (impaired relaxation). Indeterminate filling pressures.  LV Wall Scoring: The basal inferoseptal segment is hypokinetic. Right Ventricle: The right ventricular size is normal. No increase in right ventricular wall thickness. Right ventricular systolic function is normal. Left Atrium: Left atrial size was normal in size. Right Atrium: Right atrial size was normal in size. Pericardium: There is no evidence of pericardial effusion. Mitral Valve: The mitral valve is grossly normal. Trivial mitral valve regurgitation. Tricuspid Valve: The tricuspid valve is grossly normal. Tricuspid valve regurgitation is mild to moderate. Aortic Valve: The aortic valve is tricuspid. Aortic valve regurgitation is not visualized. Aortic valve sclerosis/calcification is present, without any evidence of aortic stenosis. Pulmonic Valve: The pulmonic valve was normal in structure. Pulmonic valve regurgitation is not visualized. Aorta: The aortic root and ascending aorta are structurally normal, with no evidence of dilitation. IAS/Shunts: No atrial level shunt detected by color flow Doppler. Additional Comments: 3D imaging was not performed.  LEFT VENTRICLE PLAX 2D LVIDd:         4.90 cm      Diastology LVIDs:         3.90 cm      LV e' medial:    6.85 cm/s LV PW:         0.80 cm      LV E/e' medial:  10.4 LV IVS:        0.80 cm      LV e' lateral:   8.05 cm/s LVOT diam:     2.10 cm       LV E/e' lateral: 8.8 LV SV:         62 LV SV Index:   32 LVOT Area:     3.46 cm  LV Volumes (MOD) LV vol d, MOD A2C: 116.2 ml LV vol d, MOD A4C: 97.5 ml LV vol s, MOD A2C: 68.3 ml LV vol s, MOD A4C: 60.2 ml LV SV MOD A2C:     47.9 ml LV SV MOD A4C:     97.5 ml LV SV MOD BP:      58.5 ml RIGHT VENTRICLE RV S prime:     11.00 cm/s TAPSE (M-mode): 2.5 cm LEFT ATRIUM             Index        RIGHT ATRIUM           Index LA diam:        4.20 cm 2.17 cm/m   RA Area:  17.20 cm LA Vol (A2C):   63.9 ml 33.01 ml/m  RA Volume:   48.60 ml  25.11 ml/m LA Vol (A4C):   59.9 ml 30.94 ml/m LA Biplane Vol: 61.8 ml 31.92 ml/m  AORTIC VALVE LVOT Vmax:   80.30 cm/s LVOT Vmean:  52.500 cm/s LVOT VTI:    0.179 m  AORTA Ao Root diam: 2.90 cm Ao Asc diam:  3.00 cm MITRAL VALVE               TRICUSPID VALVE MV Area (PHT): 3.28 cm    TR Peak grad:   28.1 mmHg MV E velocity: 71.10 cm/s  TR Vmax:        265.00 cm/s MV A velocity: 94.30 cm/s MV E/A ratio:  0.75        SHUNTS                            Systemic VTI:  0.18 m                            Systemic Diam: 2.10 cm Zoila Shutter MD Electronically signed by Zoila Shutter MD Signature Date/Time: 04/25/2023/4:35:05 PM    Final (Updated)    CARDIAC CATHETERIZATION Result Date: 04/25/2023 Widely patent coronary arteries with no obstructive CAD 2.  Low normal LVEF of 50 to 55% with focal akinesis of the mid inferior wall with appearance of a focal inferior wall aneurysm     Medications:     Current Medications:  aspirin EC  81 mg Oral Daily   atorvastatin  80 mg Oral Daily   enoxaparin (LOVENOX) injection  40 mg Subcutaneous Q24H   levothyroxine  50 mcg Oral Daily   pantoprazole  40 mg Oral Daily   sodium chloride flush  3 mL Intravenous Q12H    Infusions:  sodium chloride     isoproterenol (ISUPREL) 1 mg in dextrose 5 % 250 mL (0.004 mg/mL) infusion        Patient Profile   74 y.o. female with history of obesity status post gastric bypass surgery, hx  PVCs (< 1% burden on 3 day monitor in 07/22), hypothyroidism, CKD III.  She presented with progressive dyspnea with exertion, chest pain and elevated troponin. No CAD on cath. Found to have very frequent PVCs and runs of NSVT.  Assessment/Plan   Chest pain/dyspnea Elevated troponin Cardiomyopathy -Very difficult historian but has reported worsening dyspnea with exertion and chest pain described as indigestion X 5 months -HS troponin 30>127>293 -Cath with no significant CAD. On LV gram EF 50-55%, focal AK mid inferior wall w/ appearance of inferior wall aneurysm -Echo: EF 50-55%, HK basal-mid inferoseptal and inferior wall, grade I DD, RV okay -Does not appear volume up. LVEDP 13 in cath lab -Suspect may need cMRI once rhythm more stable.  -Denies any recent viral illness. Husband + for COVID. She was recently negative. Has repeat COVID test ordered today  2. Frequent PVCs NSVT -Known hx PVCs but burden less than 1% on monitor in 2022 -Telemetry: SR/SB with frequent PVCs in bigeminy pattern and short runs NSVT -Beta blocker has been stopped (new this admit) -? If driven by a cardiomyopathy. See above -Not started on amiodarone given concern for long QT -EP has started isoproterenol -K 3.9, Mag 2.2 yesterday, recheck today  3. CKD III (a vs b) -Scr 1.1 today  4. Obesity - S/p gastric bypass surgery  5. Hypothyroidism - On synthroid - TSH 4.8, Free T4 WNL  Length of Stay: 2  FINCH, LINDSAY N, PA-C  04/26/2023, 9:18 AM  Advanced Heart Failure Team Pager [phone] (M-F; 7a - 5p)  Please contact [facility] for night-coverage after hours (4p -7a ) and weekends on [email or web address]   Patient seen with PA, I formulated the plan and agree with the above note.   Patient has prior history of rare PVCs.  She was noted at an urgent care to have bradycardia on pulse ox and sent to ER.  This likely corresponded to bigeminal PVCs.  She reports exertional dyspnea x 6 months and atypical  chest pain x 6 months.  She has felt palpitations for about 2 days though she denies worsening dyspnea or chest pain.  Her husband was found to be COVID-19+, and she has now tested + for COVID-19.   Initial HS-TnI was elevated at 293.  She was noted to have very frequent ventricular arrhythmias with short NSVT runs and frequent PVCs.   Echo was done, showing EF 50-55% with basal inferior and inferoseptal hypokinesis.  LHC showed normal coronaries.   General: NAD Neck: JVP 8 cm, no thyromegaly or thyroid nodule.  Lungs: Clear to auscultation bilaterally with normal respiratory effort. CV: Nondisplaced PMI.  Heart regular S1/S2, no S3/S4, no murmur.  No peripheral edema.  No carotid bruit.  Normal pedal pulses.  Abdomen: Soft, nontender, no hepatosplenomegaly, no distention.  Skin: Intact without lesions or rashes.  Neurologic: Alert and oriented x 3.  Psych: Normal affect. Extremities: No clubbing or cyanosis.  HEENT: Normal.   1. Ventricular arrhythmias: Patient noted to have frequent PVCs, bigeminy at times.  She has also had frequent short NSVT runs.  She has had intermittent junctional rhythm that may trigger NSVT by a bradycardia mechanism.  QT has been prolonged.  Trigger for ventricular arrhythmias is uncertain.  Cath this admission showed normal coronaries.  She is noted to have echo showing EF 50-55% with basal inferior and basal inferoseptal hypokinesis.  Her symptoms have been relatively prolonged (dyspnea and atypical chest pain), so cardiac sarcoidosis is a definite concern.  However, with recent onset of significant palpitations and COVID-19 diagnosis, COVID-related myocarditis is also a possibility.  - She has been started on isoproterenol at 1 mcg/min to keep HR up to prevent brady-mediated NSVT and to try to suppress PVCs.  - Starting lidocaine at 1 mg/min. Check level in am.  - She will need a cardiac MRI when her rhythm is more stable.  Hopefully tomorrow. Send ESR, CRP, ACE  level.  2. Hypothyroidism: Stable on Synthroid 3. CKD stage 3: Creatinine looks good at 1.08 today.   Marca Ancona 04/26/2023 2:53 PM

## 2023-04-26 NOTE — Plan of Care (Signed)
Problem: Education: Goal: Knowledge of General Education information will improve Description: Including pain rating scale, medication(s)/side effects and non-pharmacologic comfort measures Outcome: Progressing   Problem: Health Behavior/Discharge Planning: Goal: Ability to manage health-related needs will improve Outcome: Progressing   Problem: Clinical Measurements: Goal: Ability to maintain clinical measurements within normal limits will improve Outcome: Progressing Goal: Will remain free from infection Outcome: Progressing Goal: Respiratory complications will improve Outcome: Progressing   Problem: Nutrition: Goal: Adequate nutrition will be maintained Outcome: Progressing

## 2023-04-26 NOTE — TOC Initial Note (Signed)
Transition of Care [facility]) - Initial/Assessment Note    Patient Details  Name: Jaime Fuller MRN: [id number] Date of Birth: [birth year]  [facility]) CM/SW Contact:    Elliot Cousin, RN Phone Number: [phone] 04/26/2023, 5:36 PM  Clinical Narrative:                  Patient at home with husband. Was independent pta. Will continue to follow for dc needs   Expected Discharge Plan: Home w Home Health Services Barriers to Discharge: Continued Medical Work up   Patient Goals and CMS Choice            Expected Discharge Plan and Services   Discharge Planning Services: CM Consult   Living arrangements for the past 2 months: Single Family Home                                      Prior Living Arrangements/Services Living arrangements for the past 2 months: Single Family Home Lives with:: Spouse                   Activities of Daily Living   ADL Screening (condition at time of admission) Independently performs ADLs?: Yes (appropriate for developmental age) Is the patient deaf or have difficulty hearing?: No Does the patient have difficulty seeing, even when wearing glasses/contacts?: Yes Does the patient have difficulty concentrating, remembering, or making decisions?: No  Permission Sought/Granted                  Emotional Assessment              Admission diagnosis:  Elevated troponin [R79.89] NSTEMI (non-ST elevated myocardial infarction) (HCC) [I21.4] Nonspecific chest pain [R07.9] Patient Active Problem List   Diagnosis Date Noted   Elevated troponin 04/26/2023   Ventricular tachycardia (HCC) 04/26/2023   NSTEMI (non-ST elevated myocardial infarction) (HCC) 04/24/2023   Neck pain 05/18/2020   Multinodular goiter 05/18/2020   Pancreatitis    Vitamin D deficiency 04/03/2019   Iron deficiency anemia 04/03/2019   Routine general medical examination at a health care facility 01/29/2017   Pernicious anemia  09/21/2011   Hypothyroidism 03/08/2007   PCP:  Henderson Cloud, MD Pharmacy:   [facility] [phone] [place], [place] - [address] AT [address] [place] [address] Phone: [phone] Fax: [phone]  [facility] - [place], [place] - [address] [place] [address] Phone: [phone] Fax: [phone]     Social Drivers of Health (SDOH) Social History: SDOH Screenings   Food Insecurity: No Food Insecurity (04/25/2023)  Housing: Low Risk  (04/25/2023)  Transportation Needs: No Transportation Needs (04/25/2023)  Utilities: Not At Risk (04/25/2023)  Depression (PHQ2-9): Low Risk  (06/15/2021)  Social Connections: Moderately Isolated (04/25/2023)  Tobacco Use: Medium Risk (04/24/2023)   SDOH Interventions:     Readmission Risk Interventions     No data to display

## 2023-04-26 NOTE — Progress Notes (Addendum)
Patient Name: Jaime Fuller Date of Encounter: 04/26/2023 [facility] Cardiologist: Verne Carrow, MD   Interval Summary  .    Called by RN this morning with patient having episodes of NSVT. At the bedside patient denies any chest pain. HD stable with pressure in the 90s. Mostly feels fatigued.   Vital Signs .    Vitals:   04/25/23 1919 04/25/23 2356 04/26/23 0429 04/26/23 0700  BP: (!) 93/53 (!) 99/52 (!) 96/50 (!) 95/58  Pulse: 62 63 64 (!) 44  Resp: 17 18 18 16   Temp: 98.2 F (36.8 C) 98.7 F (37.1 C) 98.3 F (36.8 C) (!) 97.4 F (36.3 C)  TempSrc: Oral Oral Oral Oral  SpO2: 98% 97% 96% 97%  Weight:   91.2 kg   Height:        Intake/Output Summary (Last 24 hours) at 04/26/2023 0845 Last data filed at 04/25/2023 1848 Gross per 24 hour  Intake 120 ml  Output --  Net 120 ml      04/26/2023    4:29 AM 04/24/2023    8:27 PM 07/26/2021    2:42 PM  Last 3 Weights  Weight (lbs) 201 lb 1 oz 206 lb 192 lb  Weight (kg) 91.2 kg 93.441 kg 87.091 kg      Telemetry/ECG    Sinus Rhythm into sinus bradycardia with 3/4/5 beats of NSVT, one episode of polymorphic VT into sinus bradycardia, bigeminy - Personally Reviewed  Physical Exam .    GEN: No acute distress.   Neck: No JVD Cardiac: RRR, no murmurs, rubs, or gallops.  Respiratory: Clear to auscultation bilaterally. GI: Soft, nontender, non-distended  MS: No edema Skin: Right radial cath site stable  Assessment & Plan .     Chest pain NSTEMI   Dyspnea -- reported intermittent chest pain, DOE for the past couple of months. Also noted some fullness in her throat associated with chest heaviness. Thought this was GERD, but has tried several reflux meds without relief.  -- Presented to urgent care because husband had COVID-like symptoms and tested positive.  She tested negative and then developed chest tightness.  EKG at that time showed bigeminy and patient was advised to go to the ED -- hs TN 30 >127  >293  -- underwent cardiac cath without CAD, though does have focal akinesis of the mid inferior wall with focal aneurysm. -- echo showed LVEF of 50-55%, g1DD, basal-mid inferoseptal wall with hypokinesis    Frequent PVC Prolonged QTc NSVT Polymorphic VT? -- known hx of the same, EKG on admission showed sinus with bigeminy -- K+ 3.9, Mag 2.2 (2/12) -- initial EKG: NSR with bigemy PVC HR 68 -- review of telemetry shows freq episodes of NSVT, bigimeny and one episode concerning for polymorphic VT that breaks into sinus bradycardia with continued PVCs -- was placed on metoprolol 25mg  BID on admission (which is new). Will DC this morning. -- Zoloft was held on admission  -- discussed with EP, as her QT is prolonged therefore suspect will need to avoid amiodarone    HLD  -- LDL 59  -- on Lipitor 80mg     Possible Sleep Apnea  -- will plan for outpatient sleep study evaluation   Acute on CK3B -- Cr 1.31 >1.08>>1.25    As above, EP consulted. Orders to transfer to ICU, may need isuprel per brief EP discussion.   For questions or updates, please contact [facility] Please consult [email or web address] for contact info under  Signed, Laverda Page, NP   I have personally seen and examined this patient. I agree with the assessment and plan as outlined above.  Cardiac cath yesterday with minimal CAD. Echo with preserved LV function with inferior wall motion abnormality and possible aneurysmal segment on LV gram. Pt with frequent PVCs, runs of VT overnight. Will transfer to 2H. We have asked EP to see her to help with medical management of her VT. Given prolonged QT interval, will avoid amiodarone. She feels well this am but she does feel abnormal when she has VT.  My exam: NAD  CV:RRR Lungs :clear Ext: no edema Plan:  VT: transfer to 2H. EP to assist today in medical management.   Verne Carrow, MD,FACC 04/26/2023 9:06 AM

## 2023-04-27 ENCOUNTER — Inpatient Hospital Stay (HOSPITAL_COMMUNITY): Payer: Medicare Other

## 2023-04-27 DIAGNOSIS — I493 Ventricular premature depolarization: Secondary | ICD-10-CM | POA: Diagnosis not present

## 2023-04-27 DIAGNOSIS — I214 Non-ST elevation (NSTEMI) myocardial infarction: Secondary | ICD-10-CM

## 2023-04-27 DIAGNOSIS — I472 Ventricular tachycardia, unspecified: Secondary | ICD-10-CM | POA: Diagnosis not present

## 2023-04-27 LAB — BASIC METABOLIC PANEL
Anion gap: 13 (ref 5–15)
BUN: 20 mg/dL (ref 8–23)
CO2: 19 mmol/L — ABNORMAL LOW (ref 22–32)
Calcium: 8.5 mg/dL — ABNORMAL LOW (ref 8.9–10.3)
Chloride: 103 mmol/L (ref 98–111)
Creatinine, Ser: 1.06 mg/dL — ABNORMAL HIGH (ref 0.44–1.00)
GFR, Estimated: 56 mL/min — ABNORMAL LOW (ref >60)
Glucose, Bld: 107 mg/dL — ABNORMAL HIGH (ref 70–99)
Potassium: 3.9 mmol/L (ref 3.5–5.1)
Sodium: 135 mmol/L (ref 135–145)

## 2023-04-27 LAB — ANGIOTENSIN CONVERTING ENZYME: Angiotensin-Converting Enzyme: 65 U/L (ref 14–82)

## 2023-04-27 LAB — CBC
HCT: 39.5 % (ref 36.0–46.0)
Hemoglobin: 13 g/dL (ref 12.0–15.0)
MCH: 28.3 pg (ref 26.0–34.0)
MCHC: 32.9 g/dL (ref 30.0–36.0)
MCV: 86.1 fL (ref 80.0–100.0)
Platelets: 183 10*3/uL (ref 150–400)
RBC: 4.59 MIL/uL (ref 3.87–5.11)
RDW: 13.5 % (ref 11.5–15.5)
WBC: 5.2 10*3/uL (ref 4.0–10.5)
nRBC: 0 % (ref 0.0–0.2)

## 2023-04-27 LAB — LIDOCAINE LEVEL
Lidocaine Lvl: 1.6 ug/mL (ref 1.5–5.0)
Lidocaine Lvl: 1.9 ug/mL (ref 1.5–5.0)

## 2023-04-27 LAB — MAGNESIUM: Magnesium: 2.1 mg/dL (ref 1.7–2.4)

## 2023-04-27 MED ORDER — AMIODARONE HCL IN DEXTROSE 360-4.14 MG/200ML-% IV SOLN
60.0000 mg/h | INTRAVENOUS | Status: DC
  Administered 2023-04-27 (×2): 60 mg/h via INTRAVENOUS
  Filled 2023-04-27: qty 200

## 2023-04-27 MED ORDER — AMIODARONE HCL IN DEXTROSE 360-4.14 MG/200ML-% IV SOLN
30.0000 mg/h | INTRAVENOUS | Status: DC
  Administered 2023-04-28 – 2023-04-29 (×2): 30 mg/h via INTRAVENOUS
  Filled 2023-04-27 (×5): qty 200

## 2023-04-27 MED ORDER — POTASSIUM CHLORIDE CRYS ER 20 MEQ PO TBCR
20.0000 meq | EXTENDED_RELEASE_TABLET | Freq: Once | ORAL | Status: AC
  Administered 2023-04-27: 20 meq via ORAL
  Filled 2023-04-27: qty 1

## 2023-04-27 MED ORDER — AMIODARONE LOAD VIA INFUSION
150.0000 mg | Freq: Once | INTRAVENOUS | Status: AC
  Administered 2023-04-27: 150 mg via INTRAVENOUS
  Filled 2023-04-27: qty 83.34

## 2023-04-27 MED ORDER — VALACYCLOVIR HCL 500 MG PO TABS
1000.0000 mg | ORAL_TABLET | Freq: Two times a day (BID) | ORAL | Status: DC
  Administered 2023-04-27 – 2023-04-28 (×3): 1000 mg via ORAL
  Filled 2023-04-27 (×7): qty 2

## 2023-04-27 MED ORDER — GADOBUTROL 1 MMOL/ML IV SOLN
10.0000 mL | Freq: Once | INTRAVENOUS | Status: AC | PRN
  Administered 2023-04-27: 10 mL via INTRAVENOUS

## 2023-04-27 NOTE — Progress Notes (Signed)
   Rounding Note    Patient Name: Jaime Fuller Date of Encounter: 04/27/2023  [facility] Cardiologist: Verne Carrow, MD   Subjective   In ICU now. Ectopy has improved. Cough persistent.  Vital Signs    Vitals:   04/26/23 1900 04/26/23 2000 04/26/23 2100 04/26/23 2200  BP: (!) 93/46 (!) 100/46 (!) 106/50 (!) 93/37  Pulse: (!) 46 (!) 56 (!) 39 (!) 53  Resp: 16 (!) 26 16 18   Temp:  99.1 F (37.3 C)    TempSrc:  Oral    SpO2: 94% 94% 96% 91%  Weight:      Height:        Intake/Output Summary (Last 24 hours) at 04/27/2023 0936 Last data filed at 04/26/2023 2200 Gross per 24 hour  Intake 487.59 ml  Output 1175 ml  Net -687.41 ml      04/26/2023    4:29 AM 04/24/2023    8:27 PM 07/26/2021    2:42 PM  Last 3 Weights  Weight (lbs) 201 lb 1 oz 206 lb 192 lb  Weight (kg) 91.2 kg 93.441 kg 87.091 kg      Telemetry    Ectopy has reduced in frequency - Personally Reviewed  ECG    Personally Reviewed  Physical Exam   GEN: No acute distress.   Cardiac: RRR, no murmurs, rubs, or gallops. Irregular. No edema. Respiratory: Clear to auscultation bilaterally. Psych: Normal affect   Assessment & Plan     #Ventricular tachycardia #PVCs Improved although still present. Isuprel seems to have calmed things with an improved sinus rate. Ddx includes sarcoidosis and myocarditis at this point. cMR today. I have discussed with HF team. If cMR is not revealing, I'd like to arrange an inpatient cardiac PET scan. ? Monday. Continue IV lidocaine     Sheria Lang T. Lalla Brothers, MD, [facility], [facility] Cardiac Electrophysiology

## 2023-04-27 NOTE — Progress Notes (Signed)
Patient ID: Jaime Fuller, female   DOB: 06-20-1950, 73 y.o.   MRN: [id number]     Advanced Heart Failure Rounding Note  Cardiologist: Verne Carrow, MD  Chief Complaint: VT Subjective:    Patient has ongoing PVCs, bigeminy at times.  No further NSVT and no junctional bradycardia.  She remains on lidocaine at 1 and isoproterenol at 1.   Has come coughing. No dyspnea.    Objective:   Weight Range: 91.2 kg Body mass index is 36.77 kg/m.   Vital Signs:   Temp:  [99.1 F (37.3 C)] 99.1 F (37.3 C) (02/13 2000) Pulse Rate:  [33-70] 53 (02/13 2200) Resp:  [13-26] 18 (02/13 2200) BP: (80-111)/(34-66) 93/37 (02/13 2200) SpO2:  [89 %-99 %] 91 % (02/13 2200) Last BM Date : 04/26/23  Weight change: Filed Weights   04/24/23 2027 04/26/23 0429  Weight: 93.4 kg 91.2 kg    Intake/Output:   Intake/Output Summary (Last 24 hours) at 04/27/2023 0906 Last data filed at 04/26/2023 2200 Gross per 24 hour  Intake 487.59 ml  Output 1175 ml  Net -687.41 ml      Physical Exam    General:  Well appearing. No resp difficulty HEENT: Normal Neck: Supple. JVP 8 cm. Carotids 2+ bilat; no bruits. No lymphadenopathy or thyromegaly appreciated. Cor: PMI nondisplaced. Regular rate & rhythm. No rubs, gallops or murmurs. Lungs: Clear Abdomen: Soft, nontender, nondistended. No hepatosplenomegaly. No bruits or masses. Good bowel sounds. Extremities: No cyanosis, clubbing, rash, edema Neuro: Alert & orientedx3, cranial nerves grossly intact. moves all 4 extremities w/o difficulty. Affect pleasant   Telemetry   NSR with PVCs.  No NSVT or junctional bradycardia (personally reviewed).    Labs    CBC Recent Labs    04/25/23 0413 04/25/23 1844 04/26/23 0945 04/27/23 0304  WBC 6.6   < > 6.2 5.2  NEUTROABS 3.9  --   --   --   HGB 13.2   < > 13.7 13.0  HCT 40.8   < > 43.4 39.5  MCV 88.1   < > 89.7 86.1  PLT 242   < > 187 183   < > = values in this interval not displayed.   Basic  Metabolic Panel Recent Labs    95/28/41 0945 04/27/23 0304  NA 136 135  K 4.4 3.9  CL 105 103  CO2 18* 19*  GLUCOSE 102* 107*  BUN 18 20  CREATININE 1.08* 1.06*  CALCIUM 8.7* 8.5*  MG 2.1 2.1   Liver Function Tests Recent Labs    04/24/23 2224  AST 28  ALT 29  ALKPHOS 61  BILITOT 0.6  PROT 6.7  ALBUMIN 3.8   No results for input(s): "LIPASE", "AMYLASE" in the last 72 hours. Cardiac Enzymes No results for input(s): "CKTOTAL", "CKMB", "CKMBINDEX", "TROPONINI" in the last 72 hours.  BNP: BNP (last 3 results) Recent Labs    04/24/23 2041  BNP 248.1*    ProBNP (last 3 results) No results for input(s): "PROBNP" in the last 8760 hours.   D-Dimer No results for input(s): "DDIMER" in the last 72 hours. Hemoglobin A1C No results for input(s): "HGBA1C" in the last 72 hours. Fasting Lipid Panel Recent Labs    04/25/23 0413  CHOL 120  HDL 56  LDLCALC 59  TRIG 23  CHOLHDL 2.1   Thyroid Function Tests Recent Labs    04/24/23 2224  TSH 4.826*  5.204*    Other results:   Imaging    No results  found.   Medications:     Scheduled Medications:  Chlorhexidine Gluconate Cloth  6 each Topical Daily   levothyroxine  50 mcg Oral Daily   pantoprazole  40 mg Oral Daily   sodium chloride flush  3 mL Intravenous Q12H    Infusions:  isoproterenol (ISUPREL) 1 mg in dextrose 5 % 250 mL (0.004 mg/mL) infusion 2 mcg/min (04/27/23 0820)   lidocaine 1 mg/min (04/26/23 2200)    PRN Medications: benzonatate, calcium carbonate, HYDROcodone-acetaminophen, nitroGLYCERIN, mouth rinse, sodium chloride flush   Assessment/Plan   1. Ventricular arrhythmias: Patient noted to have frequent PVCs, bigeminy at times.  She has also had frequent short NSVT runs.  She has had intermittent junctional rhythm that may trigger NSVT by a bradycardia mechanism.  QT has been prolonged.  Trigger for ventricular arrhythmias is uncertain.  Cath this admission showed normal coronaries.   She is noted to have echo showing EF 50-55% with basal inferior and basal inferoseptal hypokinesis.  Her symptoms have been relatively prolonged (dyspnea and atypical chest pain), so cardiac sarcoidosis is a definite concern.  However, with recent onset of significant palpitations and COVID-19 diagnosis, COVID-related myocarditis is also a possibility.  ESR and CRP normal, ACE level normal.  She is now on lidocaine 1 and isoproterenol 1.  Overnight, no further NSVT or junctional bradycardia, still with frequent PVCs and bigeminy.  - On lidocaine at 1 mg/min, level pending.  Transition to mexiletine eventually, timing to be determined by EP.  - On isoproterenol, timing of discontinuation per EP.   - Cardiac MRI today to assess for sarcoidosis/other infiltrative disease or myocarditis (?COVID-related).  2. Hypothyroidism: Stable on Synthroid 3. CKD stage 3: Creatinine looks good at 1.08 today.   Length of Stay: 3  Marca Ancona, MD  04/27/2023, 9:06 AM  Advanced Heart Failure Team Pager [phone] (M-F; 7a - 5p)  Please contact [facility] for night-coverage after hours (5p -7a ) and weekends on [email or web address]

## 2023-04-27 NOTE — TOC Initial Note (Signed)
Transition of Care [facility]) - Initial/Assessment Note    Patient Details  Name: Jaime Fuller MRN: [id number] Date of Birth: 1950-08-24  Transition of Care [facility]) CM/SW Contact:    Elliot Cousin, RN Phone Number: [phone] 04/27/2023, 4:33 PM  Clinical Narrative:                 HF TOC CM spoke to pt and offered choice (medicare.gov  with ratings list provided and placed on chart.) Pt states her husband had [facility] in the past. Contacted [facility] rep, Kandee Keen with new referral. Pt states she has RW and cane at home. Husband and dtr at home to assist with care.  Will continue to follow for dc needs.    Expected Discharge Plan: Home w Home Health Services Barriers to Discharge: Continued Medical Work up   Patient Goals and CMS Choice   CMS Medicare.gov Compare Post Acute Care list provided to:: Patient Choice offered to / list presented to : Patient      Expected Discharge Plan and Services   Discharge Planning Services: CM Consult Post Acute Care Choice: Home Health Living arrangements for the past 2 months: Single Family Home                           HH Arranged: RN, PT [facility] Agency: [facility] Date [facility] Agency Contacted: 04/27/23 Time HH Agency Contacted: 1632 Representative spoke with at [facility] Agency: Lorenza Chick  Prior Living Arrangements/Services Living arrangements for the past 2 months: Single Family Home Lives with:: Spouse Patient language and need for interpreter reviewed:: Yes Do you feel safe going back to the place where you live?: Yes      Need for Family Participation in Patient Care: Yes (Comment) Care giver support system in place?: Yes (comment) Current home services: DME (rolling walker, cane) Criminal Activity/Legal Involvement Pertinent to Current Situation/Hospitalization: No - Comment as needed  Activities of Daily Living   ADL Screening (condition at time of admission) Independently performs ADLs?: Yes (appropriate for developmental  age) Is the patient deaf or have difficulty hearing?: No Does the patient have difficulty seeing, even when wearing glasses/contacts?: Yes Does the patient have difficulty concentrating, remembering, or making decisions?: No  Permission Sought/Granted Permission sought to share information with : Case Manager, PCP, Family Supports Permission granted to share information with : Yes, Verbal Permission Granted  Share Information with NAME: Kitt Minardi  Permission granted to share info w AGENCY: Home Health  Permission granted to share info w Relationship: husband  Permission granted to share info w Contact Information: [phone]  Emotional Assessment Appearance:: Appears stated age Attitude/Demeanor/Rapport: Engaged Affect (typically observed): Accepting Orientation: : Oriented to Self, Oriented to Place, Oriented to  Time, Oriented to Situation   Psych Involvement: No (comment)  Admission diagnosis:  Elevated troponin [R79.89] NSTEMI (non-ST elevated myocardial infarction) (HCC) [I21.4] Nonspecific chest pain [R07.9] Patient Active Problem List   Diagnosis Date Noted   Elevated troponin 04/26/2023   Ventricular tachycardia (HCC) 04/26/2023   NSTEMI (non-ST elevated myocardial infarction) (HCC) 04/24/2023   Neck pain 05/18/2020   Multinodular goiter 05/18/2020   Pancreatitis    Vitamin D deficiency 04/03/2019   Iron deficiency anemia 04/03/2019   Routine general medical examination at a health care facility 01/29/2017   Pernicious anemia 09/21/2011   Hypothyroidism 03/08/2007   PCP:  Philip Aspen, Limmie Patricia, MD Pharmacy:   [facility] [phone] [place] Otto,  -  [address] AT [address] [place] [address] Phone: [phone] Fax: [phone]  [facility] - [place], [place] - [address] [place] [address] Phone: [phone] Fax: [phone]     Social Drivers of Health  (SDOH) Social History: SDOH Screenings   Food Insecurity: No Food Insecurity (04/25/2023)  Housing: Low Risk  (04/25/2023)  Transportation Needs: No Transportation Needs (04/25/2023)  Utilities: Not At Risk (04/25/2023)  Depression (PHQ2-9): Low Risk  (06/15/2021)  Social Connections: Moderately Isolated (04/25/2023)  Tobacco Use: Medium Risk (04/24/2023)   SDOH Interventions:     Readmission Risk Interventions     No data to display

## 2023-04-28 DIAGNOSIS — I493 Ventricular premature depolarization: Secondary | ICD-10-CM | POA: Diagnosis not present

## 2023-04-28 DIAGNOSIS — I472 Ventricular tachycardia, unspecified: Secondary | ICD-10-CM | POA: Diagnosis not present

## 2023-04-28 LAB — BASIC METABOLIC PANEL
Anion gap: 10 (ref 5–15)
BUN: 13 mg/dL (ref 8–23)
CO2: 21 mmol/L — ABNORMAL LOW (ref 22–32)
Calcium: 8.5 mg/dL — ABNORMAL LOW (ref 8.9–10.3)
Chloride: 107 mmol/L (ref 98–111)
Creatinine, Ser: 1.05 mg/dL — ABNORMAL HIGH (ref 0.44–1.00)
GFR, Estimated: 57 mL/min — ABNORMAL LOW (ref >60)
Glucose, Bld: 109 mg/dL — ABNORMAL HIGH (ref 70–99)
Potassium: 3.8 mmol/L (ref 3.5–5.1)
Sodium: 138 mmol/L (ref 135–145)

## 2023-04-28 LAB — CBC
HCT: 40.8 % (ref 36.0–46.0)
Hemoglobin: 13.1 g/dL (ref 12.0–15.0)
MCH: 27.9 pg (ref 26.0–34.0)
MCHC: 32.1 g/dL (ref 30.0–36.0)
MCV: 87 fL (ref 80.0–100.0)
Platelets: 163 10*3/uL (ref 150–400)
RBC: 4.69 MIL/uL (ref 3.87–5.11)
RDW: 13.7 % (ref 11.5–15.5)
WBC: 5.1 10*3/uL (ref 4.0–10.5)
nRBC: 0 % (ref 0.0–0.2)

## 2023-04-28 MED ORDER — POTASSIUM CHLORIDE CRYS ER 20 MEQ PO TBCR
40.0000 meq | EXTENDED_RELEASE_TABLET | Freq: Once | ORAL | Status: AC
  Administered 2023-04-28: 40 meq via ORAL
  Filled 2023-04-28: qty 2

## 2023-04-28 NOTE — Progress Notes (Signed)
 Rounding Note    Patient Name: Jaime Fuller Date of Encounter: 04/28/2023  [facility] Cardiologist: Verne Carrow, MD   Subjective   Ectopy is improved.  Cough improved.  Has been switched to amiodarone.  Inpatient Medications    Scheduled Meds:  Chlorhexidine Gluconate Cloth  6 each Topical Daily   levothyroxine  50 mcg Oral Daily   pantoprazole  40 mg Oral Daily   sodium chloride flush  3 mL Intravenous Q12H   valACYclovir  1,000 mg Oral BID   Continuous Infusions:  amiodarone 30 mg/hr (04/28/23 0526)   isoproterenol (ISUPREL) 1 mg in dextrose 5 % 250 mL (0.004 mg/mL) infusion 1 mcg/min (04/28/23 0400)   PRN Meds: benzonatate, calcium carbonate, HYDROcodone-acetaminophen, nitroGLYCERIN, mouth rinse, sodium chloride flush   Vital Signs    Vitals:   04/28/23 0300 04/28/23 0400 04/28/23 0500 04/28/23 0600  BP: (!) 100/38 (!) 107/45 (!) 106/39 104/65  Pulse: 65 60 (!) 52 67  Resp: 20 10 (!) 25 14  Temp:  98 F (36.7 C)    TempSrc:  Oral    SpO2: 92% 97% 91% 95%  Weight:      Height:        Intake/Output Summary (Last 24 hours) at 04/28/2023 0809 Last data filed at 04/28/2023 0400 Gross per 24 hour  Intake 1157.7 ml  Output 1400 ml  Net -242.3 ml      04/26/2023    4:29 AM 04/24/2023    8:27 PM 07/26/2021    2:42 PM  Last 3 Weights  Weight (lbs) 201 lb 1 oz 206 lb 192 lb  Weight (kg) 91.2 kg 93.441 kg 87.091 kg      Telemetry    Sinus rhythm with PVCs- Personally Reviewed  ECG    None new- Personally Reviewed  Physical Exam   GEN: No acute distress.   Neck: No JVD Cardiac: Irregular, no murmurs, rubs, or gallops.  Respiratory: Clear to auscultation bilaterally. GI: Soft, nontender, non-distended  MS: No edema; No deformity. Neuro:  Nonfocal  Psych: Normal affect   Labs    High Sensitivity Troponin:   Recent Labs  Lab 04/24/23 2041 04/24/23 2224 04/25/23 0413  TROPONINIHS 30* 127* 293*     Chemistry Recent Labs   Lab 04/24/23 2224 04/25/23 0413 04/25/23 1844 04/26/23 0945 04/27/23 0304 04/28/23 0224  NA  --  140  --  136 135 138  K  --  3.9  --  4.4 3.9 3.8  CL  --  101  --  105 103 107  CO2  --  22  --  18* 19* 21*  GLUCOSE  --  102*  --  102* 107* 109*  BUN  --  18  --  18 20 13   CREATININE  --  1.08*   < > 1.08* 1.06* 1.05*  CALCIUM  --  9.3  --  8.7* 8.5* 8.5*  MG  --  2.2  --  2.1 2.1  --   PROT 6.7  --   --   --   --   --   ALBUMIN 3.8  --   --   --   --   --   AST 28  --   --   --   --   --   ALT 29  --   --   --   --   --   ALKPHOS 61  --   --   --   --   --  BILITOT 0.6  --   --   --   --   --   GFRNONAA  --  55*   < > 55* 56* 57*  ANIONGAP  --  17*  --  13 13 10    < > = values in this interval not displayed.    Lipids  Recent Labs  Lab 04/25/23 0413  CHOL 120  TRIG 23  HDL 56  LDLCALC 59  CHOLHDL 2.1    Hematology Recent Labs  Lab 04/26/23 0945 04/27/23 0304 04/28/23 0224  WBC 6.2 5.2 5.1  RBC 4.84 4.59 4.69  HGB 13.7 13.0 13.1  HCT 43.4 39.5 40.8  MCV 89.7 86.1 87.0  MCH 28.3 28.3 27.9  MCHC 31.6 32.9 32.1  RDW 13.6 13.5 13.7  PLT 187 183 163   Thyroid  Recent Labs  Lab 04/24/23 2224  TSH 4.826*  5.204*  FREET4 1.11    BNP Recent Labs  Lab 04/24/23 2041  BNP 248.1*    DDimer No results for input(s): "DDIMER" in the last 168 hours.   Radiology    MR CARDIAC VELOCITY FLOW MAP Result Date: 04/27/2023 CLINICAL DATA:  NSVT/PVCs EXAM: CARDIAC MRI TECHNIQUE: The patient was scanned on a 1.5 Tesla GE magnet. A dedicated cardiac coil was used. Functional imaging was done using Fiesta sequences. 2,3, and 4 chamber views were done to assess for RWMA's. Modified Simpson's rule using a short axis stack was used to calculate an ejection fraction on a dedicated work [employer]. The patient received 8 cc of Gadavist. After 10 minutes inversion recovery sequences were used to assess for infiltration and scar tissue. FINDINGS: Limited  images of the lung fields showed a small 6 mm RLL nodule. Normal left ventricular size and wall thickness. There was hypokinesis of the mid inferior wall. LV EF 52% though volumetric calculations difficult with frequent PVCs. Normal right ventricular size, RV EF 61%. Normal left and right atrial sizes. The aortic valve was not well-visualized. No significant aortic insufficiency. No significant mitral regurgitation noted. On delayed enhancement imaging, there was no myocardial late gadolinium enhancement (LGE). MEASUREMENTS: MEASUREMENTS LVEDV 120 mL LVEDVi 60 mL/m2 LVSV 62 mL LVEF 52% RVEDV 100 mL RVEDVi 50 mL/m2 RVSV 61 mL RVEF 61% T1 1132, ECV 30% Global T2 50 Aortic forward volume 61 mL Aortic regurgitant fraction 0% IMPRESSION: 1.  Technically difficult study due to frequent PVCs. 2.  Normal LV size with mid inferior hypokinesis, LV EF 52%. 3.  Normal RV size and systolic function, EF 61%. 4. No myocardial LGE, so no definitive evidence for prior MI, infiltrative disease, or myocarditis. 5. Borderline elevated extracellular volume percentage at 30%. This is nonspecific. 6.  Normal global T2 Wellsite geologist Electronically Signed   By: Marca Ancona M.D.   On: 04/27/2023 15:23   MR CARDIAC VELOCITY FLOW MAP Result Date: 04/27/2023 CLINICAL DATA:  NSVT/PVCs EXAM: CARDIAC MRI TECHNIQUE: The patient was scanned on a 1.5 Tesla GE magnet. A dedicated cardiac coil was used. Functional imaging was done using Fiesta sequences. 2,3, and 4 chamber views were done to assess for RWMA's. Modified Simpson's rule using a short axis stack was used to calculate an ejection fraction on a dedicated work [employer] officer, [employer]. The patient received 8 cc of Gadavist. After 10 minutes inversion recovery sequences were used to assess for infiltration and scar tissue. FINDINGS: Limited images of the lung fields showed a small 6 mm RLL nodule. Normal left ventricular size and wall  thickness. There was hypokinesis of the mid  inferior wall. LV EF 52% though volumetric calculations difficult with frequent PVCs. Normal right ventricular size, RV EF 61%. Normal left and right atrial sizes. The aortic valve was not well-visualized. No significant aortic insufficiency. No significant mitral regurgitation noted. On delayed enhancement imaging, there was no myocardial late gadolinium enhancement (LGE). MEASUREMENTS: MEASUREMENTS LVEDV 120 mL LVEDVi 60 mL/m2 LVSV 62 mL LVEF 52% RVEDV 100 mL RVEDVi 50 mL/m2 RVSV 61 mL RVEF 61% T1 1132, ECV 30% Global T2 50 Aortic forward volume 61 mL Aortic regurgitant fraction 0% IMPRESSION: 1.  Technically difficult study due to frequent PVCs. 2.  Normal LV size with mid inferior hypokinesis, LV EF 52%. 3.  Normal RV size and systolic function, EF 61%. 4. No myocardial LGE, so no definitive evidence for prior MI, infiltrative disease, or myocarditis. 5. Borderline elevated extracellular volume percentage at 30%. This is nonspecific. 6.  Normal global T2 Wellsite geologist Electronically Signed   By: Marca Ancona M.D.   On: 04/27/2023 15:23   MR CARDIAC MORPHOLOGY W WO CONTRAST Result Date: 04/27/2023 CLINICAL DATA:  NSVT/PVCs EXAM: CARDIAC MRI TECHNIQUE: The patient was scanned on a 1.5 Tesla GE magnet. A dedicated cardiac coil was used. Functional imaging was done using Fiesta sequences. 2,3, and 4 chamber views were done to assess for RWMA's. Modified Simpson's rule using a short axis stack was used to calculate an ejection fraction on a dedicated work [employer] officer, [employer]. The patient received 8 cc of Gadavist. After 10 minutes inversion recovery sequences were used to assess for infiltration and scar tissue. FINDINGS: Limited images of the lung fields showed a small 6 mm RLL nodule. Normal left ventricular size and wall thickness. There was hypokinesis of the mid inferior wall. LV EF 52% though volumetric calculations difficult with frequent PVCs. Normal right ventricular size, RV EF 61%. Normal  left and right atrial sizes. The aortic valve was not well-visualized. No significant aortic insufficiency. No significant mitral regurgitation noted. On delayed enhancement imaging, there was no myocardial late gadolinium enhancement (LGE). MEASUREMENTS: MEASUREMENTS LVEDV 120 mL LVEDVi 60 mL/m2 LVSV 62 mL LVEF 52% RVEDV 100 mL RVEDVi 50 mL/m2 RVSV 61 mL RVEF 61% T1 1132, ECV 30% Global T2 50 Aortic forward volume 61 mL Aortic regurgitant fraction 0% IMPRESSION: 1.  Technically difficult study due to frequent PVCs. 2.  Normal LV size with mid inferior hypokinesis, LV EF 52%. 3.  Normal RV size and systolic function, EF 61%. 4. No myocardial LGE, so no definitive evidence for prior MI, infiltrative disease, or myocarditis. 5. Borderline elevated extracellular volume percentage at 30%. This is nonspecific. 6.  Normal global T2 Wellsite geologist Electronically Signed   By: Marca Ancona M.D.   On: 04/27/2023 15:23    Cardiac Studies   Cardiac MRI 1.  Technically difficult study due to frequent PVCs.   2.  Normal LV size with mid inferior hypokinesis, LV EF 52%.   3.  Normal RV size and systolic function, EF 61%.   4. No myocardial LGE, so no definitive evidence for prior MI, infiltrative disease, or myocarditis.   5. Borderline elevated extracellular volume percentage at 30%. This is nonspecific.   6.  Normal global T2  Patient Profile     73 y.o. female presented to hospital with elevated PVCs and ventricular tachycardia, found to be COVID-positive  Assessment & Plan    PVCs/VT: Improved though still present.  Isopril is improving heart rhythm with increased  rate.  Is been switched to amiodarone which appears to be improving PVC burden.  MRI without scar or evidence of myocarditis.  She is feeling well without acute complaint.  Hypothyroidism: Continue Synthroid  CKD stage III: Stable  COVID-19 infection: Appears to be improving  For questions or updates, please contact [facility] Please consult [email or web address] for contact info under        Signed, Rosary Filosa Jorja Loa, MD  04/28/2023, 8:09 AM

## 2023-04-28 NOTE — Plan of Care (Signed)
  Problem: Education: Goal: Understanding of cardiac disease, CV risk reduction, and recovery process will improve Outcome: Progressing Goal: Individualized Educational Video(s) Outcome: Progressing   Problem: Activity: Goal: Ability to tolerate increased activity will improve Outcome: Progressing   Problem: Cardiac: Goal: Ability to achieve and maintain adequate cardiovascular perfusion will improve Outcome: Progressing   Problem: Health Behavior/Discharge Planning: Goal: Ability to safely manage health-related needs after discharge will improve Outcome: Progressing   Problem: Education: Goal: Knowledge of General Education information will improve Description: Including pain rating scale, medication(s)/side effects and non-pharmacologic comfort measures Outcome: Progressing   Problem: Health Behavior/Discharge Planning: Goal: Ability to manage health-related needs will improve Outcome: Progressing   Problem: Clinical Measurements: Goal: Ability to maintain clinical measurements within normal limits will improve Outcome: Progressing

## 2023-04-28 NOTE — Progress Notes (Signed)
 Patient originally listed as team B; Dr. Flora Lipps reached out to indicate he spoke with Dr. Shirlee Latch and they recommended patient be seen by EP today and round updated to EP. Will notify Dr. Elberta Fortis.

## 2023-04-28 NOTE — Progress Notes (Addendum)
 Swelling noted at PIV site along with leakage.  Current gtts stopped.  Stat IV team consult placed for new PIV.  Attempted to call [phone].  No answer.  Pt continues to have significant amount of ectopy on current gtt infusion that are currently being held for new line placement.

## 2023-04-29 DIAGNOSIS — I472 Ventricular tachycardia, unspecified: Secondary | ICD-10-CM | POA: Diagnosis not present

## 2023-04-29 DIAGNOSIS — I493 Ventricular premature depolarization: Secondary | ICD-10-CM | POA: Diagnosis not present

## 2023-04-29 LAB — BASIC METABOLIC PANEL
Anion gap: 11 (ref 5–15)
BUN: 9 mg/dL (ref 8–23)
CO2: 21 mmol/L — ABNORMAL LOW (ref 22–32)
Calcium: 9.1 mg/dL (ref 8.9–10.3)
Chloride: 106 mmol/L (ref 98–111)
Creatinine, Ser: 1.08 mg/dL — ABNORMAL HIGH (ref 0.44–1.00)
GFR, Estimated: 55 mL/min — ABNORMAL LOW (ref >60)
Glucose, Bld: 158 mg/dL — ABNORMAL HIGH (ref 70–99)
Potassium: 4.4 mmol/L (ref 3.5–5.1)
Sodium: 138 mmol/L (ref 135–145)

## 2023-04-29 LAB — CBC
HCT: 41.5 % (ref 36.0–46.0)
Hemoglobin: 13.6 g/dL (ref 12.0–15.0)
MCH: 28.4 pg (ref 26.0–34.0)
MCHC: 32.8 g/dL (ref 30.0–36.0)
MCV: 86.6 fL (ref 80.0–100.0)
Platelets: 200 10*3/uL (ref 150–400)
RBC: 4.79 MIL/uL (ref 3.87–5.11)
RDW: 13.5 % (ref 11.5–15.5)
WBC: 4 10*3/uL (ref 4.0–10.5)
nRBC: 0 % (ref 0.0–0.2)

## 2023-04-29 MED ORDER — ALPRAZOLAM 0.25 MG PO TABS
0.2500 mg | ORAL_TABLET | Freq: Three times a day (TID) | ORAL | Status: DC | PRN
  Administered 2023-04-29: 0.25 mg via ORAL
  Filled 2023-04-29 (×2): qty 1

## 2023-04-29 NOTE — Progress Notes (Incomplete)
  Patient Name: CIEARA STIERWALT Date of Encounter: 04/30/2023  Primary Cardiologist: Verne Carrow, MD Electrophysiologist: None  Interval Summary   The patient is doing well today.  Frustrated over multiple IV infiltrations with IV amio. At this time, the patient denies chest pain, shortness of breath, or any new concerns.  Vital Signs    Vitals:   04/29/23 1900 04/29/23 2000 04/29/23 2100 04/29/23 2200  BP: (!) 107/59 (!) 97/50 (!) 107/57 125/70  Pulse: (!) 59 (!) 57 64 77  Resp: 13 17 12  (!) 31  Temp:  98.2 F (36.8 C)    TempSrc:  Oral    SpO2: 96% 92% 96% 99%  Weight:      Height:        Intake/Output Summary (Last 24 hours) at 04/30/2023 0836 Last data filed at 04/29/2023 2200 Gross per 24 hour  Intake 1215.67 ml  Output --  Net 1215.67 ml   Filed Weights   04/24/23 2027 04/26/23 0429  Weight: 93.4 kg 91.2 kg    Physical Exam    GEN- The patient is well appearing, alert and oriented x 3 today.   Lungs- Clear to ausculation bilaterally, normal work of breathing Cardiac- Regular rate and rhythm, no murmurs, rubs or gallops GI- soft, NT, ND, + BS Extremities- no clubbing or cyanosis. No edema  Telemetry    SB 50-59 bpm, occ PVC's (personally reviewed)  Hospital Course    MACHEL VIOLANTE is a 73 y.o. female admitted with VT, increased PVC's & found to be COVID positive. Cardiac MRI without LGE, LVEF 52%.   Assessment & Plan    VT  PVC's  High Risk Drug Monitoring: Amiodarone  -continue amiodarone, transition to 400 mg BID PO  -consider outpatient cardiac PET given cMRI negative   COVID Positive -per primary team   Hypothyroidism  -synthroid   CKD III  -stable  -Trend BMP / urinary output -Replace electrolytes as indicated -Avoid nephrotoxic agents, ensure adequate renal perfusion    For questions or updates, please contact [facility] Please consult [email or web address] for contact info under Cardiology/STEMI.  Signed, Canary Brim,  NP-C, AGACNP-BC [facility] - Electrophysiology  04/30/2023, 8:36 AM

## 2023-04-29 NOTE — Plan of Care (Signed)
  Problem: Activity: Goal: Ability to tolerate increased activity will improve Outcome: Progressing   Problem: Cardiac: Goal: Ability to achieve and maintain adequate cardiovascular perfusion will improve Outcome: Progressing   Problem: Health Behavior/Discharge Planning: Goal: Ability to safely manage health-related needs after discharge will improve Outcome: Progressing   Problem: Education: Goal: Knowledge of General Education information will improve Description: Including pain rating scale, medication(s)/side effects and non-pharmacologic comfort measures Outcome: Progressing   Problem: Health Behavior/Discharge Planning: Goal: Ability to manage health-related needs will improve Outcome: Progressing   Problem: Clinical Measurements: Goal: Ability to maintain clinical measurements within normal limits will improve Outcome: Progressing Goal: Will remain free from infection Outcome: Progressing Goal: Diagnostic test results will improve Outcome: Progressing Goal: Respiratory complications will improve Outcome: Progressing Goal: Cardiovascular complication will be avoided Outcome: Progressing   Problem: Activity: Goal: Risk for activity intolerance will decrease Outcome: Progressing   Problem: Nutrition: Goal: Adequate nutrition will be maintained Outcome: Progressing   Problem: Coping: Goal: Level of anxiety will decrease Outcome: Progressing   Problem: Elimination: Goal: Will not experience complications related to bowel motility Outcome: Progressing Goal: Will not experience complications related to urinary retention Outcome: Progressing   Problem: Pain Managment: Goal: General experience of comfort will improve and/or be controlled Outcome: Progressing   Problem: Safety: Goal: Ability to remain free from injury will improve Outcome: Progressing   Problem: Skin Integrity: Goal: Risk for impaired skin integrity will decrease Outcome: Progressing    Problem: Education: Goal: Understanding of CV disease, CV risk reduction, and recovery process will improve Outcome: Progressing   Problem: Activity: Goal: Ability to return to baseline activity level will improve Outcome: Progressing   Problem: Cardiovascular: Goal: Ability to achieve and maintain adequate cardiovascular perfusion will improve Outcome: Progressing

## 2023-04-29 NOTE — Progress Notes (Signed)
 Rounding Note    Patient Name: Jaime Fuller Date of Encounter: 04/29/2023  [facility] Cardiologist: Verne Carrow, MD   Subjective   Intermittent bigeminy but improved ectopy. No acute complaints  Inpatient Medications    Scheduled Meds:  Chlorhexidine Gluconate Cloth  6 each Topical Daily   levothyroxine  50 mcg Oral Daily   pantoprazole  40 mg Oral Daily   sodium chloride flush  3 mL Intravenous Q12H   valACYclovir  1,000 mg Oral BID   Continuous Infusions:  amiodarone 30 mg/hr (04/29/23 0400)   isoproterenol (ISUPREL) 1 mg in dextrose 5 % 250 mL (0.004 mg/mL) infusion 1 mcg/min (04/29/23 0400)   PRN Meds: benzonatate, calcium carbonate, HYDROcodone-acetaminophen, nitroGLYCERIN, mouth rinse, sodium chloride flush   Vital Signs    Vitals:   04/29/23 0100 04/29/23 0200 04/29/23 0300 04/29/23 0400  BP: (!) 107/48 (!) 128/49 107/62 (!) 111/56  Pulse: 65 (!) 57 (!) 57 72  Resp: 15 13 14 15   Temp:    98.1 F (36.7 C)  TempSrc:    Oral  SpO2: (!) 89% 93% 90% 97%  Weight:      Height:        Intake/Output Summary (Last 24 hours) at 04/29/2023 0743 Last data filed at 04/29/2023 0400 Gross per 24 hour  Intake 1977.9 ml  Output --  Net 1977.9 ml      04/26/2023    4:29 AM 04/24/2023    8:27 PM 07/26/2021    2:42 PM  Last 3 Weights  Weight (lbs) 201 lb 1 oz 206 lb 192 lb  Weight (kg) 91.2 kg 93.441 kg 87.091 kg      Telemetry    Sinus rhythm with PVCs - Personally Reviewed  ECG    None new - Personally Reviewed  Physical Exam   GEN: No acute distress.   Neck: No JVD Cardiac: RRR, no murmurs, rubs, or gallops.  Respiratory: Clear to auscultation bilaterally. GI: Soft, nontender, non-distended  MS: No edema; No deformity. Neuro:  Nonfocal  Psych: Normal affect   Labs    High Sensitivity Troponin:   Recent Labs  Lab 04/24/23 2041 04/24/23 2224 04/25/23 0413  TROPONINIHS 30* 127* 293*     Chemistry Recent Labs  Lab  04/24/23 2224 04/25/23 0413 04/25/23 1844 04/26/23 0945 04/27/23 0304 04/28/23 0224  NA  --  140  --  136 135 138  K  --  3.9  --  4.4 3.9 3.8  CL  --  101  --  105 103 107  CO2  --  22  --  18* 19* 21*  GLUCOSE  --  102*  --  102* 107* 109*  BUN  --  18  --  18 20 13   CREATININE  --  1.08*   < > 1.08* 1.06* 1.05*  CALCIUM  --  9.3  --  8.7* 8.5* 8.5*  MG  --  2.2  --  2.1 2.1  --   PROT 6.7  --   --   --   --   --   ALBUMIN 3.8  --   --   --   --   --   AST 28  --   --   --   --   --   ALT 29  --   --   --   --   --   ALKPHOS 61  --   --   --   --   --  BILITOT 0.6  --   --   --   --   --   GFRNONAA  --  55*   < > 55* 56* 57*  ANIONGAP  --  17*  --  13 13 10    < > = values in this interval not displayed.    Lipids  Recent Labs  Lab 04/25/23 0413  CHOL 120  TRIG 23  HDL 56  LDLCALC 59  CHOLHDL 2.1    Hematology Recent Labs  Lab 04/26/23 0945 04/27/23 0304 04/28/23 0224  WBC 6.2 5.2 5.1  RBC 4.84 4.59 4.69  HGB 13.7 13.0 13.1  HCT 43.4 39.5 40.8  MCV 89.7 86.1 87.0  MCH 28.3 28.3 27.9  MCHC 31.6 32.9 32.1  RDW 13.6 13.5 13.7  PLT 187 183 163   Thyroid  Recent Labs  Lab 04/24/23 2224  TSH 4.826*  5.204*  FREET4 1.11    BNP Recent Labs  Lab 04/24/23 2041  BNP 248.1*    DDimer No results for input(s): "DDIMER" in the last 168 hours.   Radiology    MR CARDIAC VELOCITY FLOW MAP Result Date: 04/27/2023 CLINICAL DATA:  NSVT/PVCs EXAM: CARDIAC MRI TECHNIQUE: The patient was scanned on a 1.5 Tesla GE magnet. A dedicated cardiac coil was used. Functional imaging was done using Fiesta sequences. 2,3, and 4 chamber views were done to assess for RWMA's. Modified Simpson's rule using a short axis stack was used to calculate an ejection fraction on a dedicated work Research officer, [employer]. The patient received 8 cc of Gadavist. After 10 minutes inversion recovery sequences were used to assess for infiltration and scar tissue. FINDINGS: Limited images of  the lung fields showed a small 6 mm RLL nodule. Normal left ventricular size and wall thickness. There was hypokinesis of the mid inferior wall. LV EF 52% though volumetric calculations difficult with frequent PVCs. Normal right ventricular size, RV EF 61%. Normal left and right atrial sizes. The aortic valve was not well-visualized. No significant aortic insufficiency. No significant mitral regurgitation noted. On delayed enhancement imaging, there was no myocardial late gadolinium enhancement (LGE). MEASUREMENTS: MEASUREMENTS LVEDV 120 mL LVEDVi 60 mL/m2 LVSV 62 mL LVEF 52% RVEDV 100 mL RVEDVi 50 mL/m2 RVSV 61 mL RVEF 61% T1 1132, ECV 30% Global T2 50 Aortic forward volume 61 mL Aortic regurgitant fraction 0% IMPRESSION: 1.  Technically difficult study due to frequent PVCs. 2.  Normal LV size with mid inferior hypokinesis, LV EF 52%. 3.  Normal RV size and systolic function, EF 61%. 4. No myocardial LGE, so no definitive evidence for prior MI, infiltrative disease, or myocarditis. 5. Borderline elevated extracellular volume percentage at 30%. This is nonspecific. 6.  Normal global T2 Wellsite geologist Electronically Signed   By: Marca Ancona M.D.   On: 04/27/2023 15:23   MR CARDIAC VELOCITY FLOW MAP Result Date: 04/27/2023 CLINICAL DATA:  NSVT/PVCs EXAM: CARDIAC MRI TECHNIQUE: The patient was scanned on a 1.5 Tesla GE magnet. A dedicated cardiac coil was used. Functional imaging was done using Fiesta sequences. 2,3, and 4 chamber views were done to assess for RWMA's. Modified Simpson's rule using a short axis stack was used to calculate an ejection fraction on a dedicated work Research officer, trade [employer]. The patient received 8 cc of Gadavist. After 10 minutes inversion recovery sequences were used to assess for infiltration and scar tissue. FINDINGS: Limited images of the lung fields showed a small 6 mm RLL nodule. Normal left ventricular size and wall  thickness. There was hypokinesis of the mid inferior wall.  LV EF 52% though volumetric calculations difficult with frequent PVCs. Normal right ventricular size, RV EF 61%. Normal left and right atrial sizes. The aortic valve was not well-visualized. No significant aortic insufficiency. No significant mitral regurgitation noted. On delayed enhancement imaging, there was no myocardial late gadolinium enhancement (LGE). MEASUREMENTS: MEASUREMENTS LVEDV 120 mL LVEDVi 60 mL/m2 LVSV 62 mL LVEF 52% RVEDV 100 mL RVEDVi 50 mL/m2 RVSV 61 mL RVEF 61% T1 1132, ECV 30% Global T2 50 Aortic forward volume 61 mL Aortic regurgitant fraction 0% IMPRESSION: 1.  Technically difficult study due to frequent PVCs. 2.  Normal LV size with mid inferior hypokinesis, LV EF 52%. 3.  Normal RV size and systolic function, EF 61%. 4. No myocardial LGE, so no definitive evidence for prior MI, infiltrative disease, or myocarditis. 5. Borderline elevated extracellular volume percentage at 30%. This is nonspecific. 6.  Normal global T2 Wellsite geologist Electronically Signed   By: Marca Ancona M.D.   On: 04/27/2023 15:23   MR CARDIAC MORPHOLOGY W WO CONTRAST Result Date: 04/27/2023 CLINICAL DATA:  NSVT/PVCs EXAM: CARDIAC MRI TECHNIQUE: The patient was scanned on a 1.5 Tesla GE magnet. A dedicated cardiac coil was used. Functional imaging was done using Fiesta sequences. 2,3, and 4 chamber views were done to assess for RWMA's. Modified Simpson's rule using a short axis stack was used to calculate an ejection fraction on a dedicated work Research officer, trade [employer]. The patient received 8 cc of Gadavist. After 10 minutes inversion recovery sequences were used to assess for infiltration and scar tissue. FINDINGS: Limited images of the lung fields showed a small 6 mm RLL nodule. Normal left ventricular size and wall thickness. There was hypokinesis of the mid inferior wall. LV EF 52% though volumetric calculations difficult with frequent PVCs. Normal right ventricular size, RV EF 61%. Normal left and right  atrial sizes. The aortic valve was not well-visualized. No significant aortic insufficiency. No significant mitral regurgitation noted. On delayed enhancement imaging, there was no myocardial late gadolinium enhancement (LGE). MEASUREMENTS: MEASUREMENTS LVEDV 120 mL LVEDVi 60 mL/m2 LVSV 62 mL LVEF 52% RVEDV 100 mL RVEDVi 50 mL/m2 RVSV 61 mL RVEF 61% T1 1132, ECV 30% Global T2 50 Aortic forward volume 61 mL Aortic regurgitant fraction 0% IMPRESSION: 1.  Technically difficult study due to frequent PVCs. 2.  Normal LV size with mid inferior hypokinesis, LV EF 52%. 3.  Normal RV size and systolic function, EF 61%. 4. No myocardial LGE, so no definitive evidence for prior MI, infiltrative disease, or myocarditis. 5. Borderline elevated extracellular volume percentage at 30%. This is nonspecific. 6.  Normal global T2 Wellsite geologist Electronically Signed   By: Marca Ancona M.D.   On: 04/27/2023 15:23    Cardiac Studies     Patient Profile     73 y.o. female presented to the hospital with PVCs and VT, found to be COVID positive  Assessment & Plan    PVC/VT: improved though still in bigeminy occasionally. Ambrosia Wisnewski continue IV amiodarone today. MRI without scar. No acute complaints. Damaso Laday stop isuprel today and assess ectopy burden. Hypothyroidism: continue synthroid CKD stage III: stable COVID19 infection: appears to be improving.  For questions or updates, please contact [facility] Please consult [email or web address] for contact info under        Signed, Kaiana Marion Jorja Loa, MD  04/29/2023, 7:43 AM

## 2023-04-30 ENCOUNTER — Other Ambulatory Visit (HOSPITAL_COMMUNITY): Payer: Self-pay

## 2023-04-30 DIAGNOSIS — I472 Ventricular tachycardia, unspecified: Secondary | ICD-10-CM | POA: Diagnosis not present

## 2023-04-30 DIAGNOSIS — I493 Ventricular premature depolarization: Secondary | ICD-10-CM | POA: Diagnosis not present

## 2023-04-30 DIAGNOSIS — I214 Non-ST elevation (NSTEMI) myocardial infarction: Secondary | ICD-10-CM | POA: Diagnosis not present

## 2023-04-30 LAB — BASIC METABOLIC PANEL
Anion gap: 12 (ref 5–15)
BUN: 15 mg/dL (ref 8–23)
CO2: 21 mmol/L — ABNORMAL LOW (ref 22–32)
Calcium: 9.1 mg/dL (ref 8.9–10.3)
Chloride: 104 mmol/L (ref 98–111)
Creatinine, Ser: 0.97 mg/dL (ref 0.44–1.00)
GFR, Estimated: >60 mL/min (ref >60)
Glucose, Bld: 94 mg/dL (ref 70–99)
Potassium: 4.1 mmol/L (ref 3.5–5.1)
Sodium: 137 mmol/L (ref 135–145)

## 2023-04-30 LAB — CBC
HCT: 40.8 % (ref 36.0–46.0)
Hemoglobin: 13.4 g/dL (ref 12.0–15.0)
MCH: 28.6 pg (ref 26.0–34.0)
MCHC: 32.8 g/dL (ref 30.0–36.0)
MCV: 87 fL (ref 80.0–100.0)
Platelets: 214 10*3/uL (ref 150–400)
RBC: 4.69 MIL/uL (ref 3.87–5.11)
RDW: 13.5 % (ref 11.5–15.5)
WBC: 5.9 10*3/uL (ref 4.0–10.5)
nRBC: 0 % (ref 0.0–0.2)

## 2023-04-30 LAB — MAGNESIUM: Magnesium: 2.1 mg/dL (ref 1.7–2.4)

## 2023-04-30 MED ORDER — BENZONATATE 100 MG PO CAPS
100.0000 mg | ORAL_CAPSULE | Freq: Three times a day (TID) | ORAL | 0 refills | Status: DC | PRN
  Filled 2023-04-30: qty 20, 7d supply, fill #0

## 2023-04-30 MED ORDER — AMIODARONE HCL 200 MG PO TABS
400.0000 mg | ORAL_TABLET | Freq: Two times a day (BID) | ORAL | Status: DC
  Administered 2023-04-30: 400 mg via ORAL
  Filled 2023-04-30: qty 2

## 2023-04-30 MED ORDER — AMIODARONE HCL 200 MG PO TABS
ORAL_TABLET | ORAL | 6 refills | Status: DC
  Filled 2023-04-30: qty 65, 30d supply, fill #0
  Filled 2023-05-24: qty 30, 30d supply, fill #0

## 2023-04-30 NOTE — Discharge Summary (Addendum)
 Advanced Heart Failure Team  Discharge Summary   Patient ID: Jaime Fuller MRN: [id number], DOB/AGE: 1950/05/11 73 y.o. Admit date: 04/24/2023 D/C date:     04/30/2023   Primary Discharge Diagnoses:  Chest Pain  PVC/NSVT  COVID   Secondary Discharge Diagnoses:  Hypothyroid CKD Stage IIIb GERD  Hospital Course:   Ms Jaime Fuller is a 73 year old with a history of hypothyroidism, anxiety, PVC, nonobstructive CAD on CTA 2022, and CKD Stage IIIb.   Presented to [facility] with chest pain and increased shortness of breath. EKG showed bradycardia with PVCs/NSVT. HS Trop 30>127>293. Given IV lasix and SL NTG. Taken to cath lab which showed normal coronaries. Echo showed EF 50-55% with regional wall abnormalities.   + COVID on admit with minimal cough. EP consulted for frequent PVCs/NSVT/bradycardia. Placed on Isoproterenol and lidocaine drip. Eventually wean off IV antiarrhythmic and placed on oral amiodarone was taper in place. CMRI to further assess etiology. CMRI was not revealing, LVEF 52% and RV 62% with no LGE. Etiology for ventricular arrhythmias remains unclear. Plan to complete Cardiac Pet as an outpatient.   She was assessed by EP and Dr Jaime Fuller and deemed stable for discharge given PVCs burden was significantly lower. Able to walk around the unit without difficulty. She will continue to be followed EP.   Discharge Vitals: Blood pressure (!) 91/46, pulse (!) 47, temperature 98.1 F (36.7 C), temperature source Oral, resp. rate 13, height 5\' 2"  (1.575 m), weight 91.2 kg, SpO2 90%.  Labs: Lab Results  Component Value Date   WBC 5.9 04/30/2023   HGB 13.4 04/30/2023   HCT 40.8 04/30/2023   MCV 87.0 04/30/2023   PLT 214 04/30/2023    Recent Labs  Lab 04/24/23 2224 04/25/23 0413 04/30/23 0510  NA  --    < > 137  K  --    < > 4.1  CL  --    < > 104  CO2  --    < > 21*  BUN  --    < > 15  CREATININE  --    < > 0.97  CALCIUM  --    < > 9.1  PROT 6.7  --   --   BILITOT 0.6  --    --   ALKPHOS 61  --   --   ALT 29  --   --   AST 28  --   --   GLUCOSE  --    < > 94   < > = values in this interval not displayed.   Lab Results  Component Value Date   CHOL 120 04/25/2023   HDL 56 04/25/2023   LDLCALC 59 04/25/2023   TRIG 23 04/25/2023   BNP (last 3 results) Recent Labs    04/24/23 2041  BNP 248.1*    ProBNP (last 3 results) No results for input(s): "PROBNP" in the last 8760 hours.   Diagnostic Studies/Procedures  [facility] 04/25/2023 Widely patent coronary arteries with no obstructive CAD 2.  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  Technically difficult study due to frequent PVCs.  2.  Normal LV size with mid inferior hypokinesis, LV EF 52%.  3.  Normal RV size and systolic function, EF 61%.  4. No myocardial LGE, so no definitive evidence for prior MI,infiltrative disease, or myocarditis.  5. Borderline elevated extracellular  volume percentage at 30%. This s nonspecific. 6.  Normal global T2 Discharge Medications   Allergies as of 04/30/2023       Reactions   Bee Venom Anaphylaxis   Vicodin [hydrocodone-acetaminophen] Itching        Medication List     TAKE these medications    amiodarone 200 MG tablet Commonly known as: PACERONE Take 400 mg twice a day x 10 days then 400 mg daily x 5 days then 200 mg daily   benzonatate 100 MG capsule Commonly known as: TESSALON Take 1 capsule (100 mg total) by mouth 3 (three) times daily as needed for cough.   cyanocobalamin 1000 MCG tablet Take 1,000 mcg by mouth daily.   EPINEPHrine 0.3 mg/0.3 mL Soaj injection Commonly known as: EPI-PEN Inject 0.3 mLs (0.3 mg total) into the muscle as needed for anaphylaxis.   levothyroxine 50 MCG tablet Commonly known as: SYNTHROID TAKE 1 TABLET DAILY What changed: when to take this   methocarbamol 500 MG tablet Commonly known as: ROBAXIN Take 500 mg by  mouth every 6 (six) hours as needed for muscle spasms.   Percocet 5-325 MG tablet Generic drug: oxyCODONE-acetaminophen Take 1 tablet by mouth every 6 (six) hours as needed for moderate pain (pain score 4-6).   traMADol 50 MG tablet Commonly known as: ULTRAM Take 50 mg by mouth as needed.   valACYclovir 1000 MG tablet Commonly known as: VALTREX ONE TABLET TWICE A DAY X 5 DAYS AS NEEDED FOR OUTBREAK        Disposition   The patient will be discharged in stable condition to home. Discharge Instructions     (HEART FAILURE PATIENTS) Call MD:  Anytime you have any of the following symptoms: 1) 3 pound weight gain in 24 hours or 5 pounds in 1 week 2) shortness of breath, with or without a dry hacking cough 3) swelling in the hands, feet or stomach 4) if you have to sleep on extra pillows at night in order to breathe.   Complete by: As directed    Diet - low sodium heart healthy   Complete by: As directed    Increase activity slowly   Complete by: As directed           Duration of Discharge Encounter: 25 minutes   Signed, Amy Clegg NP-C  04/30/2023, 12:12 PM   Patient seen and examined with the above-signed Advanced Practice Provider and/or Housestaff. I personally reviewed laboratory data, imaging studies and relevant notes. I independently examined the patient and formulated the important aspects of the plan. I have edited the note to reflect any of my changes or salient points. I have personally discussed the plan with the patient and/or family.  Feeling much better. Walking halls. No CP or SOB. PVCs/NSVT now much improved on amio. Still having a few PVCs.   cMRI EF 61% no LGE or other abnormality   General:  Well appearing. No resp difficulty HEENT: normal Neck: supple. no JVD. Carotids 2+ bilat; no bruits. No lymphadenopathy or thryomegaly appreciated. Cor: PMI nondisplaced. Regular rate & rhythm. No rubs, gallops or murmurs. Lungs: clear Abdomen: obese soft,  nontender, nondistended. No hepatosplenomegaly. No bruits or masses. Good bowel sounds. Extremities: no cyanosis, clubbing, rash, edema Neuro: alert & orientedx3, cranial nerves grossly intact. moves all 4 extremities w/o difficulty. Affect pleasant  PVCs/NSVT now stable on amio. Switching IV to po.   EF normal on MRI. No LGE Personally reviewed  Case discussed with EP team personally.  Ok to discharge home on po amiodarone with plans for outpatient PET.  EP to follow as outpatient  Will need outpatient sleep study  D/c time 37 minutes  Arvilla Meres, MD  2:29 PM

## 2023-04-30 NOTE — Progress Notes (Signed)
   Rounding Note    Patient Name: Jaime Fuller Date of Encounter: 04/30/2023  [facility] Cardiologist: Verne Carrow, MD   Subjective   No acute events overnight.  Rhythm has remained stable. Vital Signs    Vitals:   04/30/23 0700 04/30/23 0800 04/30/23 0900 04/30/23 1130  BP: 100/66 (!) 97/48 (!) 91/46   Pulse: (!) 51 (!) 52 (!) 47   Resp: 15 12 13    Temp:    98.1 F (36.7 C)  TempSrc:    Oral  SpO2: 99% 90% 90%   Weight:      Height:        Intake/Output Summary (Last 24 hours) at 04/30/2023 1137 Last data filed at 04/30/2023 0700 Gross per 24 hour  Intake 1354.6 ml  Output --  Net 1354.6 ml      04/26/2023    4:29 AM 04/24/2023    8:27 PM 07/26/2021    2:42 PM  Last 3 Weights  Weight (lbs) 201 lb 1 oz 206 lb 192 lb  Weight (kg) 91.2 kg 93.441 kg 87.091 kg      Telemetry    Minimal ectopy - Personally Reviewed  ECG    Personally Reviewed  Physical Exam   GEN: No acute distress.   Cardiac: RRR, no murmurs, rubs, or gallops. No edema. Respiratory: Clear to auscultation bilaterally. Psych: Normal affect   Assessment & Plan     #Ventricular tachycardia #PVCs Improved. Continue amiodarone 400 mg by mouth twice daily.  Plan for 10 days of twice daily therapy and then decrease to 400 daily for 5 days and then decrease to 200 mg by mouth daily thereafter. Will need repeat CMP, TSH and free T4 in 6 to 8 weeks.      Sheria Lang T. Lalla Brothers, MD, [facility], [facility] Cardiac Electrophysiology

## 2023-04-30 NOTE — TOC Transition Note (Addendum)
 Transition of Care [facility]) - Discharge Note   Patient Details  Name: Jaime Fuller MRN: [id number] Date of Birth: 01-26-1951  [facility] CM/SW Contact:  Elliot Cousin, RN Phone Number: [phone] 04/30/2023, 12:25 PM   Clinical Narrative:    HF TOC CM spoke to pt and states she does not feel HH is needed. States she has RW, cane and scale at home. Husband at home and dtr will assist as needed. Dtr will provide transportation to home. Contacted [facility] to make aware.   PCP appt scheduled for 2/24 at 1130 am.      Final next level of care: Home/Self Care Barriers to Discharge: No Barriers Identified   Patient Goals and CMS Choice Patient states their goals for this hospitalization and ongoing recovery are:: wants to remain independent CMS Medicare.gov Compare Post Acute Care list provided to:: Patient Choice offered to / list presented to : Patient      Discharge Placement                       Discharge Plan and Services Additional resources added to the After Visit Summary for     Discharge Planning Services: CM Consult Post Acute Care Choice: Home Health                    HH Arranged: RN, PT [facility] Agency: [facility] Date [facility] Agency Contacted: 04/27/23 Time HH Agency Contacted: 1632 Representative spoke with at [facility] Agency: Lorenza Chick  Social Drivers of Health (SDOH) Interventions SDOH Screenings   Food Insecurity: No Food Insecurity (04/25/2023)  Housing: Low Risk  (04/25/2023)  Transportation Needs: No Transportation Needs (04/25/2023)  Utilities: Not At Risk (04/25/2023)  Depression (PHQ2-9): Low Risk  (06/15/2021)  Social Connections: Moderately Isolated (04/25/2023)  Tobacco Use: Medium Risk (04/24/2023)     Readmission Risk Interventions     No data to display

## 2023-05-01 ENCOUNTER — Telehealth: Payer: Self-pay

## 2023-05-01 NOTE — Transitions of Care (Post Inpatient/ED Visit) (Signed)
 05/01/2023  Name: Jaime Fuller MRN: [id number] DOB: Mar 18, 1950  Today's TOC FU Call Status: Today's TOC FU Call Status:: Successful TOC FU Call Completed TOC FU Call Complete Date: 05/01/23 Patient's Name and Date of Birth confirmed.  Transition Care Management Follow-up Telephone Call Date of Discharge: 04/30/23 Discharge Facility: [facility]) Type of Discharge: Inpatient Admission Primary Inpatient Discharge Diagnosis:: chest pain How have you been since you were released from the hospital?: Better Any questions or concerns?: No  Items Reviewed: Did you receive and understand the discharge instructions provided?: Yes Medications obtained,verified, and reconciled?: Yes (Medications Reviewed) Any new allergies since your discharge?: No Dietary orders reviewed?: Yes  Medications Reviewed Today: Medications Reviewed Today     Reviewed by Karena Addison, LPN (Licensed Practical Nurse) on 05/01/23 at 1006  Med List Status: <None>   Medication Order Taking? Sig Documenting Provider Last Dose Status Informant  amiodarone (PACERONE) 200 MG tablet [id number] Yes Take 400 mg twice a day x 10 days then 400 mg daily x 5 days then 200 mg daily Clegg, Amy D, NP Taking Active   benzonatate (TESSALON) 100 MG capsule [id number] No Take 1 capsule (100 mg total) by mouth 3 (three) times daily as needed for cough.  Patient not taking: Reported on 05/01/2023   Sherald Hess, NP Not Taking Active   cyanocobalamin 1000 MCG tablet [id number] Yes Take 1,000 mcg by mouth daily. [provider] Taking Active Self, Pharmacy Records  EPINEPHrine 0.3 mg/0.3 mL IJ SOAJ injection [id number] Yes Inject 0.3 mLs (0.3 mg total) into the muscle as needed for anaphylaxis. Philip Aspen, Limmie Patricia, MD Taking Active Self, Pharmacy Records           Med Note Nedra Hai, NICOLE   Wed Apr 25, 2023  5:08 AM) Pt keeps on hand for emergencies    levothyroxine (SYNTHROID) 50 MCG tablet [id number] Yes TAKE 1 TABLET  DAILY  Patient taking differently: Take 50 mcg by mouth at bedtime.   Philip Aspen, Limmie Patricia, MD Taking Active Self, Pharmacy Records  methocarbamol (ROBAXIN) 500 MG tablet [id number] No Take 500 mg by mouth every 6 (six) hours as needed for muscle spasms.  Patient not taking: Reported on 05/01/2023   [provider] Not Taking Active Self, Pharmacy Records  PERCOCET 5-325 MG tablet [id number] No Take 1 tablet by mouth every 6 (six) hours as needed for moderate pain (pain score 4-6).  Patient not taking: Reported on 05/01/2023   [provider] Not Taking Active Self, Pharmacy Records           Med Note (LEE, NICOLE   Wed Apr 25, 2023  5:12 AM) Pt. Keeps on hand for severe back pain   traMADol (ULTRAM) 50 MG tablet [id number] No Take 50 mg by mouth as needed.  Patient not taking: Reported on 04/25/2023   [provider] Not Taking Active Self, Pharmacy Records           Med Note (LEE, NICOLE   Wed Apr 25, 2023  5:19 AM) Pt is out of refills  valACYclovir (VALTREX) 1000 MG tablet [id number] No ONE TABLET TWICE A DAY X 5 DAYS AS NEEDED FOR OUTBREAK  Patient not taking: Reported on 05/01/2023   Philip Aspen, Limmie Patricia, MD Not Taking Active Self, Pharmacy Records           Med Note (LEE, NICOLE   Wed Apr 25, 2023  5:15 AM) Last dose 8-72mos ago; Pt keeps on hand for outbreaks  Home Care and Equipment/Supplies: Were Home Health Services Ordered?: NA Any new equipment or medical supplies ordered?: NA  Functional Questionnaire: Do you need assistance with bathing/showering or dressing?: No Do you need assistance with meal preparation?: No Do you need assistance with eating?: No Do you have difficulty maintaining continence: No Do you need assistance with getting out of bed/getting out of a chair/moving?: No Do you have difficulty managing or taking your medications?: No  Follow up appointments reviewed: PCP Follow-up appointment confirmed?:  Yes Date of PCP follow-up appointment?: 05/07/23 Follow-up Provider: [facility] Follow-up appointment confirmed?: Yes Date of Specialist follow-up appointment?: 06/12/23 Follow-Up Specialty Provider:: CV Do you need transportation to your follow-up appointment?: No Do you understand care options if your condition(s) worsen?: Yes-patient verbalized understanding    SIGNATURE Karena Addison, LPN [facility] Nurse Health Advisor Direct Dial [phone]

## 2023-05-07 ENCOUNTER — Telehealth: Payer: Self-pay | Admitting: *Deleted

## 2023-05-07 ENCOUNTER — Ambulatory Visit: Payer: Medicare Other | Admitting: Internal Medicine

## 2023-05-07 ENCOUNTER — Encounter: Payer: Self-pay | Admitting: Internal Medicine

## 2023-05-07 ENCOUNTER — Telehealth: Payer: Self-pay

## 2023-05-07 VITALS — BP 120/68 | HR 64 | Temp 97.5°F | Wt 198.2 lb

## 2023-05-07 DIAGNOSIS — I472 Ventricular tachycardia, unspecified: Secondary | ICD-10-CM

## 2023-05-07 DIAGNOSIS — Z09 Encounter for follow-up examination after completed treatment for conditions other than malignant neoplasm: Secondary | ICD-10-CM

## 2023-05-07 DIAGNOSIS — R911 Solitary pulmonary nodule: Secondary | ICD-10-CM | POA: Diagnosis not present

## 2023-05-07 DIAGNOSIS — R7989 Other specified abnormal findings of blood chemistry: Secondary | ICD-10-CM

## 2023-05-07 NOTE — Progress Notes (Signed)
 Hospital follow-up visit     CC/Reason for Visit: Hospitalization follow-up  HPI: Jaime Fuller is a 73 y.o. female who is coming in today for the above mentioned reasons, specifically transitional care services face-to-face visit.    Dates hospitalized: 11/25-2 17/25 Days since discharge from hospital: 8 Patient was discharged from the hospital to: Home Reason for admission to hospital: Chest pain, nonsustained V. tach, frequent PVCs Date of interactive phone contact with patient and/or caregiver: 218/25  I have reviewed in detail patient's discharge summary plus pertinent specific notes, labs, and images from the hospitalization.  Yes  Patient was admitted to the hospital on February 11 with chest discomfort and palpitations and shortness of breath.  She did end up testing positive for COVID with minimal cough.  She was found to be bradycardic with nonsustained ventricular tachycardia.  Had a cardiac cath which showed normal coronaries.  She was initially placed on lidocaine drip and transitioned over to oral amiodarone.  She has follow-up scheduled with EP and routine cardiology.  She is having flushing and low-grade temperatures with amiodarone.  Medication reconciliation was done today and patient is taking meds as recommended by discharging hospitalist/specialist.  Yes   Past Medical/Surgical History: Past Medical History:  Diagnosis Date   Anxiety state, unspecified 09/20/2007   Arthritis    Cataract    Chronic kidney disease    kidney stones   HYPOTHYROIDISM 03/08/2007   no meds needed now 04-15-19   Pancreatitis    secondary to ERCP    Past Surgical History:  Procedure Laterality Date   abdominal plasty     Carpel tunnel surgery left hand     Childbirth x 2     CHOLECYSTECTOMY     GASTRIC BYPASS     LEFT HEART CATH AND CORONARY ANGIOGRAPHY N/A 04/25/2023   Procedure: LEFT HEART CATH AND CORONARY ANGIOGRAPHY;  Surgeon: Tonny Bollman, MD;  Location: [facility]  INVASIVE CV LAB;  Service: Cardiovascular;  Laterality: N/A;   UPPER GASTROINTESTINAL ENDOSCOPY     WISDOM TOOTH EXTRACTION      Social History:  reports that she has quit smoking. She has been exposed to tobacco smoke. She has never used smokeless tobacco. She reports that she does not drink alcohol and does not use drugs.  Allergies: Allergies  Allergen Reactions   Bee Venom Anaphylaxis   Vicodin [Hydrocodone-Acetaminophen] Itching    Family History:  Family History  Problem Relation Age of Onset   Thyroid disease Mother    Lung cancer Father    Heart attack Father        Died at 29   Colon cancer Neg Hx    Esophageal cancer Neg Hx    Rectal cancer Neg Hx    Stomach cancer Neg Hx      Current Outpatient Medications:    amiodarone (PACERONE) 200 MG tablet, Take 400 mg twice a day x 10 days then 400 mg daily x 5 days then 200 mg daily, Disp: 65 tablet, Rfl: 6   benzonatate (TESSALON) 100 MG capsule, Take 1 capsule (100 mg total) by mouth 3 (three) times daily as needed for cough., Disp: 20 capsule, Rfl: 0   cyanocobalamin 1000 MCG tablet, Take 1,000 mcg by mouth daily., Disp: , Rfl:    EPINEPHrine 0.3 mg/0.3 mL IJ SOAJ injection, Inject 0.3 mLs (0.3 mg total) into the muscle as needed for anaphylaxis., Disp: 1 each, Rfl: 1   levothyroxine (SYNTHROID) 50 MCG tablet, TAKE 1  TABLET DAILY (Patient taking differently: Take 50 mcg by mouth at bedtime.), Disp: 90 tablet, Rfl: 3   methocarbamol (ROBAXIN) 500 MG tablet, Take 500 mg by mouth every 6 (six) hours as needed for muscle spasms., Disp: , Rfl:    PERCOCET 5-325 MG tablet, Take 1 tablet by mouth every 6 (six) hours as needed for moderate pain (pain score 4-6)., Disp: , Rfl:    valACYclovir (VALTREX) 1000 MG tablet, ONE TABLET TWICE A DAY X 5 DAYS AS NEEDED FOR OUTBREAK, Disp: 50 tablet, Rfl: 1  Review of Systems:  Negative except as mentioned in HPI.    Physical Exam: Vitals:   05/07/23 1119  BP: 120/68  Pulse: 64   Temp: (!) 97.5 F (36.4 C)  TempSrc: Oral  SpO2: 99%  Weight: 198 lb 3.2 oz (89.9 kg)    Body mass index is 36.25 kg/m.   Physical Exam Vitals reviewed.  Constitutional:      Appearance: Normal appearance. She is obese.  HENT:     Head: Normocephalic and atraumatic.  Eyes:     Conjunctiva/sclera: Conjunctivae normal.  Cardiovascular:     Rate and Rhythm: Normal rate and regular rhythm.  Pulmonary:     Effort: Pulmonary effort is normal.     Breath sounds: Normal breath sounds.  Skin:    General: Skin is warm and dry.  Neurological:     General: No focal deficit present.     Mental Status: She is alert and oriented to person, place, and time.  Psychiatric:        Mood and Affect: Mood normal.        Behavior: Behavior normal.        Thought Content: Thought content normal.        Judgment: Judgment normal.     Impression and Plan:  Hospital discharge follow-up  Solitary pulmonary nodule  Ventricular tachycardia [facility])  [facility] charts reviewed in detail. -Her cardiac MRI showed a solitary left lower lobe pulmonary nodule of 6 mm in size and this will need a 1 year follow-up. -She already has follow-up scheduled with cardiology.  Have advised that she discuss her concerns regarding amiodarone with them.  Medical decision making of moderate complexity was utilized today.    Chaya Jan, MD Faith Alita Chyle

## 2023-05-07 NOTE — Progress Notes (Signed)
 Complex Care Management Note  Care Guide Note 05/07/2023 Name: [name] MRN: [id number] DOB: 09/01/1950  Jaime Fuller is a 73 y.o. year old female who sees Philip Aspen, Limmie Patricia, MD for primary care. I reached out to [name] by phone today to offer complex care management services.  [name] was given information about Complex Care Management services today including:   The Complex Care Management services include support from the care team which includes your Nurse Care Manager, Clinical Social Worker, or Pharmacist.  The Complex Care Management team is here to help remove barriers to the health concerns and goals most important to you. Complex Care Management services are voluntary, and the patient may decline or stop services at any time by request to their care team member.   Complex Care Management Consent Status: Patient agreed to services and verbal consent obtained.   Follow up plan:  Telephone appointment with complex care management team member scheduled for:  05/16/2023  Encounter Outcome:  Patient Scheduled  [name], CMA, Care Guide [facility]  [facility], [facility] Guide Direct Dial: [phone]  Fax: [phone] Website: [email or web address]

## 2023-05-11 ENCOUNTER — Telehealth: Payer: Self-pay

## 2023-05-11 ENCOUNTER — Other Ambulatory Visit: Payer: Self-pay | Admitting: *Deleted

## 2023-05-11 ENCOUNTER — Telehealth: Payer: Self-pay | Admitting: *Deleted

## 2023-05-11 DIAGNOSIS — Z79899 Other long term (current) drug therapy: Secondary | ICD-10-CM | POA: Diagnosis not present

## 2023-05-11 NOTE — Telephone Encounter (Signed)
 Pt was called and advised to follow up with her PCP about pale stools as it may be an primary biliary issue and not due to her medications. Pt verbalized understanding. Pt was offered a lab draw to check liver enzymes, but decided against it and stated she would contact her PCP today.

## 2023-05-11 NOTE — Telephone Encounter (Signed)
-----   Message from Canary Brim sent at 05/11/2023 10:36 AM EST ----- Regarding: RE: Phone notes > can not reply to her appt request. I am not aware of amiodarone causing issues with the color of stool.  However, we do follow liver enzymes when patients are on amiodarone long term.  We can check her LFT's but she definitely needs to follow up with her PCP as it could be a primary biliary issue and not medications.  Thank you,   Can we please put her in for an CMP  Brandi ----- Message ----- From: Erick Alley, RN Sent: 05/11/2023   9:37 AM EST To: Jeanella Craze, NP; Cv Div Pharmd Subject: FW: Phone notes > can not reply to her appt #  Patient is concerned with the pale stools she is having. Pt stated they are the "color of her skin" and that her PCP suggested she contact us about this issue. She is taking the Amiodarone correctly. Pt was told that her concerns would be forwarded to our pharmacy department. ----- Message ----- From: Jeanella Craze, NP Sent: 05/10/2023   5:43 PM EST To: Sharin Grave, RN; Bertram Millard, RN; # Subject: Phone notes > can not reply to her appt requ#  For what ever reason, I can not reply to her patient question.    Please call her and let her know if she is having a change in the color of her stool, she should discuss with her primary.     In regards to her listed amiodarone dosing, please clarify that she was supposed to have been on the following after the hospital.   Amiodarone 400 mg by mouth twice daily x10 days, then decrease to 400 daily for 5 days and then decrease to 200 mg by mouth daily    Canary Brim, NP-C, AGACNP-BC  [facility]  05/10/2023, 5:43 PM

## 2023-05-11 NOTE — Telephone Encounter (Signed)
 For what ever reason, I can not reply to her patient question.    Please call her and let her know if she is having a change in the color of her stool, she should discuss with her primary.    In regards to her listed amiodarone dosing, please clarify that she was supposed to have been on the following after the hospital.   Amiodarone 400 mg by mouth twice daily x10 days, then decrease to 400 daily for 5 days and then decrease to 200 mg by mouth daily   Jaime Brim, NP-C, AGACNP-BC  [facility]  05/10/2023, 5:43 PM   Left message for pt to call back to discuss.  I will also send this information to pt via MyChart.

## 2023-05-11 NOTE — Telephone Encounter (Signed)
 Please see other phone note and MyChart encounter from today for further documentation regarding this issue.

## 2023-05-11 NOTE — Telephone Encounter (Signed)
 Spoke with pt regarding labs. Pt agreed to come in for a CMP and an LFT today per Canary Brim, NP. Labs were ordered and released. Pt verbalized understanding.

## 2023-05-12 LAB — COMPREHENSIVE METABOLIC PANEL
ALT: 25 IU/L (ref 0–32)
AST: 29 IU/L (ref 0–40)
Albumin: 4.5 g/dL (ref 3.8–4.8)
Alkaline Phosphatase: 75 IU/L (ref 44–121)
BUN/Creatinine Ratio: 15 (ref 12–28)
BUN: 18 mg/dL (ref 8–27)
Bilirubin Total: 0.3 mg/dL (ref 0.0–1.2)
CO2: 21 mmol/L (ref 20–29)
Calcium: 9.1 mg/dL (ref 8.7–10.3)
Chloride: 104 mmol/L (ref 96–106)
Creatinine, Ser: 1.2 mg/dL — ABNORMAL HIGH (ref 0.57–1.00)
Globulin, Total: 2.3 g/dL (ref 1.5–4.5)
Glucose: 80 mg/dL (ref 70–99)
Potassium: 4.4 mmol/L (ref 3.5–5.2)
Sodium: 141 mmol/L (ref 134–144)
Total Protein: 6.8 g/dL (ref 6.0–8.5)
eGFR: 48 mL/min/{1.73_m2} — ABNORMAL LOW (ref >59)

## 2023-05-16 ENCOUNTER — Ambulatory Visit: Payer: Self-pay | Admitting: Licensed Clinical Social Worker

## 2023-05-18 NOTE — Patient Outreach (Signed)
 Care Coordination   Initial Visit Note   05/16/2023 Name: Jaime Fuller MRN: [id number] DOB: 1950/03/27  Jaime Fuller is a 73 y.o. year old female who sees Philip Aspen, Limmie Patricia, MD for primary care. I spoke with  Arlis Porta by phone today.  What matters to the patients health and wellness today?  Symptom Management    Goals Addressed             This Visit's Progress    COMPLETED: Symptom Management   On track    Activities and task to complete in order to accomplish goals.   Keep all upcoming appointments discussed today Continue with compliance of taking medication prescribed by Doctor Implement healthy coping skills discussed to assist with management of symptoms         SDOH assessments and interventions completed:  Yes  SDOH Interventions Today    Flowsheet Row Most Recent Value  SDOH Interventions   Food Insecurity Interventions Intervention Not Indicated  Housing Interventions Intervention Not Indicated  Transportation Interventions Intervention Not Indicated  Utilities Interventions Intervention Not Indicated        Care Coordination Interventions:  Yes, provided  Interventions Today    Flowsheet Row Most Recent Value  Chronic Disease   Chronic disease during today's visit Other  [Anxiety]  General Interventions   General Interventions Discussed/Reviewed General Interventions Discussed, Programmer, applications, Doctor Visits  Doctor Visits Discussed/Reviewed Doctor Visits Discussed  Mental Health Interventions   Mental Health Discussed/Reviewed Mental Health Discussed, Coping Strategies, Anxiety  [Pt reports increase in anxiety after recent hospitalization and being placed on a new medication. Since d/c pt's anxiety symptoms have decreased and pt has discussed med concerns with providers. Family resides on pt's property and is a support for her]  Nutrition Interventions   Nutrition Discussed/Reviewed Nutrition Discussed  Pharmacy Interventions    Pharmacy Dicussed/Reviewed Pharmacy Topics Discussed, Medication Adherence  Safety Interventions   Safety Discussed/Reviewed Safety Discussed       Follow up plan: No further intervention required.   Encounter Outcome:  Patient Visit Completed   Jenel Lucks, LCSW [facility]  [facility], [facility] Clinical Social Worker Direct Dial: [phone]  Fax: [phone] Website: [email or web address] 5:04 PM

## 2023-05-18 NOTE — Patient Instructions (Signed)
 Visit Information  Thank you for taking time to visit with me today. Please don't hesitate to contact me if I can be of assistance to you.   Following are the goals we discussed today:   Goals Addressed             This Visit's Progress    COMPLETED: Symptom Management   On track    Activities and task to complete in order to accomplish goals.   Keep all upcoming appointments discussed today Continue with compliance of taking medication prescribed by Doctor Implement healthy coping skills discussed to assist with management of symptoms         Please call the care guide team at [phone] if you need to cancel or reschedule your appointment.   If you are experiencing a Mental Health or Behavioral Health Crisis or need someone to talk to, please call the Suicide and Crisis Lifeline: 988 call 911   Patient verbalizes understanding of instructions and care plan provided today and agrees to view in MyChart. Active MyChart status and patient understanding of how to access instructions and care plan via MyChart confirmed with patient.     Windy Fast [facility]  [facility], [facility] Clinical Social Worker Direct Dial: [phone]  Fax: [phone] Website: [email or web address] 5:05 PM

## 2023-05-23 ENCOUNTER — Ambulatory Visit: Payer: Medicare Other | Admitting: Internal Medicine

## 2023-05-23 ENCOUNTER — Encounter: Payer: Self-pay | Admitting: Internal Medicine

## 2023-05-23 VITALS — BP 110/80 | HR 60 | Temp 97.7°F | Ht 61.0 in | Wt 195.3 lb

## 2023-05-23 DIAGNOSIS — E559 Vitamin D deficiency, unspecified: Secondary | ICD-10-CM | POA: Diagnosis not present

## 2023-05-23 DIAGNOSIS — Z780 Asymptomatic menopausal state: Secondary | ICD-10-CM

## 2023-05-23 DIAGNOSIS — E039 Hypothyroidism, unspecified: Secondary | ICD-10-CM | POA: Diagnosis not present

## 2023-05-23 DIAGNOSIS — Z1159 Encounter for screening for other viral diseases: Secondary | ICD-10-CM | POA: Diagnosis not present

## 2023-05-23 DIAGNOSIS — D510 Vitamin B12 deficiency anemia due to intrinsic factor deficiency: Secondary | ICD-10-CM

## 2023-05-23 DIAGNOSIS — E042 Nontoxic multinodular goiter: Secondary | ICD-10-CM | POA: Diagnosis not present

## 2023-05-23 DIAGNOSIS — E538 Deficiency of other specified B group vitamins: Secondary | ICD-10-CM

## 2023-05-23 DIAGNOSIS — Z1231 Encounter for screening mammogram for malignant neoplasm of breast: Secondary | ICD-10-CM | POA: Diagnosis not present

## 2023-05-23 DIAGNOSIS — Z0000 Encounter for general adult medical examination without abnormal findings: Secondary | ICD-10-CM | POA: Diagnosis not present

## 2023-05-23 LAB — CBC WITH DIFFERENTIAL/PLATELET
Basophils Absolute: 0 10*3/uL (ref 0.0–0.1)
Basophils Relative: 0.9 % (ref 0.0–3.0)
Eosinophils Absolute: 0.1 10*3/uL (ref 0.0–0.7)
Eosinophils Relative: 2.5 % (ref 0.0–5.0)
HCT: 42.2 % (ref 36.0–46.0)
Hemoglobin: 13.9 g/dL (ref 12.0–15.0)
Lymphocytes Relative: 38.3 % (ref 12.0–46.0)
Lymphs Abs: 1.6 10*3/uL (ref 0.7–4.0)
MCHC: 33 g/dL (ref 30.0–36.0)
MCV: 87.3 fl (ref 78.0–100.0)
Monocytes Absolute: 0.3 10*3/uL (ref 0.1–1.0)
Monocytes Relative: 8.2 % (ref 3.0–12.0)
Neutro Abs: 2 10*3/uL (ref 1.4–7.7)
Neutrophils Relative %: 50.1 % (ref 43.0–77.0)
Platelets: 233 10*3/uL (ref 150.0–400.0)
RBC: 4.83 Mil/uL (ref 3.87–5.11)
RDW: 15.2 % (ref 11.5–15.5)
WBC: 4.1 10*3/uL (ref 4.0–10.5)

## 2023-05-23 LAB — COMPREHENSIVE METABOLIC PANEL
ALT: 22 U/L (ref 0–35)
AST: 24 U/L (ref 0–37)
Albumin: 4.4 g/dL (ref 3.5–5.2)
Alkaline Phosphatase: 59 U/L (ref 39–117)
BUN: 17 mg/dL (ref 6–23)
CO2: 29 meq/L (ref 19–32)
Calcium: 9.4 mg/dL (ref 8.4–10.5)
Chloride: 103 meq/L (ref 96–112)
Creatinine, Ser: 1.01 mg/dL (ref 0.40–1.20)
GFR: 56 mL/min — ABNORMAL LOW (ref >60.00)
Glucose, Bld: 93 mg/dL (ref 70–99)
Potassium: 4.6 meq/L (ref 3.5–5.1)
Sodium: 140 meq/L (ref 135–145)
Total Bilirubin: 0.5 mg/dL (ref 0.2–1.2)
Total Protein: 7.2 g/dL (ref 6.0–8.3)

## 2023-05-23 LAB — LIPID PANEL
Cholesterol: 141 mg/dL (ref 0–200)
HDL: 63.3 mg/dL (ref >39.00)
LDL Cholesterol: 69 mg/dL (ref 0–99)
NonHDL: 77.94
Total CHOL/HDL Ratio: 2
Triglycerides: 45 mg/dL (ref 0.0–149.0)
VLDL: 9 mg/dL (ref 0.0–40.0)

## 2023-05-23 LAB — VITAMIN B12: Vitamin B-12: >1537 pg/mL — ABNORMAL HIGH (ref 211–911)

## 2023-05-23 LAB — VITAMIN D 25 HYDROXY (VIT D DEFICIENCY, FRACTURES): VITD: 26.93 ng/mL — ABNORMAL LOW (ref 30.00–100.00)

## 2023-05-23 LAB — TSH: TSH: 3.55 u[IU]/mL (ref 0.35–5.50)

## 2023-05-23 MED ORDER — LEVOTHYROXINE SODIUM 50 MCG PO TABS: 50.0000 ug | ORAL_TABLET | Freq: Every day | ORAL | 1 refills | Status: DC

## 2023-05-23 NOTE — Progress Notes (Signed)
 Established Patient Office Visit     CC/Reason for Visit: Subsequent Medicare wellness visit and follow-up chronic conditions  HPI: Jaime Fuller is a 73 y.o. female who is coming in today for the above mentioned reasons. Past Medical History is significant for: Hypothyroidism with a history of multinodular goiter now overdue for annual nodule follow-up, vitamin B12 and vitamin D deficiencies, a recent hospitalization for nonsustained ventricular tachycardia now on amiodarone followed by cardiology.  Has routine eye and dental care.  All immunizations are up-to-date.  She is due for mammogram and bone density.   Past Medical/Surgical History: Past Medical History:  Diagnosis Date   Anxiety state, unspecified 09/20/2007   Arthritis    Cataract    Chronic kidney disease    kidney stones   GERD (gastroesophageal reflux disease)    They say so   HYPOTHYROIDISM 03/08/2007   no meds needed now 04-15-19   Pancreatitis    secondary to ERCP    Past Surgical History:  Procedure Laterality Date   abdominal plasty     Carpel tunnel surgery left hand     Childbirth x 2     CHOLECYSTECTOMY     GASTRIC BYPASS     LEFT HEART CATH AND CORONARY ANGIOGRAPHY N/A 04/25/2023   Procedure: LEFT HEART CATH AND CORONARY ANGIOGRAPHY;  Surgeon: Tonny Bollman, MD;  Location: [facility] INVASIVE CV LAB;  Service: Cardiovascular;  Laterality: N/A;   UPPER GASTROINTESTINAL ENDOSCOPY     WISDOM TOOTH EXTRACTION      Social History:  reports that she quit smoking about 45 years ago. Her smoking use included cigarettes. She has a 2.5 pack-year smoking history. She has been exposed to tobacco smoke. She has never used smokeless tobacco. She reports that she does not drink alcohol and does not use drugs.  Allergies: Allergies  Allergen Reactions   Bee Venom Anaphylaxis   Vicodin [Hydrocodone-Acetaminophen] Itching    Family History:  Family History  Problem Relation Age of Onset   Thyroid disease  Mother    Lung cancer Father    Heart attack Father        Died at 86   Cancer Father    Kidney disease Father    Colon cancer Neg Hx    Esophageal cancer Neg Hx    Rectal cancer Neg Hx    Stomach cancer Neg Hx      Current Outpatient Medications:    amiodarone (PACERONE) 200 MG tablet, Take 400 mg twice a day x 10 days then 400 mg daily x 5 days then 200 mg daily, Disp: 65 tablet, Rfl: 6   benzonatate (TESSALON) 100 MG capsule, Take 1 capsule (100 mg total) by mouth 3 (three) times daily as needed for cough., Disp: 20 capsule, Rfl: 0   cyanocobalamin 1000 MCG tablet, Take 1,000 mcg by mouth daily., Disp: , Rfl:    EPINEPHrine 0.3 mg/0.3 mL IJ SOAJ injection, Inject 0.3 mLs (0.3 mg total) into the muscle as needed for anaphylaxis., Disp: 1 each, Rfl: 1   levothyroxine (SYNTHROID) 50 MCG tablet, TAKE 1 TABLET DAILY (Patient taking differently: Take 50 mcg by mouth at bedtime.), Disp: 90 tablet, Rfl: 3   methocarbamol (ROBAXIN) 500 MG tablet, Take 500 mg by mouth every 6 (six) hours as needed for muscle spasms., Disp: , Rfl:    PERCOCET 5-325 MG tablet, Take 1 tablet by mouth every 6 (six) hours as needed for moderate pain (pain score 4-6)., Disp: , Rfl:  valACYclovir (VALTREX) 1000 MG tablet, ONE TABLET TWICE A DAY X 5 DAYS AS NEEDED FOR OUTBREAK, Disp: 50 tablet, Rfl: 1  Review of Systems:  Negative unless indicated in HPI.   Physical Exam: Vitals:   05/23/23 0928  BP: 110/80  Pulse: 60  Temp: 97.7 F (36.5 C)  TempSrc: Oral  SpO2: 99%  Weight: 195 lb 4.8 oz (88.6 kg)  Height: 5\' 1"  (1.549 m)    Body mass index is 36.9 kg/m.   Physical Exam Vitals reviewed.  Constitutional:      General: She is not in acute distress.    Appearance: Normal appearance. She is obese. She is not ill-appearing, toxic-appearing or diaphoretic.  HENT:     Head: Normocephalic.     Right Ear: Tympanic membrane, ear canal and external ear normal. There is no impacted cerumen.     Left Ear:  Tympanic membrane, ear canal and external ear normal. There is no impacted cerumen.     Nose: Nose normal.     Mouth/Throat:     Mouth: Mucous membranes are moist.     Pharynx: Oropharynx is clear. No oropharyngeal exudate or posterior oropharyngeal erythema.  Eyes:     General: No scleral icterus.       Right eye: No discharge.        Left eye: No discharge.     Conjunctiva/sclera: Conjunctivae normal.     Pupils: Pupils are equal, round, and reactive to light.  Neck:     Vascular: No carotid bruit.  Cardiovascular:     Rate and Rhythm: Normal rate and regular rhythm.     Pulses: Normal pulses.     Heart sounds: Normal heart sounds.  Pulmonary:     Effort: Pulmonary effort is normal. No respiratory distress.     Breath sounds: Normal breath sounds.  Abdominal:     General: Abdomen is flat. Bowel sounds are normal.     Palpations: Abdomen is soft.  Musculoskeletal:        General: Normal range of motion.     Cervical back: Normal range of motion.  Skin:    General: Skin is warm and dry.  Neurological:     General: No focal deficit present.     Mental Status: She is alert and oriented to person, place, and time. Mental status is at baseline.  Psychiatric:        Mood and Affect: Mood normal.        Behavior: Behavior normal.        Thought Content: Thought content normal.        Judgment: Judgment normal.   Subsequent Medicare wellness visit   1. Risk factors, based on past  M,S,F - Cardiac Risk Factors include: advanced age (>69men, >60 women)   2.  Physical activities: Dietary issues and exercise activities discussed:      3.  Depression/mood:  Flowsheet Row Office Visit from [facility]15/[facility] in [facility] Total Score 4        4.  ADL's:    05/23/2023    9:21 AM 04/25/2023    5:05 PM  In your present state of health, do you have any difficulty performing the following activities:  Hearing? 0   Vision? 0   Difficulty  concentrating or making decisions? 0   Walking or climbing stairs? 1   Dressing or bathing? 0   Doing errands, shopping? 0 0  Preparing Food and eating ? N  Using the Toilet? N   In the past six months, have you accidently leaked urine? Y   Do you have problems with loss of bowel control? Y   Managing your Medications? N   Managing your Finances? N   Housekeeping or managing your Housekeeping? N      5.  Fall risk:     03/16/2021    7:02 AM 03/16/2021    7:46 AM 06/15/2021    9:20 AM 05/07/2023   11:19 AM 05/23/2023    9:24 AM  Fall Risk  Falls in the past year? 0 0 0 0 0  Was there an injury with Fall?  0 0 0 0  Fall Risk Category Calculator  0 0 0 0  Fall Risk Category (Retired)  Low Low    (RETIRED) Patient Fall Risk Level Low fall risk Low fall risk Low fall risk    Patient at Risk for Falls Due to  No Fall Risks No Fall Risks    Fall risk Follow up  Falls evaluation completed Falls evaluation completed Falls evaluation completed Falls evaluation completed     6.  Home safety: No problems identified   7.  Height weight, and visual acuity: height and weight as above, vision/hearing: Vision Screening   Right eye Left eye Both eyes  Without correction     With correction 20/50 20/40 20/32      8.  Counseling: Counseling given: Not Answered    9. Lab orders based on risk factors: Laboratory update will be reviewed   10. Cognitive assessment:        05/23/2023    9:25 AM  6CIT Screen  What Year? 0 points  What month? 0 points  What time? 0 points  Count back from 20 0 points  Months in reverse 0 points  Repeat phrase 0 points  Total Score 0 points     11. Screening: Patient provided with a written and personalized 5-10 year screening schedule in the AVS. Health Maintenance  Topic Date Due   Hepatitis C Screening  Never done   Mammogram  06/03/2022   COVID-19 Vaccine (4 - 2024-25 season) 11/12/2022   Medicare Annual Wellness Visit  05/22/2024   Colon Cancer  Screening  05/22/2029   DTaP/Tdap/Td vaccine (4 - Td or Tdap) 11/30/2031   Pneumonia Vaccine  Completed   Flu Shot  Completed   DEXA scan (bone density measurement)  Completed   Zoster (Shingles) Vaccine  Completed   HPV Vaccine  Aged Out    12. Provider List Update: Patient Care Team    Relationship Specialty Notifications Start End  Philip Aspen, Limmie Patricia, MD PCP - General Internal Medicine  01/01/19   Kathleene Hazel, MD PCP - Cardiology Cardiology  04/25/23      13. Advance Directives: Does Patient Have a Medical Advance Directive?: Yes Type of Advance Directive: Healthcare Power of Attorney, Living will, Out of facility DNR (pink MOST or yellow form) Does patient want to make changes to medical advance directive?: No - Patient declined Copy of Healthcare Power of Attorney in Chart?: No - copy requested  14. Opioids: Patient is not on any opioid prescriptions and has no risk factors for a substance use disorder.   15.   Goals      Weight (lb) < 200 lb (90.7 kg)         I have personally reviewed and noted the following in the patient's chart:   Medical and social history Use  of alcohol, tobacco or illicit drugs  Current medications and supplements Functional ability and status Nutritional status Physical activity Advanced directives List of other physicians Hospitalizations, surgeries, and ER visits in previous 12 months Vitals Screenings to include cognitive, depression, and falls Referrals and appointments  In addition, I have reviewed and discussed with patient certain preventive protocols, quality metrics, and best practice recommendations. A written personalized care plan for preventive services as well as general preventive health recommendations were provided to patient.    Impression and Plan:  Medicare annual wellness visit, subsequent  Hypothyroidism, unspecified type -     TSH; Future  Pernicious anemia -     Vitamin B12;  Future  Vitamin D deficiency -     VITAMIN D 25 Hydroxy (Vit-D Deficiency, Fractures); Future  Encounter for screening mammogram for malignant neoplasm of breast -     Digital Screening Mammogram, Left and Right; Future  Postmenopausal estrogen deficiency  Multinodular goiter -     US THYROID; Future  Encounter for hepatitis C screening test for low risk patient -     Hepatitis C antibody; Future  B12 deficiency  Morbid obesity (HCC) -     CBC with Differential/Platelet; Future -     Comprehensive metabolic panel; Future -     Lipid panel; Future   -Recommend routine eye and dental care. -Healthy lifestyle discussed in detail. -Labs to be updated today. -Prostate cancer screening: N/A Health Maintenance  Topic Date Due   Hepatitis C Screening  Never done   Mammogram  06/03/2022   COVID-19 Vaccine (4 - 2024-25 season) 11/12/2022   Medicare Annual Wellness Visit  05/22/2024   Colon Cancer Screening  05/22/2029   DTaP/Tdap/Td vaccine (4 - Td or Tdap) 11/30/2031   Pneumonia Vaccine  Completed   Flu Shot  Completed   DEXA scan (bone density measurement)  Completed   Zoster (Shingles) Vaccine  Completed   HPV Vaccine  Aged Out     -All immunizations are up-to-date. -Sent for mammogram and DEXA. -She is due for her annual follow-up thyroid nodules.  Will order thyroid ultrasound.    Chaya Jan, MD [facility]

## 2023-05-24 ENCOUNTER — Ambulatory Visit (HOSPITAL_BASED_OUTPATIENT_CLINIC_OR_DEPARTMENT_OTHER)
Admission: RE | Admit: 2023-05-24 | Discharge: 2023-05-24 | Disposition: A | Discharge status: Home / Self Care | Source: Ambulatory Visit | Attending: Internal Medicine | Admitting: Internal Medicine

## 2023-05-24 ENCOUNTER — Other Ambulatory Visit (HOSPITAL_COMMUNITY): Payer: Self-pay

## 2023-05-24 DIAGNOSIS — E042 Nontoxic multinodular goiter: Secondary | ICD-10-CM | POA: Diagnosis not present

## 2023-05-24 LAB — HEPATITIS C ANTIBODY: Hepatitis C Ab: NONREACTIVE

## 2023-05-28 ENCOUNTER — Encounter: Payer: Self-pay | Admitting: Internal Medicine

## 2023-05-28 ENCOUNTER — Other Ambulatory Visit: Payer: Self-pay | Admitting: Internal Medicine

## 2023-05-28 DIAGNOSIS — E559 Vitamin D deficiency, unspecified: Secondary | ICD-10-CM

## 2023-05-28 MED ORDER — VITAMIN D (ERGOCALCIFEROL) 1.25 MG (50000 UNIT) PO CAPS: 50000.0000 [IU] | ORAL_CAPSULE | ORAL | 0 refills | Status: AC

## 2023-05-30 ENCOUNTER — Other Ambulatory Visit: Payer: Self-pay | Admitting: Internal Medicine

## 2023-05-30 DIAGNOSIS — K219 Gastro-esophageal reflux disease without esophagitis: Secondary | ICD-10-CM

## 2023-05-30 MED ORDER — PANTOPRAZOLE SODIUM 40 MG PO TBEC: 40.0000 mg | DELAYED_RELEASE_TABLET | Freq: Every day | ORAL | 1 refills | Status: DC

## 2023-06-06 ENCOUNTER — Encounter: Payer: Self-pay | Admitting: Internal Medicine

## 2023-06-14 NOTE — Progress Notes (Signed)
  Electrophysiology Office Note:   Date:  06/19/2023  ID:  Jaime Fuller, DOB Feb 14, 1951, MRN [id number]  Primary Cardiologist: Verne Carrow, MD Primary Heart Failure: None Electrophysiologist: None      History of Present Illness:   Jaime Fuller is a 73 y.o. female with h/o NSTEMI, VT (in setting of COVID), PVC's, hypothyroidism, IDA, [place]en-Y gastric bypass (2004) seen today for routine electrophysiology followup.   Patient was admitted 2/11 - 04/30/23 for chest pain and shortness of breath. She was COVID positive on admit.  EKG showed PVC's / NSVT, bradycardia. She was taken to the cath lab with normal coronaries. ECHO showed LVEF 50-55% with regional wall motion abnormalities. She was placed on isoproterenol & lidocaine gtt.  She was transitioned to oral amiodarone. cMRI was not revealing / no LGE.  No clear etiology for ventricular arrhythmias.   Since last being seen she reports she has had a lot of reflux symptoms.  She reports she has drastically changed her diet for the better.  She reports she was drinking 6+ diet Dr. Pat Kocher before the hospitalization & has stopped drinking caffeine.   she denies chest pain, palpitations, dyspnea, PND, orthopnea, nausea, vomiting, dizziness, syncope, edema, weight gain, or early satiety.   Review of systems complete and found to be negative unless listed in HPI.   EP Information / Studies Reviewed:    EKG is ordered today. Personal review as below. NSR 60 bpm, no PVC's on EKG (test locked, unable to sign)      Studies:  ECHO 04/2023 > LVEF 50-55%, G1DD LHC 04/25/23 > widely patent coronary arteries with no obstructive CAD, low normal LVEF 50-55% with focal akinesis of the mid inferior wall with appearance of a focal inferior wall aneurysm cMRI 04/27/23 > difficult study due to frequent PVC's, LVEF 52%, no myocardial LGE, no evidence of prior MI, infiltrative disease or myocarditis   Arrhythmia / AAD PVC's / NSVT            Physical  Exam:   VS:  BP 126/61 (BP Location: Left Arm, Patient Position: Sitting, Cuff Size: Large)   Pulse (!) 57   Resp 16   Ht 5\' 1"  (1.549 m)   Wt 199 lb 3.2 oz (90.4 kg)   SpO2 96%   BMI 37.64 kg/m    Wt Readings from Last 3 Encounters:  06/19/23 199 lb 3.2 oz (90.4 kg)  05/23/23 195 lb 4.8 oz (88.6 kg)  05/07/23 198 lb 3.2 oz (89.9 kg)     GEN: Well nourished, well developed in no acute distress NECK: No JVD; No carotid bruits CARDIAC: Regular rate and rhythm, no murmurs, rubs, gallops RESPIRATORY:  Clear to auscultation without rales, wheezing or rhonchi  ABDOMEN: Soft, non-tender, non-distended EXTREMITIES:  trace LE pitting edema; No deformity   ASSESSMENT AND PLAN:    PVC's / NSVT  High Risk Drug Monitoring: Amiodarone  No clear cause but was in setting of COVID infection. Negative LHC, LVEF 50-55%, negative cMRI.  -improved during hospital admit  -continue amiodarone -limited options for treatment of PVC's given baseline bradycardia -amiodarone labs recently completed and WNL  -will review with Dr. Lalla Brothers re: ablation or further testing   Follow up with Dr. Lalla Brothers  4 months   Signed, Canary Brim, NP-C, AGACNP-BC [facility] - Electrophysiology  06/19/2023, 1:15 PM

## 2023-06-19 ENCOUNTER — Encounter: Payer: Self-pay | Admitting: Pulmonary Disease

## 2023-06-19 ENCOUNTER — Ambulatory Visit: Payer: Medicare Other | Attending: Pulmonary Disease | Admitting: Pulmonary Disease

## 2023-06-19 VITALS — BP 126/61 | HR 57 | Resp 16 | Ht 61.0 in | Wt 199.2 lb

## 2023-06-19 DIAGNOSIS — I493 Ventricular premature depolarization: Secondary | ICD-10-CM | POA: Insufficient documentation

## 2023-06-19 DIAGNOSIS — Z79899 Other long term (current) drug therapy: Secondary | ICD-10-CM | POA: Diagnosis not present

## 2023-06-19 NOTE — Patient Instructions (Signed)
 Medication Instructions:  Your physician recommends that you continue on your current medications as directed. Please refer to the Current Medication list given to you today.  *If you need a refill on your cardiac medications before your next appointment, please call your pharmacy*  Lab Work: None  If you have labs (blood work) drawn today and your tests are completely normal, you will receive your results only by: MyChart Message (if you have MyChart) OR A paper copy in the mail If you have any lab test that is abnormal or we need to change your treatment, we will call you to review the results.  Follow-Up: At [facility], you and your health needs are our priority.  As part of our continuing mission to provide you with exceptional heart care, our providers are all part of one team.  This team includes your primary Cardiologist (physician) and Advanced Practice Providers or APPs (Physician Assistants and Nurse Practitioners) who all work together to provide you with the care you need, when you need it.  Your next appointment:   4 month(s)  Provider:   Steffanie Dunn, MD       1st Floor: - Lobby - Registration  - Pharmacy  - Lab - Cafe  2nd Floor: - PV Lab - Diagnostic Testing (echo, CT, nuclear med)  3rd Floor: - Vacant  4th Floor: - TCTS (cardiothoracic surgery) - AFib Clinic - Structural Heart Clinic - Vascular Surgery  - Vascular Ultrasound  5th Floor: - HeartCare Cardiology (general and EP) - Clinical Pharmacy for coumadin, hypertension, lipid, weight-loss medications, and med management appointments    Valet parking services will be available as well.

## 2023-06-20 ENCOUNTER — Other Ambulatory Visit: Payer: Self-pay | Admitting: *Deleted

## 2023-06-20 DIAGNOSIS — K219 Gastro-esophageal reflux disease without esophagitis: Secondary | ICD-10-CM

## 2023-06-20 MED ORDER — PANTOPRAZOLE SODIUM 40 MG PO TBEC: 40.0000 mg | DELAYED_RELEASE_TABLET | Freq: Every day | ORAL | 1 refills | Status: DC

## 2023-06-26 ENCOUNTER — Other Ambulatory Visit: Payer: Self-pay

## 2023-06-26 MED ORDER — AMIODARONE HCL 200 MG PO TABS: ORAL_TABLET | ORAL | 3 refills | Status: DC

## 2023-06-27 ENCOUNTER — Encounter: Payer: Self-pay | Admitting: Cardiology

## 2023-06-27 ENCOUNTER — Other Ambulatory Visit (HOSPITAL_COMMUNITY): Payer: Self-pay | Admitting: Adult Health

## 2023-06-27 ENCOUNTER — Other Ambulatory Visit: Payer: Self-pay

## 2023-06-27 ENCOUNTER — Telehealth: Payer: Self-pay | Admitting: Pulmonary Disease

## 2023-06-27 MED ORDER — AMIODARONE HCL 200 MG PO TABS
200.0000 mg | ORAL_TABLET | Freq: Every day | ORAL | 3 refills | Status: DC
Start: 2023-06-27 — End: 2023-11-02

## 2023-06-27 MED ORDER — AMIODARONE HCL 200 MG PO TABS: 200.0000 mg | ORAL_TABLET | Freq: Every day | ORAL | 3 refills | Status: DC

## 2023-06-27 MED ORDER — AMIODARONE HCL 200 MG PO TABS: 200.0000 mg | ORAL_TABLET | Freq: Every day | ORAL | 0 refills | Status: DC

## 2023-06-27 NOTE — Telephone Encounter (Signed)
 Pt's medication was sent to pt's pharmacy as requested. Confirmation received.

## 2023-06-27 NOTE — Addendum Note (Signed)
 Addended by: Gayleen Kawasaki D on: 06/27/2023 10:41 AM   Modules accepted: Orders

## 2023-06-27 NOTE — Telephone Encounter (Signed)
 Error

## 2023-06-27 NOTE — Telephone Encounter (Signed)
*  STAT* If patient is at the pharmacy, call can be transferred to refill team.   1. Which medications need to be refilled? (please list name of each medication and dose if known)   amiodarone (PACERONE) 200 MG tablet     4. Which pharmacy/location (including street and city if local pharmacy) is medication to be sent to? [id number] Jonette Nestle, [place] - [address] AT [facility] Phone: [phone]  Fax: [phone]       5. Do they need a 30 day or 90 day supply? 14 day supply, mail order will not be in on time, pt has one day left.

## 2023-07-06 ENCOUNTER — Ambulatory Visit
Admission: RE | Admit: 2023-07-06 | Discharge: 2023-07-06 | Disposition: A | Discharge status: Home / Self Care | Source: Ambulatory Visit | Attending: Internal Medicine | Admitting: Internal Medicine

## 2023-07-06 DIAGNOSIS — Z1231 Encounter for screening mammogram for malignant neoplasm of breast: Secondary | ICD-10-CM

## 2023-07-16 ENCOUNTER — Telehealth: Payer: Self-pay | Admitting: Pulmonary Disease

## 2023-07-16 NOTE — Telephone Encounter (Signed)
 Patient's plan of care reviewed with Dr. Marven Slimmer, will stop amiodarone  for trial off and monitor for recurrent PVC's.  Plan of care discussed with patient.  She notes working outside and has been having sun burns > reviewed that amiodarone  can make the skin sensitive to sun, she should wear long sleeves, pants, hat & gloves when outside.  Encouraged her to call the clinic if new or worsening symptoms.  Discussed amiodarone  is a long acting agent and may take a month to get out of her system. Patient indicates understanding.    Creighton Doffing, NP-C, AGACNP-BC [facility] - Electrophysiology  07/16/2023, 8:32 AM

## 2023-09-03 ENCOUNTER — Other Ambulatory Visit (INDEPENDENT_AMBULATORY_CARE_PROVIDER_SITE_OTHER)

## 2023-09-03 DIAGNOSIS — E559 Vitamin D deficiency, unspecified: Secondary | ICD-10-CM

## 2023-09-03 LAB — VITAMIN D 25 HYDROXY (VIT D DEFICIENCY, FRACTURES): VITD: 44.14 ng/mL (ref 30.00–100.00)

## 2023-09-04 ENCOUNTER — Ambulatory Visit: Payer: Self-pay | Admitting: Internal Medicine

## 2023-09-13 DIAGNOSIS — M5135 Other intervertebral disc degeneration, thoracolumbar region: Secondary | ICD-10-CM | POA: Diagnosis not present

## 2023-09-13 DIAGNOSIS — M4726 Other spondylosis with radiculopathy, lumbar region: Secondary | ICD-10-CM | POA: Diagnosis not present

## 2023-09-13 DIAGNOSIS — M47816 Spondylosis without myelopathy or radiculopathy, lumbar region: Secondary | ICD-10-CM | POA: Diagnosis not present

## 2023-09-13 DIAGNOSIS — M48061 Spinal stenosis, lumbar region without neurogenic claudication: Secondary | ICD-10-CM | POA: Diagnosis not present

## 2023-09-13 DIAGNOSIS — R29898 Other symptoms and signs involving the musculoskeletal system: Secondary | ICD-10-CM | POA: Diagnosis not present

## 2023-09-20 DIAGNOSIS — M4726 Other spondylosis with radiculopathy, lumbar region: Secondary | ICD-10-CM | POA: Diagnosis not present

## 2023-09-20 DIAGNOSIS — Z6834 Body mass index (BMI) 34.0-34.9, adult: Secondary | ICD-10-CM | POA: Diagnosis not present

## 2023-10-09 DIAGNOSIS — M5416 Radiculopathy, lumbar region: Secondary | ICD-10-CM | POA: Diagnosis not present

## 2023-10-09 DIAGNOSIS — M5116 Intervertebral disc disorders with radiculopathy, lumbar region: Secondary | ICD-10-CM | POA: Diagnosis not present

## 2023-10-15 ENCOUNTER — Emergency Department (HOSPITAL_COMMUNITY): Admission: EM | Admit: 2023-10-15 | Discharge: 2023-10-15 | Disposition: A | Discharge status: Home / Self Care

## 2023-10-15 ENCOUNTER — Emergency Department (HOSPITAL_COMMUNITY)

## 2023-10-15 ENCOUNTER — Telehealth: Payer: Self-pay | Admitting: Cardiology

## 2023-10-15 ENCOUNTER — Emergency Department (INDEPENDENT_AMBULATORY_CARE_PROVIDER_SITE_OTHER)

## 2023-10-15 ENCOUNTER — Telehealth: Payer: Self-pay | Admitting: Cardiovascular Disease

## 2023-10-15 ENCOUNTER — Encounter (HOSPITAL_COMMUNITY): Payer: Self-pay

## 2023-10-15 ENCOUNTER — Other Ambulatory Visit: Payer: Self-pay

## 2023-10-15 DIAGNOSIS — R0602 Shortness of breath: Secondary | ICD-10-CM | POA: Diagnosis not present

## 2023-10-15 DIAGNOSIS — I471 Supraventricular tachycardia, unspecified: Secondary | ICD-10-CM | POA: Diagnosis not present

## 2023-10-15 DIAGNOSIS — I493 Ventricular premature depolarization: Secondary | ICD-10-CM

## 2023-10-15 DIAGNOSIS — R001 Bradycardia, unspecified: Secondary | ICD-10-CM | POA: Diagnosis not present

## 2023-10-15 DIAGNOSIS — Z8616 Personal history of COVID-19: Secondary | ICD-10-CM | POA: Diagnosis not present

## 2023-10-15 DIAGNOSIS — I498 Other specified cardiac arrhythmias: Secondary | ICD-10-CM | POA: Diagnosis not present

## 2023-10-15 DIAGNOSIS — E039 Hypothyroidism, unspecified: Secondary | ICD-10-CM | POA: Diagnosis not present

## 2023-10-15 DIAGNOSIS — D72829 Elevated white blood cell count, unspecified: Secondary | ICD-10-CM | POA: Diagnosis not present

## 2023-10-15 DIAGNOSIS — I499 Cardiac arrhythmia, unspecified: Secondary | ICD-10-CM | POA: Diagnosis not present

## 2023-10-15 LAB — BASIC METABOLIC PANEL WITH GFR
Anion gap: 10 (ref 5–15)
BUN: 25 mg/dL — ABNORMAL HIGH (ref 8–23)
CO2: 20 mmol/L — ABNORMAL LOW (ref 22–32)
Calcium: 9 mg/dL (ref 8.9–10.3)
Chloride: 110 mmol/L (ref 98–111)
Creatinine, Ser: 1.24 mg/dL — ABNORMAL HIGH (ref 0.44–1.00)
GFR, Estimated: 47 mL/min — ABNORMAL LOW (ref >60)
Glucose, Bld: 112 mg/dL — ABNORMAL HIGH (ref 70–99)
Potassium: 4.2 mmol/L (ref 3.5–5.1)
Sodium: 140 mmol/L (ref 135–145)

## 2023-10-15 LAB — CBC
HCT: 44.8 % (ref 36.0–46.0)
Hemoglobin: 14.4 g/dL (ref 12.0–15.0)
MCH: 29.1 pg (ref 26.0–34.0)
MCHC: 32.1 g/dL (ref 30.0–36.0)
MCV: 90.7 fL (ref 80.0–100.0)
Platelets: 309 K/uL (ref 150–400)
RBC: 4.94 MIL/uL (ref 3.87–5.11)
RDW: 13.2 % (ref 11.5–15.5)
WBC: 11.3 K/uL — ABNORMAL HIGH (ref 4.0–10.5)
nRBC: 0 % (ref 0.0–0.2)

## 2023-10-15 LAB — TROPONIN I (HIGH SENSITIVITY)
Troponin I (High Sensitivity): 4 ng/L (ref <18)
Troponin I (High Sensitivity): 4 ng/L (ref <18)

## 2023-10-15 LAB — MAGNESIUM: Magnesium: 2.2 mg/dL (ref 1.7–2.4)

## 2023-10-15 LAB — TSH: TSH: 1.377 u[IU]/mL (ref 0.350–4.500)

## 2023-10-15 MED ORDER — METOPROLOL TARTRATE 50 MG PO TABS: 25.0000 mg | ORAL_TABLET | Freq: Two times a day (BID) | ORAL | 1 refills | Status: DC

## 2023-10-15 MED ORDER — MEXILETINE HCL 150 MG PO CAPS
150.0000 mg | ORAL_CAPSULE | Freq: Three times a day (TID) | ORAL | Status: DC
  Filled 2023-10-15 (×2): qty 1

## 2023-10-15 MED ORDER — MEXILETINE HCL 150 MG PO CAPS: 150.0000 mg | ORAL_CAPSULE | Freq: Three times a day (TID) | ORAL | 1 refills | Status: DC

## 2023-10-15 MED ORDER — METOPROLOL TARTRATE 25 MG PO TABS
25.0000 mg | ORAL_TABLET | Freq: Two times a day (BID) | ORAL | Status: DC
  Filled 2023-10-15: qty 1

## 2023-10-15 NOTE — Telephone Encounter (Signed)
  Pt c/o of Chest Pain: STAT if active CP, including tightness, pressure, jaw pain, radiating pain to shoulder/upper arm/back, CP unrelieved by Nitro. Symptoms reported of SOB, nausea, vomiting, sweating.  1. Are you having CP right now?   A little chest pressure), back pain, jaw pain  2. Are you experiencing any other symptoms (ex. SOB, nausea, vomiting, sweating)?   Night sweats last night  3. Is your CP continuous or coming and going?   Coming and going  4. Have you taken Nitroglycerin ?   No  5. How long have you been experiencing CP?  Patient stated she had a steriod injection about a week ago  6. If NO CP at time of call then end call with telling Pt to call back or call 911 if Chest pain returns prior to return call from triage team.   Patient stated she has been having some chest pressure.  Patient noted she has been having low HR readings (around 30-33) with high BP spiking to 100-113.  Patient stated she has been having a flutter in her chest.  Patient also stated she has been fatigue and noted she has been having PVC.

## 2023-10-15 NOTE — ED Notes (Signed)
 CCMD called.

## 2023-10-15 NOTE — ED Triage Notes (Signed)
 Pt sent by MD for further evaluation of PVCs and bradycardia; pt endorses chest tightness, sob, diaphoresis, and HA (HA since last Thursday after steroid injection)

## 2023-10-15 NOTE — Discharge Instructions (Addendum)
 started you on 2 new medications.  Please take this prescribed.  If you have any kind of worsening chest pain or shortness of breath and please come to the ED for further evaluation.  Please follow-up with cardiologist.  They are planning to see you outpatient in the next couple days.  The heart monitor will be mailed to you.

## 2023-10-15 NOTE — Telephone Encounter (Signed)
 Hi Shelly can you please order a 3-day nonlive heart monitor for PVCs.  Dr. Cindie to read.  Would love to have her results back before her appointment with EP on the 14th if we could expedite this in any way.  Thanks.

## 2023-10-15 NOTE — ED Provider Notes (Signed)
 [facility] Provider Note   CSN: [id number] Arrival date & time: 10/15/23  9063     Patient presents with: Bradycardia   Jaime Fuller is a 73 y.o. female.   HPI     Patient presents because of shortness of breath.  Palpitations.  Some slight chest pressure as well.  Patient states that she has been off of her amiodarone  since April.  Patient states that she has had episodes of PVCs and bradycardia since then but since the middle of July especially since yesterday she is feels like she is been having increasing PVCs as well as episodes of bradycardia.  Patient states she is taking her heart rate multiple times yesterday and is found to be in the 30s.  Patient states that she feels short of breath.  Maybe slightly increasing shortness of breath compared to her baseline.  Endorses some slight chest pressure as well.  No syncopal episodes.  No near syncopal episodes that she is aware of.  Patient states has been compliant with her thyroid  medication.  Otherwise, has no complaints.   Previous medical history reviewed : Patient follows up with cardiology.  History of NSTEMI.  V. tach in the setting of COVID.  PVCs.  Hypothyroidism.  Patient last admitted February 11 through the 17th for chest pain and shortness of breath.  She was COVID-positive on admit.  EKG showed PVCs.  NSVT.  Bradycardia.  Taken to the Cath Lab with normal coronaries.  Echo at that time showed EF of 50 to 55%.  Did show some regional wall motion abnormalities.  Placed on isoproterenol  and lidocaine  gtt. just an oral amnio.  Cardiac MRI was unrevealing.  Unclear etiology of ventricular arrhythmias.   Prior to Admission medications   Medication Sig Start Date End Date Taking? Authorizing Provider  metoprolol  tartrate (LOPRESSOR ) 50 MG tablet Take 0.5 tablets (25 mg total) by mouth 2 (two) times daily. 10/15/23  Yes Simon Lavonia SAILOR, MD  mexiletine (MEXITIL ) 150 MG capsule Take 1 capsule  (150 mg total) by mouth 3 (three) times daily. 10/15/23  Yes Simon Lavonia SAILOR, MD  amiodarone  (PACERONE ) 200 MG tablet Take 1 tablet (200 mg total) by mouth daily. 06/27/23   Aniceto Daphne CROME, NP  cyanocobalamin  (VITAMIN B12) 500 MCG tablet Take 500 mcg by mouth daily.    [provider]  EPINEPHrine  0.3 mg/0.3 mL IJ SOAJ injection Inject 0.3 mLs (0.3 mg total) into the muscle as needed for anaphylaxis. Patient not taking: Reported on 06/19/2023 04/03/19   Theophilus Andrews, Tully GRADE, MD  levothyroxine  (SYNTHROID ) 50 MCG tablet Take 1 tablet (50 mcg total) by mouth daily. 05/23/23   Theophilus Andrews, Tully GRADE, MD  methocarbamol (ROBAXIN) 500 MG tablet Take 500 mg by mouth every 6 (six) hours as needed for muscle spasms. 03/16/23   [provider]  pantoprazole  (PROTONIX ) 40 MG tablet Take 1 tablet (40 mg total) by mouth daily. 06/20/23   Theophilus Andrews Tully GRADE, MD  PERCOCET 5-325 MG tablet Take 1 tablet by mouth every 6 (six) hours as needed for moderate pain (pain score 4-6). Patient not taking: Reported on 06/19/2023 03/16/23   [provider]  valACYclovir  (VALTREX ) 1000 MG tablet ONE TABLET TWICE A DAY X 5 DAYS AS NEEDED FOR OUTBREAK 05/27/19   Theophilus Andrews, Tully GRADE, MD    Allergies: Bee venom and Vicodin [hydrocodone -acetaminophen ]    Review of Systems  Constitutional:  Negative for chills and fever.  HENT:  Negative for ear pain and sore throat.   Eyes:  Negative for pain and visual disturbance.  Respiratory:  Negative for cough and shortness of breath.   Cardiovascular:  Negative for chest pain and palpitations.  Gastrointestinal:  Negative for abdominal pain and vomiting.  Genitourinary:  Negative for dysuria and hematuria.  Musculoskeletal:  Negative for arthralgias and back pain.  Skin:  Negative for color change and rash.  Neurological:  Negative for seizures and syncope.  All other systems reviewed and are negative.   Updated Vital Signs BP (!) 124/39    Pulse (!) 33   Temp (!) 97.5 F (36.4 C) (Oral)   Resp 14   SpO2 100%   Physical Exam Vitals and nursing note reviewed.  Constitutional:      General: She is not in acute distress.    Appearance: She is well-developed.  HENT:     Head: Normocephalic and atraumatic.  Eyes:     Conjunctiva/sclera: Conjunctivae normal.  Cardiovascular:     Rate and Rhythm: Normal rate and regular rhythm.     Heart sounds: No murmur heard. Pulmonary:     Effort: Pulmonary effort is normal. No respiratory distress.     Breath sounds: Normal breath sounds.  Abdominal:     Palpations: Abdomen is soft.     Tenderness: There is no abdominal tenderness.  Musculoskeletal:        General: No swelling.     Cervical back: Neck supple.  Skin:    General: Skin is warm and dry.     Capillary Refill: Capillary refill takes less than 2 seconds.  Neurological:     Mental Status: She is alert.  Psychiatric:        Mood and Affect: Mood normal.     (all labs ordered are listed, but only abnormal results are displayed) Labs Reviewed  BASIC METABOLIC PANEL WITH GFR - Abnormal; Notable for the following components:      Result Value   CO2 20 (*)    Glucose, Bld 112 (*)    BUN 25 (*)    Creatinine, Ser 1.24 (*)    GFR, Estimated 47 (*)    All other components within normal limits  CBC - Abnormal; Notable for the following components:   WBC 11.3 (*)    All other components within normal limits  TSH  MAGNESIUM  TROPONIN I (HIGH SENSITIVITY)  TROPONIN I (HIGH SENSITIVITY)    EKG: EKG Interpretation Date/Time:  Monday October 15 2023 09:48:21 EDT Ventricular Rate:  95 PR Interval:  137 QRS Duration:  94 QT Interval:  392 QTC Calculation: 385 R Axis:   36  Text Interpretation: Sinus rhythm with multiple pvc and bigeminy Low voltage, precordial leads Confirmed by Simon Rea [phone]) on 10/15/2023 9:55:37 AM  Radiology: [facility] Chest Port 1 View Result Date: 10/15/2023 CLINICAL DATA:  Shortness of  breath.  PVCs and bradycardia. EXAM: PORTABLE CHEST 1 VIEW COMPARISON:  Radiographs 04/24/2023 and 09/17/2020. Cardiac CT 10/05/2020. FINDINGS: 1019 hours. The heart size and mediastinal contours are normal. The lungs are clear. There is no pleural effusion or pneumothorax. No acute osseous findings are identified. Mild degenerative changes in the spine. Telemetry leads overlie the chest. IMPRESSION: No evidence of active cardiopulmonary process. Electronically Signed   By: Elsie Perone M.D.   On: 10/15/2023 10:40     Procedures   Medications Ordered in the ED  mexiletine (MEXITIL ) capsule 150 mg (has no administration in time range)  metoprolol  tartrate (LOPRESSOR ) tablet 25 mg (has no administration in time range)    Clinical Course as of 10/15/23 1358  Mon Oct 15, 2023  1327 Discharge on mexiletine 150 mg 3 times daily, Lopressor  25 mg twice daily.  I will arrange heart monitor which will be mailed to her.  No other medication changes or additions from our standpoint. [TL]    Clinical Course User Index [TL] Simon Lavonia SAILOR, MD                                 Medical Decision Making Amount and/or Complexity of Data Reviewed Labs: ordered. Radiology: ordered.  Risk Prescription drug management.   Patient presents because of shortness of breath.  Palpitations.  Some slight chest pressure as well.  Patient states that she has been off of her amiodarone  since April.  Patient states that she has had episodes of PVCs and bradycardia since then but since the middle of July especially since yesterday she is feels like she is been having increasing PVCs as well as episodes of bradycardia.  Patient states she is taking her heart rate multiple times yesterday and is found to be in the 30s.  Patient states that she feels short of breath.  Maybe slightly increasing shortness of breath compared to her baseline.  Endorses some slight chest pressure as well.  No syncopal episodes.  No near syncopal  episodes that she is aware of.  Patient states has been compliant with her thyroid  medication.  Otherwise, has no complaints.   Previous medical history reviewed : Patient follows up with cardiology.  History of NSTEMI.  V. tach in the setting of COVID.  PVCs.  Hypothyroidism.  Patient last admitted February 11 through the 17th for chest pain and shortness of breath.  She was COVID-positive on admit.  EKG showed PVCs.  NSVT.  Bradycardia.  Taken to the Cath Lab with normal coronaries.  Echo at that time showed EF of 50 to 55%.  Did show some regional wall motion abnormalities.  Placed on isoproterenol  and lidocaine  gtt. just an oral amnio.  Cardiac MRI was unrevealing.  Unclear etiology of ventricular arrhythmias.   Upon exam, patient ANO x 3 with GCS 15.  No focal deficits.   EKG reviewed.  Showed what looks like bigeminy with multiple PVCs.  As above, has been admitted for this in the past.  Heart rate monitor reads as heart rate in the 30s 60s.  Not picking up on the bigeminy aspect.  Mentating well.  Maps appropriate.   Electrolyte workup.  Unremarkable.  Potassium and magnesium normal.  TSH normal.   Troponin x 2 unremarkable.  No STEMI on EKG.  No ACS concern at this moment of time.   This is a longstanding issue for the patient.  I did consult cardiology.  Discussed the patient.  They looked at the telemetry.  Looked at EKG.  Looked at her history.  Felt comfortable started patient on Lopressor  25 mg twice daily as well as mexiletine 150 mg TID.  Holter monitor will be sent to the patient.  She will follow-up outpatient on the 14th.  Precautions for the patient.   Feels comfortable at home at this time.  Reviewed EKG as well as telemetry.  Has occasional two run PVCs but no triple  that be concerning for V. tach.     Final diagnoses:  Other cardiac arrhythmia  Shortness of  breath    ED Discharge Orders          Ordered    mexiletine (MEXITIL ) 150 MG capsule  3 times daily         10/15/23 1346    metoprolol  tartrate (LOPRESSOR ) 50 MG tablet  2 times daily        10/15/23 1346               Simon Lavonia SAILOR, MD 10/15/23 1358

## 2023-10-15 NOTE — Telephone Encounter (Signed)
 Spoke with pt who reports active chest pain with jaw pain, HR in the 30's, palpitations and night sweats last night.  Pt advised to call EMS now for further evaluation in the ED.  Pt verbalizes understanding and agrees with current plan.

## 2023-10-15 NOTE — Progress Notes (Unsigned)
 Enrolled for [employer] to mail a ZIO XT long term holter monitor to the patients address on file.   Dr. Lalla Brothers to read.

## 2023-10-15 NOTE — Consult Note (Addendum)
 Cardiology Consultation   Patient ID: Jaime Fuller MRN: [id number]; DOB: Feb 22, 1951  Admit date: 10/15/2023 Date of Consult: 10/15/2023  PCP:  Theophilus Andrews, Tully GRADE, MD   [facility] Providers Cardiologist:  Lonni Cash, MD  Patient Profile: Jaime Fuller is a 73 y.o. female with a hx of VT (in setting of COVID), PVC's, hypothyroidism, IDA, Rouen-Y gastric bypass (2004) who is being seen 10/15/2023 for the evaluation of PVCs, chest pain, bradycardia at the request of ED.  History of Present Illness: Jaime Fuller has hospital admission in February 2025 where she had presented with chest pain and shortness of breath.  She tested positive for COVID.  EKG was showing PVCs/NSVT and bradycardia.  Had elevated troponins. Echocardiogram with preserved EF with wall motion abnormalities.  Taken to the Cath Lab, normal coronary anatomy.  Cardiac MRI was unremarkable, no LGE.  Etiology unclear.  Eventually discharged on oral amiodarone .  Amiodarone  stopped May 2025, she had been reporting sunburns, anxiety, skin sensitivity.  Today patient presenting to the emergency room due to complaints of palpitations, reported bradycardia, chest pressure, dizziness, shortness of breath.  She says that about 3 weeks ago she had a steroid injection into her back and started experiencing symptoms of significant fatigue.  Denies any syncope/falls.  She started to feel that her PVCs were returning although not always symptomatic with this.  At times she does admit to dizziness, palpitations, chest pressure and back pain.  Chest pressure not exertional and she is very active because she takes care of her grand son and always moving/agitated.  Chest pressure is not concerning to her and she has no complaints currently.  Reports sometimes that when she is laying in bed before going to sleep she gets vertigo.  No significant lab abnormalities.  Potassium 4.2.  Creatinine 1.24.  First troponin 4.  WBC 11.3.   TSH is normal.  Chest x-ray negative.   Past Medical History:  Diagnosis Date   Anxiety state, unspecified 09/20/2007   Arthritis    Cataract    Chronic kidney disease    kidney stones   GERD (gastroesophageal reflux disease)    They say so   HYPOTHYROIDISM 03/08/2007   no meds needed now 04-15-19   Pancreatitis    secondary to ERCP    Past Surgical History:  Procedure Laterality Date   abdominal plasty     Carpel tunnel surgery left hand     Childbirth x 2     CHOLECYSTECTOMY     GASTRIC BYPASS     LEFT HEART CATH AND CORONARY ANGIOGRAPHY N/A 04/25/2023   Procedure: LEFT HEART CATH AND CORONARY ANGIOGRAPHY;  Surgeon: Wonda Sharper, MD;  Location: [facility] INVASIVE CV LAB;  Service: Cardiovascular;  Laterality: N/A;   UPPER GASTROINTESTINAL ENDOSCOPY     WISDOM TOOTH EXTRACTION      Scheduled Meds:  Continuous Infusions:  PRN Meds:   Allergies:    Allergies  Allergen Reactions   Bee Venom Anaphylaxis   Vicodin [Hydrocodone -Acetaminophen ] Itching    Social History:   Social History   Socioeconomic History   Marital status: Married    Spouse name: Not on file   Number of children: Not on file   Years of education: Not on file   Highest education level: Some college, no degree  Occupational History   Not on file  Tobacco Use   Smoking status: Former    Current packs/day: 0.00    Average packs/day: 0.3 packs/day for 10.0 years (  2.5 ttl pk-yrs)    Types: Cigarettes    Quit date: 06/11/1977    Years since quitting: 46.3    Passive exposure: Past   Smokeless tobacco: Never  Vaping Use   Vaping status: Never Used  Substance and Sexual Activity   Alcohol use: No   Drug use: Never   Sexual activity: Not Currently  Other Topics Concern   Not on file  Social History Narrative   Not on file   Social Drivers of Health   Financial Resource Strain: Low Risk  (05/23/2023)   Overall Financial Resource Strain (CARDIA)    Difficulty of Paying Living Expenses: Not hard  at all  Food Insecurity: No Food Insecurity (05/23/2023)   Hunger Vital Sign    Worried About Running Out of Food in the Last Year: Never true    Ran Out of Food in the Last Year: Never true  Transportation Needs: No Transportation Needs (05/23/2023)   PRAPARE - Administrator, Civil Service (Medical): No    Lack of Transportation (Non-Medical): No  Physical Activity: Inactive (05/23/2023)   Exercise Vital Sign    Days of Exercise per Week: 0 days    Minutes of Exercise per Session: 0 min  Stress: Stress Concern Present (05/23/2023)   [employer] of Occupational Health - Occupational Stress Questionnaire    Feeling of Stress : To some extent  Social Connections: Socially Integrated (05/23/2023)   Social Connection and Isolation Panel    Frequency of Communication with Friends and Family: More than three times a week    Frequency of Social Gatherings with Friends and Family: More than three times a week    Attends Religious Services: More than 4 times per year    Active Member of [employer] or Organizations: Yes    Attends Engineer, structural: More than 4 times per year    Marital Status: Married  Recent Concern: Social Connections - Moderately Isolated (04/25/2023)   Social Connection and Isolation Panel    Frequency of Communication with Friends and Family: More than three times a week    Frequency of Social Gatherings with Friends and Family: Twice a week    Attends Religious Services: Never    Database administrator or Organizations: No    Attends Banker Meetings: Never    Marital Status: Married  Catering manager Violence: Not At Risk (05/23/2023)   Humiliation, Afraid, Rape, and Kick questionnaire    Fear of Current or Ex-Partner: No    Emotionally Abused: No    Physically Abused: No    Sexually Abused: No    Family History:   Family History  Problem Relation Age of Onset   Thyroid  disease Mother    Lung cancer Father    Heart attack  Father        Died at 74   Cancer Father    Kidney disease Father    Colon cancer Neg Hx    Esophageal cancer Neg Hx    Rectal cancer Neg Hx    Stomach cancer Neg Hx      ROS:  Please see the history of present illness.   All other ROS reviewed and negative.     Physical Exam/Data: Vitals:   10/15/23 0940 10/15/23 1045 10/15/23 1145 10/15/23 1230  BP: 117/69 (!) 109/40 106/69 (!) 130/92  Pulse: (!) 37 (!) 33 (!) 32 (!) 35  Resp: 14 14 (!) 22 16  Temp: 98.4 F (36.9 C)  SpO2: 99% 95% 98% 100%   No intake or output data in the 24 hours ending 10/15/23 1250    06/19/2023   11:04 AM 05/23/2023    9:28 AM 05/07/2023   11:19 AM  Last 3 Weights  Weight (lbs) 199 lb 3.2 oz 195 lb 4.8 oz 198 lb 3.2 oz  Weight (kg) 90.357 kg 88.587 kg 89.903 kg     There is no height or weight on file to calculate BMI.  General:  Well nourished, well developed, in no acute distress HEENT: normal Neck: no JVD Vascular: No carotid bruits; Distal pulses 2+ bilaterally Cardiac:  normal S1, S2; RRR; no murmur.  Frequent PVCs. Lungs:  clear to auscultation bilaterally, no wheezing, rhonchi or rales  Abd: soft, nontender, no hepatomegaly  Ext: no edema Musculoskeletal:  No deformities, BUE and BLE strength normal and equal Skin: warm and dry  Neuro:  CNs 2-12 intact, no focal abnormalities noted Psych:  Normal affect   EKG:  The EKG was personally reviewed and demonstrates: Sinus rhythm with bigeminal PVCs. Telemetry:  Telemetry was personally reviewed and demonstrates: Sinus rhythm, frequent PVCs and bigeminy  Relevant CV Studies: Cardiac MRI 04/2023 1.  Technically difficult study due to frequent PVCs.   2.  Normal LV size with mid inferior hypokinesis, LV EF 52%.   3.  Normal RV size and systolic function, EF 61%.   4. No myocardial LGE, so no definitive evidence for prior MI, infiltrative disease, or myocarditis.   5. Borderline elevated extracellular volume percentage at 30%. This is  nonspecific.   6.  Normal global T2   Dalton Mclean  Left heart catheterization 04/25/2023 Widely patent coronary arteries with no obstructive CAD 2.  Low normal LVEF of 50 to 55% with focal akinesis of the mid inferior wall with appearance of a focal inferior wall aneurysm  Echocardiogram 04/25/2023 1. Left ventricular ejection fraction, by estimation, is 50 to 55%. The  left ventricle has low normal function. The left ventricle demonstrates  regional wall motion abnormalities (see scoring diagram/findings for  description). Left ventricular diastolic   parameters are consistent with Grade I diastolic dysfunction (impaired  relaxation). There is moderate hypokinesis of the left ventricular,  basal-mid inferoseptal wall and inferior wall.   2. Right ventricular systolic function is normal. The right ventricular  size is normal.   3. The mitral valve is grossly normal. Trivial mitral valve  regurgitation.   4. Tricuspid valve regurgitation is mild to moderate.   5. The aortic valve is tricuspid. Aortic valve regurgitation is not  visualized. Aortic valve sclerosis/calcification is present, without any  evidence of aortic stenosis.   Comparison(s): Changes from prior study are noted. 10/13/2020: LVEF 60-65%.    Laboratory Data: High Sensitivity Troponin:   Recent Labs  Lab 10/15/23 0955  TROPONINIHS 4     Chemistry Recent Labs  Lab 10/15/23 0955  NA 140  K 4.2  CL 110  CO2 20*  GLUCOSE 112*  BUN 25*  CREATININE 1.24*  CALCIUM  9.0  MG 2.2  GFRNONAA 47*  ANIONGAP 10    No results for input(s): PROT, ALBUMIN, AST, ALT, ALKPHOS, BILITOT in the last 168 hours. Lipids No results for input(s): CHOL, TRIG, HDL, LABVLDL, LDLCALC, CHOLHDL in the last 168 hours.  Hematology Recent Labs  Lab 10/15/23 0955  WBC 11.3*  RBC 4.94  HGB 14.4  HCT 44.8  MCV 90.7  MCH 29.1  MCHC 32.1  RDW 13.2  PLT 309   Thyroid   Recent Labs  Lab 10/15/23 0955   TSH 1.377    BNPNo results for input(s): BNP, PROBNP in the last 168 hours.  DDimer No results for input(s): DDIMER in the last 168 hours.  Radiology/Studies:  DG Chest Port 1 View Result Date: 10/15/2023 CLINICAL DATA:  Shortness of breath.  PVCs and bradycardia. EXAM: PORTABLE CHEST 1 VIEW COMPARISON:  Radiographs 04/24/2023 and 09/17/2020. Cardiac CT 10/05/2020. FINDINGS: 1019 hours. The heart size and mediastinal contours are normal. The lungs are clear. There is no pleural effusion or pneumothorax. No acute osseous findings are identified. Mild degenerative changes in the spine. Telemetry leads overlie the chest. IMPRESSION: No evidence of active cardiopulmonary process. Electronically Signed   By: Elsie Perone M.D.   On: 10/15/2023 10:40     Assessment and Plan:  PVCs (bigeminy) NSVT Etiology unclear.  First diagnosed February 2025 in the setting of COVID.  Workup has been unremarkable with normal LHC, echocardiogram, cardiac MRI with no LGE.  Electrolytes are normal.  Did not tolerate amiodarone .  Reporting symptomatic PVCs with complaints of fatigue, shortness of breath for the past 3 weeks and currently in persistent bigeminy Did not tolerate amiodarone  due to skin sensitivity and would prefer not to retrial this.  This was stopped in May. Running out of options, beta-blocker previously avoided for borderline bradycardia.  Of note PVCs will falsely read bradycardia on anything but an EKG.  Heart rates are really around 60-70. May need to consider mexiletine, will discuss with the EP. Likely needs heart monitor for at least 3 days to assess efficacy.  In bigeminy she is at risk for PVC induced cardiomyopathy. Ablation has not been pursued because it can be difficult and ineffective.  Chest pressure This is a very mild concern for her.  First troponin is negative.  LHC from February 2025 demonstrating widely patent coronaries.  Suspect it is more  anxiety.  Hypothyroidism TSH is normal this admission.  Vertigo Reports episodes occurring at night when laying down before bed.  Wonder if she could have BPPV.  May benefit from ENT or Epley maneuver.  She has appointment with EP 8/14.  I think it is reasonable for her to discharge and follow-up.  She would be agreeable to this.  Addendum: EP not able to be reached.  We will start mexiletine 150 mg 3 times daily and Lopressor  25 mg twice daily.  I will arrange heart monitor for 3 days to assess PVC burden.  Hopefully will have the results by them to follow-up with Dr. Cindie on the 14th.   Risk Assessment/Risk Scores:   For questions or updates, please contact [facility] Please consult [email or web address] for contact info under    Signed, Thom LITTIE Sluder, PA-C  10/15/2023 12:50 PM  I have seen and examined the patient along with Thom LITTIE Sluder, PA-C .  I have reviewed the chart, notes and new data.  I agree with PA/NP's note.  Key new complaints: primary complaint is fatigue, also some dizziness and dyspnea. No syncope. Key examination changes: effective heart rate is in the 30s due to persistent ventricular bigeminy. Otherwise normal CV exam. Key new findings / data: background sinus rhythm in the 60s with monomorphic bigeminal PCS (RBBB, vertical axis - LVOT versus anterolateral mitral annulus?).   Previous workup with normal LV function, absence of LGE on cMRI, no coronary stenoses or valvular abnormalities suggest a low risk for life threatening arrhythmia.  PLAN: PVCs have recurred roughly 3 months after discontinuation  of amiodarone . She never wants to take amiodarone  again. Discussed use of mexiletine and low dose beta blocker. Stagger the initiation of the two meds by 24-48 hours, so that we can better identify source of possible side effects. Has EP follow up in 10 days wit Dr. Cindie. Will try to get a 3 day monitor between now and then to quantify prevalence of  arrhythmia and document presence/absence of VT. Regardless, advised purchasing a commercially available ECG monitoring device such as a smart watch or Kardia.  Jerel Balding, MD, FACC [facility] [phone] 10/15/2023, 1:41 PM

## 2023-10-17 NOTE — Telephone Encounter (Signed)
 Would recommend she take as prescribed.  There is not a lot of information about individual medications and how they're absorbed in the different weight loss surgeries.  If she develops andy GI side effects, can review with EP on how best to decrease the dose.

## 2023-10-19 ENCOUNTER — Telehealth: Payer: Self-pay

## 2023-10-19 DIAGNOSIS — M4726 Other spondylosis with radiculopathy, lumbar region: Secondary | ICD-10-CM | POA: Diagnosis not present

## 2023-10-19 DIAGNOSIS — Z6828 Body mass index (BMI) 28.0-28.9, adult: Secondary | ICD-10-CM | POA: Diagnosis not present

## 2023-10-19 NOTE — Transitions of Care (Post Inpatient/ED Visit) (Signed)
 10/19/2023  Name: Jaime Fuller MRN: [id number] DOB: 08-21-1950  Today's TOC FU Call Status: Today's TOC FU Call Status:: Successful TOC FU Call Completed TOC FU Call Complete Date: 10/19/23 Patient's Name and Date of Birth confirmed.  Transition Care Management Follow-up Telephone Call Date of Discharge: 10/15/23 Discharge Facility: [facility]) Type of Discharge: Emergency Department How have you been since you were released from the hospital?: Worse Any questions or concerns?: No  Items Reviewed: Did you receive and understand the discharge instructions provided?: Yes Medications obtained,verified, and reconciled?: Yes (Medications Reviewed) Any new allergies since your discharge?: No Dietary orders reviewed?: Yes Type of Diet Ordered:: low sodium diet, avoid caffeine, alcohol Do you have support at home?: No  Medications Reviewed Today: Medications Reviewed Today     Reviewed by Reece Fehnel, CMA (Certified Medical Assistant) on 10/19/23 at 1450  Med List Status: <None>   Medication Order Taking? Sig Documenting Provider Last Dose Status Informant  amiodarone  (PACERONE ) 200 MG tablet [id number]  Take 1 tablet (200 mg total) by mouth daily.  Patient not taking: Reported on 10/19/2023   Aniceto Daphne CROME, NP  Active   cyanocobalamin  (VITAMIN B12) 500 MCG tablet [id number] Yes Take 500 mcg by mouth daily. [provider]  Active Self, Pharmacy Records  EPINEPHrine  0.3 mg/0.3 mL IJ SOAJ injection [id number]  Inject 0.3 mLs (0.3 mg total) into the muscle as needed for anaphylaxis.  Patient not taking: Reported on 10/19/2023   Theophilus Andrews, Tully GRADE, MD  Active Self, Pharmacy Records           Med Note LEOBARDO, NICOLE   Wed Apr 25, 2023  5:08 AM) Pt keeps on hand for emergencies    levothyroxine  (SYNTHROID ) 50 MCG tablet [id number] Yes Take 1 tablet (50 mcg total) by mouth daily. Theophilus Andrews, Tully GRADE, MD  Active   methocarbamol (ROBAXIN) 500 MG tablet [id number] Yes  Take 500 mg by mouth every 6 (six) hours as needed for muscle spasms. [provider]  Active Self, Pharmacy Records  metoprolol  tartrate (LOPRESSOR ) 50 MG tablet [id number] Yes Take 0.5 tablets (25 mg total) by mouth 2 (two) times daily. Lemly, Tatum N, MD  Active   mexiletine (MEXITIL ) 150 MG capsule [id number]  Take 1 capsule (150 mg total) by mouth 3 (three) times daily.  Patient not taking: Reported on 10/19/2023   Lemly, Tatum N, MD  Active   pantoprazole  (PROTONIX ) 40 MG tablet [id number] Yes Take 1 tablet (40 mg total) by mouth daily. Theophilus Andrews, Tully GRADE, MD  Active   PERCOCET 5-325 MG tablet [id number]  Take 1 tablet by mouth every 6 (six) hours as needed for moderate pain (pain score 4-6).  Patient not taking: Reported on 10/19/2023   [provider]  Active Self, Pharmacy Records           Med Note (LEE, NICOLE   Wed Apr 25, 2023  5:12 AM) Pt. Keeps on hand for severe back pain   valACYclovir  (VALTREX ) 1000 MG tablet [id number] Yes ONE TABLET TWICE A DAY X 5 DAYS AS NEEDED FOR OUTBREAK Theophilus Andrews, Tully GRADE, MD  Active Self, Pharmacy Records           Med Note (LEE, NICOLE   Wed Apr 25, 2023  5:15 AM) Last dose 8-40mos ago; Pt keeps on hand for outbreaks  VITAMIN D  PO [id number] Yes Take by mouth daily. [provider]  Active  Home Care and Equipment/Supplies: Were Home Health Services Ordered?: No Any new equipment or medical supplies ordered?: Yes Name of Medical supply agency?: on 3 days heart monitor Were you able to get the equipment/medical supplies?: Yes Do you have any questions related to the use of the equipment/supplies?: No  Functional Questionnaire: Do you need assistance with bathing/showering or dressing?: No Do you need assistance with meal preparation?: No Do you need assistance with eating?: No Do you have difficulty maintaining continence: No Do you need assistance with getting out of bed/getting out of a  chair/moving?: No Do you have difficulty managing or taking your medications?: No  Follow up appointments reviewed: PCP Follow-up appointment confirmed?: Yes Date of PCP follow-up appointment?: 10/29/23 Follow-up Provider: Dr. Theophilus ENGLAND: Willeen Craver, CMA

## 2023-10-24 ENCOUNTER — Ambulatory Visit (HOSPITAL_BASED_OUTPATIENT_CLINIC_OR_DEPARTMENT_OTHER)
Admission: RE | Admit: 2023-10-24 | Discharge: 2023-10-24 | Disposition: A | Discharge status: Home / Self Care | Source: Ambulatory Visit | Attending: Internal Medicine | Admitting: Internal Medicine

## 2023-10-24 DIAGNOSIS — E559 Vitamin D deficiency, unspecified: Secondary | ICD-10-CM | POA: Insufficient documentation

## 2023-10-24 DIAGNOSIS — Z780 Asymptomatic menopausal state: Secondary | ICD-10-CM | POA: Diagnosis not present

## 2023-10-24 DIAGNOSIS — M8589 Other specified disorders of bone density and structure, multiple sites: Secondary | ICD-10-CM | POA: Diagnosis not present

## 2023-10-24 NOTE — Progress Notes (Deleted)
  Electrophysiology Office Follow up Visit Note:    Date:  10/24/2023   ID:  Jaime Fuller, DOB 08/11/1950, MRN [id number]  PCP:  Theophilus Andrews, Tully GRADE, MD  [facility] Cardiologist:  Lonni Cash, MD  [facility] Electrophysiologist:  OLE ONEIDA HOLTS, MD    Interval History:     Jaime Fuller is a 73 y.o. female who presents for a follow up visit.   She was last seen by [facility] June 19, 2023.  She has a history of NSTEMI, PVCs, hypothyroidism, anemia, gastric bypass surgery.  Frequent PVCs were in the setting of COVID infection.  She was on amiodarone .  She sent a message in July reporting increased palpitations.  She was prescribed mexiletine but did not tolerate this medication with GI upset.            Past medical, surgical, social and family history were reviewed.  ROS:   Please see the history of present illness.    All other systems reviewed and are negative.  EKGs/Labs/Other Studies Reviewed:    The following studies were reviewed today:  April 27, 2023 cardiac MRI EF 52% No LGE  October 15, 2023 EKG shows sinus rhythm.  Frequent PVCs.  Monomorphic PVCs.  PVCs have a steeply inferior axis and are positive throughout the precordium        Physical Exam:    VS:  There were no vitals taken for this visit.    Wt Readings from Last 3 Encounters:  06/19/23 199 lb 3.2 oz (90.4 kg)  05/23/23 195 lb 4.8 oz (88.6 kg)  05/07/23 198 lb 3.2 oz (89.9 kg)     GEN: no distress CARD: RRR, No MRG RESP: No IWOB. CTAB.      ASSESSMENT:    No diagnosis found. PLAN:    In order of problems listed above:  #Frequent PVCs #High risk med monitoring-amiodarone *** Also on mexiletine and metoprolol .  Continues to have symptomatic episodes.  I have discussed treatment options with the patient including continued antiarrhythmic drug therapy versus EP study with possible ablation.***  Discussed treatment options today for PVCs including  antiarrhythmic drug therapy and ablation. Discussed risks, recovery and likelihood of success with each treatment strategy. Risk, benefits, and alternatives to EP study and ablation for PVCs were discussed. These risks include but are not limited to stroke, bleeding, vascular damage, tamponade, perforation, damage to the phrenic nerve, conduction system, worsening renal function, coronary vasospasm and death.  Discussed potential need for repeat ablation procedures and antiarrhythmic drugs after an initial ablation. The patient understands these risk and wishes to proceed.  We will therefore proceed with catheter ablation at the next available time.  Carto, ICE, anesthesia are requested for the procedure.       Signed, OLE HOLTS, MD, [facility], [facility] 10/24/2023 7:44 AM    Electrophysiology  [facility]

## 2023-10-24 NOTE — Telephone Encounter (Signed)
 Ok to stop Metoprolol . Though I am not sure these symptoms are related.  Can we please get her in to see her PCP?    Best Regards,  Daphne

## 2023-10-25 ENCOUNTER — Ambulatory Visit: Admitting: Cardiology

## 2023-10-25 DIAGNOSIS — I493 Ventricular premature depolarization: Secondary | ICD-10-CM

## 2023-10-25 DIAGNOSIS — Z79899 Other long term (current) drug therapy: Secondary | ICD-10-CM

## 2023-10-26 DIAGNOSIS — I493 Ventricular premature depolarization: Secondary | ICD-10-CM | POA: Diagnosis not present

## 2023-10-29 ENCOUNTER — Telehealth: Payer: Self-pay

## 2023-10-29 ENCOUNTER — Encounter: Payer: Self-pay | Admitting: Internal Medicine

## 2023-10-29 ENCOUNTER — Ambulatory Visit (INDEPENDENT_AMBULATORY_CARE_PROVIDER_SITE_OTHER): Admitting: Internal Medicine

## 2023-10-29 ENCOUNTER — Other Ambulatory Visit (HOSPITAL_COMMUNITY): Payer: Self-pay

## 2023-10-29 VITALS — BP 110/74 | HR 50 | Temp 97.7°F | Wt 191.3 lb

## 2023-10-29 DIAGNOSIS — F411 Generalized anxiety disorder: Secondary | ICD-10-CM

## 2023-10-29 DIAGNOSIS — I498 Other specified cardiac arrhythmias: Secondary | ICD-10-CM

## 2023-10-29 DIAGNOSIS — Z09 Encounter for follow-up examination after completed treatment for conditions other than malignant neoplasm: Secondary | ICD-10-CM

## 2023-10-29 MED ORDER — SERTRALINE HCL 25 MG PO TABS: 25.0000 mg | ORAL_TABLET | Freq: Every day | ORAL | 3 refills | Status: DC

## 2023-10-29 MED ORDER — WEGOVY 0.25 MG/0.5ML ~~LOC~~ SOAJ: 0.2500 mg | SUBCUTANEOUS | 0 refills | Status: DC

## 2023-10-29 NOTE — Progress Notes (Signed)
0 

## 2023-10-29 NOTE — Progress Notes (Signed)
 Established Patient Office Visit     CC/Reason for Visit: ED follow-up, discuss other acute concerns  HPI: Jaime Fuller is a 73 y.o. female who is coming in today for the above mentioned reasons. Past Medical History is significant for: Ventricular bigeminy.  She was in the emergency department 8/4 due to fatigue, dizziness and increased palpitations.  She is noted to have ventricular bigeminy.  She did not tolerate amiodarone .  She was asked to try metoprolol  and mexiletine both she did not tolerate either.  She said with mexiletine she had significant acid reflux and with metoprolol  had a sore throat and discontinued after only 1 dose.  She is scheduled to follow-up with EP on 8/22.  She did wear a 3-day Zio patch and is awaiting results.  She is wanting to try Wegovy  for morbid obesity.  She had been on sertraline  in the past for her anxiety and is wanting to restart again.   Past Medical/Surgical History: Past Medical History:  Diagnosis Date   Anxiety state, unspecified 09/20/2007   Arthritis    Cataract    Chronic kidney disease    kidney stones   GERD (gastroesophageal reflux disease)    They say so   HYPOTHYROIDISM 03/08/2007   no meds needed now 04-15-19   Pancreatitis    secondary to ERCP    Past Surgical History:  Procedure Laterality Date   abdominal plasty     Carpel tunnel surgery left hand     Childbirth x 2     CHOLECYSTECTOMY     GASTRIC BYPASS     LEFT HEART CATH AND CORONARY ANGIOGRAPHY N/A 04/25/2023   Procedure: LEFT HEART CATH AND CORONARY ANGIOGRAPHY;  Surgeon: Wonda Sharper, MD;  Location: [facility] INVASIVE CV LAB;  Service: Cardiovascular;  Laterality: N/A;   UPPER GASTROINTESTINAL ENDOSCOPY     WISDOM TOOTH EXTRACTION      Social History:  reports that she quit smoking about 46 years ago. Her smoking use included cigarettes. She has a 2.5 pack-year smoking history. She has been exposed to tobacco smoke. She has never used smokeless tobacco. She  reports that she does not drink alcohol and does not use drugs.  Allergies: Allergies  Allergen Reactions   Bee Venom Anaphylaxis   Vicodin [Hydrocodone -Acetaminophen ] Itching   Metoprolol      Family History:  Family History  Problem Relation Age of Onset   Thyroid  disease Mother    Lung cancer Father    Heart attack Father        Died at 10   Cancer Father    Kidney disease Father    Colon cancer Neg Hx    Esophageal cancer Neg Hx    Rectal cancer Neg Hx    Stomach cancer Neg Hx      Current Outpatient Medications:    cyanocobalamin  (VITAMIN B12) 500 MCG tablet, Take 500 mcg by mouth daily., Disp: , Rfl:    EPINEPHrine  0.3 mg/0.3 mL IJ SOAJ injection, Inject 0.3 mLs (0.3 mg total) into the muscle as needed for anaphylaxis., Disp: 1 each, Rfl: 1   levothyroxine  (SYNTHROID ) 50 MCG tablet, Take 1 tablet (50 mcg total) by mouth daily., Disp: 90 tablet, Rfl: 1   pantoprazole  (PROTONIX ) 40 MG tablet, Take 1 tablet (40 mg total) by mouth daily., Disp: 90 tablet, Rfl: 1   semaglutide -weight management (WEGOVY ) 0.25 MG/0.5ML SOAJ SQ injection, Inject 0.25 mg into the skin once a week., Disp: 2 mL, Rfl: 0  sertraline  (ZOLOFT ) 25 MG tablet, Take 1 tablet (25 mg total) by mouth daily., Disp: 30 tablet, Rfl: 3   valACYclovir  (VALTREX ) 1000 MG tablet, ONE TABLET TWICE A DAY X 5 DAYS AS NEEDED FOR OUTBREAK, Disp: 50 tablet, Rfl: 1   VITAMIN D  PO, Take by mouth daily., Disp: , Rfl:    amiodarone  (PACERONE ) 200 MG tablet, Take 1 tablet (200 mg total) by mouth daily. (Patient not taking: Reported on 10/19/2023), Disp: 90 tablet, Rfl: 3   methocarbamol (ROBAXIN) 500 MG tablet, Take 500 mg by mouth every 6 (six) hours as needed for muscle spasms. (Patient not taking: Reported on 10/29/2023), Disp: , Rfl:    metoprolol  tartrate (LOPRESSOR ) 50 MG tablet, Take 0.5 tablets (25 mg total) by mouth 2 (two) times daily., Disp: 30 tablet, Rfl: 1   mexiletine (MEXITIL ) 150 MG capsule, Take 1 capsule (150 mg  total) by mouth 3 (three) times daily. (Patient not taking: Reported on 10/19/2023), Disp: 90 capsule, Rfl: 1   PERCOCET 5-325 MG tablet, Take 1 tablet by mouth every 6 (six) hours as needed for moderate pain (pain score 4-6). (Patient not taking: Reported on 10/29/2023), Disp: , Rfl:   Review of Systems:  Negative unless indicated in HPI.   Physical Exam: Vitals:   10/29/23 0706  BP: 110/74  Pulse: (!) 50  Temp: 97.7 F (36.5 C)  TempSrc: Oral  SpO2: 99%  Weight: 191 lb 4.8 oz (86.8 kg)    Body mass index is 36.15 kg/m.   Physical Exam Vitals reviewed.  Constitutional:      Appearance: Normal appearance. She is obese.  HENT:     Head: Normocephalic and atraumatic.  Eyes:     Conjunctiva/sclera: Conjunctivae normal.  Cardiovascular:     Rate and Rhythm: Normal rate.  Pulmonary:     Effort: Pulmonary effort is normal.     Breath sounds: Normal breath sounds.  Skin:    General: Skin is warm and dry.  Neurological:     General: No focal deficit present.     Mental Status: She is alert and oriented to person, place, and time.  Psychiatric:        Mood and Affect: Mood normal.        Behavior: Behavior normal.        Thought Content: Thought content normal.        Judgment: Judgment normal.      Impression and Plan:  Hospital discharge follow-up  Ventricular bigeminy  Morbid obesity (HCC) -     Wegovy ; Inject 0.25 mg into the skin once a week.  Dispense: 2 mL; Refill: 0  GAD (generalized anxiety disorder) -     Sertraline  HCl; Take 1 tablet (25 mg total) by mouth daily.  Dispense: 30 tablet; Refill: 3  - Hospital charts reviewed in detail.  Unfortunately she did not tolerate either metoprolol  or mexiletine.  She has follow-up with EP Dr. Cindie on 8/22 to discuss further. - She would like to resume sertraline  for anxiety, this may help her PVCs as well. -Discussed healthy lifestyle, including increased physical activity and better food choices to promote weight  loss. - She would also like to try Wegovy  for her morbid obesity..  Will start at 0.25 mg weekly.  Time spent:30 minutes reviewing chart, interviewing and examining patient and formulating plan of care.     Tully Theophilus Andrews, MD [facility]

## 2023-10-29 NOTE — Telephone Encounter (Signed)
 Pharmacy Patient Advocate Encounter   Received notification from CoverMyMeds that prior authorization for Wegovy  0.25 is required/requested.   Insurance verification completed.   The patient is insured through [employer] .   Per test claim: PA required; PA submitted to above mentioned insurance via Latent Key/confirmation #/EOC [id number] Status is pending

## 2023-10-30 ENCOUNTER — Other Ambulatory Visit: Payer: Self-pay | Admitting: Internal Medicine

## 2023-10-30 ENCOUNTER — Other Ambulatory Visit (HOSPITAL_COMMUNITY): Payer: Self-pay

## 2023-10-30 ENCOUNTER — Telehealth: Payer: Self-pay | Admitting: *Deleted

## 2023-10-30 MED ORDER — ZEPBOUND 2.5 MG/0.5ML ~~LOC~~ SOAJ: 2.5000 mg | SUBCUTANEOUS | 0 refills | Status: DC

## 2023-10-30 NOTE — Telephone Encounter (Signed)
 Copied from CRM [id number]. Topic: Clinical - Prescription Issue >> Oct 29, 2023  4:37 PM [place] J wrote: Reason for CRM: Patient's insurance is needing Dr. Theophilus to specify her diagnosis for needing  semaglutide -weight management (WEGOVY ) 0.25 MG/0.5ML SOAJ SQ injection. Patient stated that they're looking for her obesity diagnosis as well as any other heart problems she's having.   Patient would like a call from [place] as soon as possible.  (Patient is aware of the next day turn around time.)

## 2023-10-31 ENCOUNTER — Other Ambulatory Visit (HOSPITAL_COMMUNITY): Payer: Self-pay

## 2023-10-31 DIAGNOSIS — I493 Ventricular premature depolarization: Secondary | ICD-10-CM

## 2023-10-31 NOTE — Telephone Encounter (Signed)
 Pharmacy Patient Advocate Encounter  Received notification from [employer] that Prior Authorization for Wegovy  0.25has been DENIED.  Full denial letter will be uploaded to the media tab. See denial reason below.     PA #/Case ID/Reference #: [id number]

## 2023-10-31 NOTE — Progress Notes (Unsigned)
 Electrophysiology Office Follow up Visit Note:    Date:  11/02/2023   ID:  KORTNIE STOVALL, DOB 1951-01-20, MRN [id number]  PCP:  Theophilus Andrews, Tully GRADE, MD  [facility] Cardiologist:  Lonni Cash, MD  [facility] Electrophysiologist:  OLE ONEIDA HOLTS, MD    Interval History:     Jaime Fuller is a 73 y.o. female who presents for a follow up visit.   She was last seen by [facility] June 19, 2023.  She has a history of NSTEMI, PVCs, hypothyroidism, anemia, gastric bypass surgery.  Frequent PVCs were in the setting of COVID infection.  She was on amiodarone .  She sent a message in July reporting increased palpitations.  She was prescribed mexiletine but did not tolerate this medication with GI upset. She is also stopped her amiodarone  after reading some information about the drug.  She tells me that her PVCs were well-controlled on amiodarone .           Past medical, surgical, social and family history were reviewed.  ROS:   Please see the history of present illness.    All other systems reviewed and are negative.  EKGs/Labs/Other Studies Reviewed:    The following studies were reviewed today:  April 27, 2023 cardiac MRI EF 52% No LGE  October 15, 2023 EKG shows sinus rhythm.  Frequent PVCs.  Monomorphic PVCs.  PVCs have a steeply inferior axis and are positive throughout the precordium   EKG Interpretation Date/Time:  Friday November 02 2023 08:10:19 EDT Ventricular Rate:  96 PR Interval:  148 QRS Duration:  78 QT Interval:  370 QTC Calculation: 467 R Axis:   20  Text Interpretation: Sinus rhythm with frequent and consecutive Premature ventricular complexes Confirmed by HOLTS OLE [phone]) on 11/02/2023 8:12:12 AM    Physical Exam:    VS:  BP 116/62   Pulse 96   Ht 5' 2.5 (1.588 m)   Wt 192 lb 8 oz (87.3 kg)   SpO2 98%   BMI 34.65 kg/m     Wt Readings from Last 3 Encounters:  11/02/23 192 lb 8 oz (87.3 kg)  10/29/23 191 lb 4.8 oz  (86.8 kg)  06/19/23 199 lb 3.2 oz (90.4 kg)     GEN: no distress CARD: Irregular rhythm, No MRG RESP: No IWOB. CTAB.      ASSESSMENT:    1. PVC's (premature ventricular contractions)    PLAN:    In order of problems listed above:  #Frequent PVCs  Previously controlled on amiodarone  but she stopped this medication given potential side effects.  She was intolerant to metoprolol .  Today I discussed alternative antiarrhythmic drugs and catheter ablation.  She would like to proceed with a conservative approach to start.  We will start flecainide  50 mg by mouth twice daily in addition to propranolol  20 mg by mouth twice daily.  We are hoping that the propranolol  may also add some added anxiety relief.  She will need an EKG in 7 to 10 days.  She will follow-up with an APP in 6 to 8 weeks.  If she fails flecainide , favor restarting amiodarone  with a goal of getting 1200 mg by mouth once daily.  This was discussed with the patient during today's visit and she would be open to this.  I discussed the data showing safety with close monitoring.   Follow-up 6 to 8 weeks with APP  Signed, OLE HOLTS, MD, [facility], [facility] 11/02/2023 8:33 AM    Electrophysiology [facility]

## 2023-11-02 ENCOUNTER — Other Ambulatory Visit: Payer: Self-pay | Admitting: Internal Medicine

## 2023-11-02 ENCOUNTER — Telehealth: Payer: Self-pay

## 2023-11-02 ENCOUNTER — Encounter: Payer: Self-pay | Admitting: Cardiology

## 2023-11-02 ENCOUNTER — Other Ambulatory Visit (HOSPITAL_COMMUNITY): Payer: Self-pay

## 2023-11-02 ENCOUNTER — Ambulatory Visit: Payer: Self-pay | Admitting: Cardiology

## 2023-11-02 ENCOUNTER — Ambulatory Visit: Attending: Cardiology | Admitting: Cardiology

## 2023-11-02 VITALS — BP 116/62 | HR 96 | Ht 62.5 in | Wt 192.5 lb

## 2023-11-02 DIAGNOSIS — I493 Ventricular premature depolarization: Secondary | ICD-10-CM | POA: Diagnosis not present

## 2023-11-02 MED ORDER — FLECAINIDE ACETATE 50 MG PO TABS: 50.0000 mg | ORAL_TABLET | Freq: Two times a day (BID) | ORAL | 1 refills | Status: DC

## 2023-11-02 MED ORDER — PROPRANOLOL HCL 20 MG PO TABS: 20.0000 mg | ORAL_TABLET | Freq: Two times a day (BID) | ORAL | 1 refills | Status: DC

## 2023-11-02 NOTE — Telephone Encounter (Signed)
 Please contact Samantha from [employer] as described below for prior auth for medication   Copied from CRM [id number]. Topic: Clinical - Medication Prior Auth >> Nov 02, 2023  9:59 AM Logan F wrote: Reason for CRM: Lucie from [employer]. There is an open auth for tirzepatide  (ZEPBOUND ) 2.5 MG/0.5ML Pen it ends today at 4:13pm EST. They have clinical questions to get this completed. Please call at [phone]

## 2023-11-02 NOTE — Telephone Encounter (Signed)
 Pharmacy Patient Advocate Encounter  Received notification from [employer] that Prior Authorization for Zepbound  2.5mg /0.76ml has been DENIED.  See denial reason below. No denial letter attached in CMM. Will attach denial letter to Media tab once received.

## 2023-11-02 NOTE — Patient Instructions (Signed)
 Medication Instructions:  Your physician has recommended you make the following change in your medication:   ** Begin Flecainide  50mg  - 1 tablet by mouth twice daily.  ** Begin Propranolol  20mg  - 1 tablet by mouth twice daily.  *If you need a refill on your cardiac medications before your next appointment, please call your pharmacy*  Lab Work: None ordered.  If you have labs (blood work) drawn today and your tests are completely normal, you will receive your results only by: MyChart Message (if you have MyChart) OR A paper copy in the mail If you have any lab test that is abnormal or we need to change your treatment, we will call you to review the results.  Testing/Procedures: None ordered.   Follow-Up: At [facility], you and your health needs are our priority.  As part of our continuing mission to provide you with exceptional heart care, our providers are all part of one team.  This team includes your primary Cardiologist (physician) and Advanced Practice Providers or APPs (Physician Assistants and Nurse Practitioners) who all work together to provide you with the care you need, when you need it.  Your next appointment:   Nurse Visit in 7-10 days for Flecainide  start  6-8 weeks with Dr Hiram APP

## 2023-11-05 ENCOUNTER — Ambulatory Visit: Payer: Self-pay | Admitting: Internal Medicine

## 2023-11-05 ENCOUNTER — Telehealth: Payer: Self-pay | Admitting: Cardiovascular Disease

## 2023-11-05 ENCOUNTER — Telehealth: Payer: Self-pay | Admitting: *Deleted

## 2023-11-05 NOTE — Telephone Encounter (Signed)
 Spoke with [employer], aware to contact her medical doctor.

## 2023-11-05 NOTE — Telephone Encounter (Signed)
 Copied from CRM [id number]. Topic: Clinical - Medication Prior Auth >> Nov 02, 2023  9:59 AM Logan F wrote: Reason for CRM: Lucie from [employer]. There is an open auth for tirzepatide  (ZEPBOUND ) 2.5 MG/0.5ML Pen it ends today at 4:13pm EST. They have clinical questions to get this completed. Please call at [phone] >> Nov 02, 2023  4:30 PM Drema MATSU wrote: Patient called for an update regarding Zepbound  medication. She does have documentation from her other provider that she can send to clinic. Please call patient.

## 2023-11-05 NOTE — Telephone Encounter (Signed)
 Pt c/o medication issue:  1. Name of Medication: tirzepatide  (ZEPBOUND ) 2.5 MG/0.5ML Pen   2. How are you currently taking this medication (dosage and times per day)?    3. Are you having a reaction (difficulty breathing--STAT)? no  4. What is your medication issue? Calling to ask clinical question. Case ref #[id number]. Please advise

## 2023-11-06 ENCOUNTER — Telehealth: Payer: Self-pay | Admitting: *Deleted

## 2023-11-06 NOTE — Telephone Encounter (Signed)
 Copied from CRM [id number]. Topic: Clinical - Medication Question >> Nov 06, 2023  9:49 AM Ahlexyia S wrote: Reason for CRM: Patsy from [employer] appeal department is calling in about pt medication tirzepatide  (ZEPBOUND ) 2.5 MG/0.5ML Pen. Patsy stated she has some clinical questions that need to be answered about med. Contacted CAL and was told that there was no one available to speak to [facility] about med. CAL requested CRM. Informed Patsy that someone from clinic will contact her soon Callback # [phone] Reference # [id number]

## 2023-11-07 ENCOUNTER — Telehealth: Payer: Self-pay | Admitting: Pharmacist

## 2023-11-07 ENCOUNTER — Other Ambulatory Visit (HOSPITAL_COMMUNITY): Payer: Self-pay

## 2023-11-07 NOTE — Telephone Encounter (Signed)
 Pharmacy Patient Advocate Encounter   Received notification from Physician's Office that prior authorization for Wegovy  0.25 is required/requested.   Insurance verification completed.   The patient is insured through [employer] .   Per test claim: request was cancelled due to previous denial.

## 2023-11-07 NOTE — Telephone Encounter (Signed)
 Appeal has been submitted for Wegovy . Will advise when response is received, please be advised that most companies may take 30 days to make a decision. Appeal letter and supporting documentation have been faxed to [phone] on 11/07/2023 @12 :52 pm.  Thank you, Devere Pandy, PharmD Clinical Pharmacist  [place]  Direct Dial: [phone]

## 2023-11-08 ENCOUNTER — Telehealth: Payer: Self-pay

## 2023-11-08 ENCOUNTER — Other Ambulatory Visit (HOSPITAL_COMMUNITY): Payer: Self-pay

## 2023-11-08 NOTE — Telephone Encounter (Signed)
 Additional information has been requested from the patient's insurance in order to proceed with the appeal request. Requested information has been sent, or form has been filled out and faxed back to [phone]

## 2023-11-08 NOTE — Telephone Encounter (Signed)
 RN spoke w/ patient to reschedule appt from 9.8.25 11:00AM to 9.12.25 at 2:00PM

## 2023-11-09 NOTE — Telephone Encounter (Signed)
 The appeal for Wegovy  has been approved by the insurance through 11/07/2024, full letter can be found under the media tab.        Thank you, Devere Pandy, PharmD Clinical Pharmacist  [facility] [facility]  Direct Dial: [phone]

## 2023-11-13 ENCOUNTER — Other Ambulatory Visit (HOSPITAL_COMMUNITY): Payer: Self-pay

## 2023-11-13 NOTE — Telephone Encounter (Signed)
 FYI - Patient is aware via Mychart.

## 2023-11-18 ENCOUNTER — Other Ambulatory Visit: Payer: Self-pay | Admitting: Internal Medicine

## 2023-11-18 MED ORDER — WEGOVY 0.25 MG/0.5ML ~~LOC~~ SOAJ: 0.2500 mg | SUBCUTANEOUS | 0 refills | Status: DC

## 2023-11-19 ENCOUNTER — Ambulatory Visit

## 2023-11-23 ENCOUNTER — Ambulatory Visit: Attending: Cardiology

## 2023-11-23 VITALS — BP 102/50 | HR 55 | Ht 63.0 in | Wt 186.8 lb

## 2023-11-23 DIAGNOSIS — Z79899 Other long term (current) drug therapy: Secondary | ICD-10-CM | POA: Insufficient documentation

## 2023-11-23 DIAGNOSIS — I493 Ventricular premature depolarization: Secondary | ICD-10-CM | POA: Diagnosis not present

## 2023-11-23 NOTE — Patient Instructions (Signed)
 Medication Instructions:  No medication changes were made at this visit. Continue current regimen.   *If you need a refill on your cardiac medications before your next appointment, please call your pharmacy*  Lab Work: None ordered today. If you have labs (blood work) drawn today and your tests are completely normal, you will receive your results only by: MyChart Message (if you have MyChart) OR A paper copy in the mail If you have any lab test that is abnormal or we need to change your treatment, we will call you to review the results.  Testing/Procedures: EKG check today 9/12 after newly starting Flecainide 

## 2023-11-23 NOTE — Progress Notes (Signed)
   Nurse Visit   Date of Encounter: 11/23/2023 ID: NATAYLA CADENHEAD, DOB [birth year], MRN [id number]  PCP:  Theophilus Andrews, Tully GRADE, MD   [facility] Providers Cardiologist:  Lonni Cash, MD Electrophysiologist:  OLE ONEIDA HOLTS, MD      Visit Details   VS:  BP (!) 102/50 (BP Location: Left Arm, Patient Position: Sitting, Cuff Size: Large)   Pulse (!) 55   Ht 5' 3 (1.6 m)   Wt 186 lb 12.8 oz (84.7 kg)   SpO2 96%   BMI 33.09 kg/m  , BMI Body mass index is 33.09 kg/m.  Wt Readings from Last 3 Encounters:  11/23/23 186 lb 12.8 oz (84.7 kg)  11/02/23 192 lb 8 oz (87.3 kg)  10/29/23 191 lb 4.8 oz (86.8 kg)     Reason for visit: EKG check for new Flecainide  start Performed today: Vitals, EKG, and Provider consulted:Dr. Pietro Changes (medications, testing, etc.) : None Length of Visit: 10 minutes    Medications Adjustments/Labs and Tests Ordered: Orders Placed This Encounter  Procedures   EKG 12-Lead   No orders of the defined types were placed in this encounter.    Bonney Ronal SHAUNNA Gordon, RN  11/23/2023 1:48 PM

## 2023-11-29 ENCOUNTER — Other Ambulatory Visit: Payer: Self-pay | Admitting: *Deleted

## 2023-11-29 DIAGNOSIS — F411 Generalized anxiety disorder: Secondary | ICD-10-CM

## 2023-11-29 MED ORDER — SERTRALINE HCL 25 MG PO TABS: 25.0000 mg | ORAL_TABLET | Freq: Every day | ORAL | 1 refills | Status: AC

## 2023-12-13 ENCOUNTER — Other Ambulatory Visit: Payer: Self-pay | Admitting: Internal Medicine

## 2023-12-14 ENCOUNTER — Encounter (HOSPITAL_COMMUNITY): Payer: Self-pay | Admitting: *Deleted

## 2023-12-14 ENCOUNTER — Encounter: Payer: Self-pay | Admitting: Student

## 2023-12-14 ENCOUNTER — Ambulatory Visit: Attending: Student | Admitting: Student

## 2023-12-14 VITALS — BP 106/68 | HR 52 | Ht 63.0 in | Wt 184.0 lb

## 2023-12-14 DIAGNOSIS — Z79899 Other long term (current) drug therapy: Secondary | ICD-10-CM | POA: Diagnosis not present

## 2023-12-14 DIAGNOSIS — I493 Ventricular premature depolarization: Secondary | ICD-10-CM | POA: Insufficient documentation

## 2023-12-14 NOTE — Patient Instructions (Signed)
 Medication Instructions:  Your physician recommends that you continue on your current medications as directed. Please refer to the Current Medication list given to you today.  *If you need a refill on your cardiac medications before your next appointment, please call your pharmacy*  Lab Work: None ordered If you have labs (blood work) drawn today and your tests are completely normal, you will receive your results only by: MyChart Message (if you have MyChart) OR A paper copy in the mail If you have any lab test that is abnormal or we need to change your treatment, we will call you to review the results.  Testing/Procedures: Your provider has recommended that you have an exercise tolerance test.  Follow-Up: At [facility], you and your health needs are our priority.  As part of our continuing mission to provide you with exceptional heart care, our providers are all part of one team.  This team includes your primary Cardiologist (physician) and Advanced Practice Providers or APPs (Physician Assistants and Nurse Practitioners) who all work together to provide you with the care you need, when you need it.  Your next appointment:   6 month(s)  Provider:   Ozell Jodie Passey, PA-C

## 2023-12-14 NOTE — Progress Notes (Signed)
  Electrophysiology Office Note:   Date:  12/14/2023  ID:  Jaime Fuller, DOB 12/31/1950, MRN [id number]  Primary Cardiologist: Lonni Cash, MD Electrophysiologist: OLE ONEIDA HOLTS, MD   Electrophysiologist:  OLE ONEIDA HOLTS, MD      History of Present Illness:   Jaime Fuller is a 73 y.o. female with h/o NSTEMI, PVCs, hypothyroidism, anemia, and gastric bypass surgery seen today for routine electrophysiology followup.   Since last being seen in our clinic the patient reports doing very well. No breakthrough ectopy of which she is aware. Energy levels have also improved. Overall, she denies chest pain, palpitations, dyspnea, PND, orthopnea, nausea, vomiting, dizziness, syncope, edema, weight gain, or early satiety.   Review of systems complete and found to be negative unless listed in HPI.   EP Information / Studies Reviewed:    EKG is ordered today. Personal review as below.  EKG Interpretation Date/Time:  Friday December 14 2023 11:37:50 EDT Ventricular Rate:  53 PR Interval:  182 QRS Duration:  98 QT Interval:  434 QTC Calculation: 407 R Axis:   62  Text Interpretation: Sinus bradycardia Low voltage QRS Nonspecific T wave abnormality When compared with ECG of 23-Nov-2023 13:52, No significant change was found Confirmed by Lesia Sharper [phone]) on 12/14/2023 11:41:43 AM    Arrhythmia/Device History No specialty comments available.   Physical Exam:   VS:  BP 106/68   Pulse (!) 52   Ht 5' 3 (1.6 m)   Wt 184 lb (83.5 kg)   SpO2 96%   BMI 32.59 kg/m    Wt Readings from Last 3 Encounters:  12/14/23 184 lb (83.5 kg)  11/23/23 186 lb 12.8 oz (84.7 kg)  11/02/23 192 lb 8 oz (87.3 kg)     GEN: No acute distress NECK: No JVD; No carotid bruits CARDIAC: Regular rate and rhythm, no murmurs, rubs, gallops RESPIRATORY:  Clear to auscultation without rales, wheezing or rhonchi  ABDOMEN: Soft, non-tender, non-distended EXTREMITIES:  No edema; No deformity    ASSESSMENT AND PLAN:    Frequent PVCs Felt better on amiodarone , but stopped given potential side effects Intolerant to Toprol  and Mexitil  Continue flecainide  50 mg BID Continue propranolol  20 mg BID  Will order ETT for flecainide  monitoring.   If fails, would consider restarting low dose amiodarone .      Follow up with EP Team in 6 months  Signed, Sharper Prentice Lesia, PA-C

## 2023-12-17 ENCOUNTER — Other Ambulatory Visit: Payer: Self-pay | Admitting: Internal Medicine

## 2023-12-17 MED ORDER — WEGOVY 0.5 MG/0.5ML ~~LOC~~ SOAJ: 0.5000 mg | SUBCUTANEOUS | 0 refills | Status: DC

## 2023-12-17 NOTE — Addendum Note (Signed)
 Addended by: KATHRYNE MILLMAN B on: 12/17/2023 02:28 PM   Modules accepted: Orders

## 2023-12-17 NOTE — Telephone Encounter (Signed)
 Refill sent.

## 2023-12-18 DIAGNOSIS — Z23 Encounter for immunization: Secondary | ICD-10-CM | POA: Diagnosis not present

## 2023-12-19 MED ORDER — WEGOVY 0.5 MG/0.5ML ~~LOC~~ SOAJ: 0.5000 mg | SUBCUTANEOUS | 0 refills | Status: DC

## 2023-12-19 NOTE — Addendum Note (Signed)
 Addended by: KATHRYNE MILLMAN B on: 12/19/2023 04:16 PM   Modules accepted: Orders

## 2023-12-25 ENCOUNTER — Telehealth (HOSPITAL_COMMUNITY): Payer: Self-pay

## 2023-12-25 NOTE — Telephone Encounter (Signed)
 Detailed instructions left on the patient's answering machine. Asked to call back with any questions.  S.Alessa Mazur CCT

## 2023-12-28 ENCOUNTER — Ambulatory Visit (HOSPITAL_COMMUNITY)
Admission: RE | Admit: 2023-12-28 | Discharge: 2023-12-28 | Disposition: A | Discharge status: Home / Self Care | Source: Ambulatory Visit | Attending: Student | Admitting: Student

## 2023-12-28 DIAGNOSIS — Z79899 Other long term (current) drug therapy: Secondary | ICD-10-CM | POA: Insufficient documentation

## 2023-12-28 DIAGNOSIS — I493 Ventricular premature depolarization: Secondary | ICD-10-CM | POA: Diagnosis not present

## 2023-12-28 LAB — EXERCISE TOLERANCE TEST
Angina Index: 0
Duke Treadmill Score: 6
Estimated workload: 7
Exercise duration (min): 6 min
Exercise duration (sec): 0 s
MPHR: 148 {beats}/min
Peak HR: 112 {beats}/min
Percent HR: 75 %
Rest HR: 55 {beats}/min
ST Depression (mm): 0 mm

## 2023-12-30 ENCOUNTER — Ambulatory Visit: Payer: Self-pay | Admitting: Student

## 2024-01-16 ENCOUNTER — Other Ambulatory Visit: Payer: Self-pay | Admitting: Internal Medicine

## 2024-01-16 ENCOUNTER — Other Ambulatory Visit: Payer: Self-pay

## 2024-01-16 MED ORDER — WEGOVY 1 MG/0.5ML ~~LOC~~ SOAJ: 1.0000 mg | SUBCUTANEOUS | 0 refills | Status: DC

## 2024-01-17 ENCOUNTER — Telehealth: Payer: Self-pay | Admitting: Internal Medicine

## 2024-01-17 ENCOUNTER — Other Ambulatory Visit: Payer: Self-pay | Admitting: Internal Medicine

## 2024-01-17 MED ORDER — WEGOVY 1 MG/0.5ML ~~LOC~~ SOAJ: 1.0000 mg | SUBCUTANEOUS | 0 refills | Status: DC

## 2024-01-17 NOTE — Telephone Encounter (Signed)
 See MyChart message

## 2024-01-17 NOTE — Telephone Encounter (Signed)
 Copied from CRM [id number]. Topic: Clinical - Prescription Issue >> Jan 17, 2024 10:20 AM Terri MATSU wrote: Reason for CRM: Patient stated she has been in contact with [facility] about her medication on MyChart but I can't find/see any msgs on here but she stated she keeps sending the medication semaglutide -weight management (WEGOVY ) 1 MG/0.5ML SOAJ SQ injection to [facility] instead of [facility]. And she stated [facility] stated they have multiple prescriptions sent but she's asking for it to be sent to [facility] which that is her only preferred pharmacy so she doesn't know where [facility] is coming from. She wants someone to call and explain what's going on asap. Callback number [phone]

## 2024-01-18 MED ORDER — PROPRANOLOL HCL 20 MG PO TABS: 20.0000 mg | ORAL_TABLET | Freq: Two times a day (BID) | ORAL | 3 refills | Status: AC

## 2024-01-18 MED ORDER — FLECAINIDE ACETATE 50 MG PO TABS: 50.0000 mg | ORAL_TABLET | Freq: Two times a day (BID) | ORAL | 3 refills | Status: AC

## 2024-01-23 ENCOUNTER — Other Ambulatory Visit: Payer: Self-pay | Admitting: Internal Medicine

## 2024-01-23 DIAGNOSIS — K219 Gastro-esophageal reflux disease without esophagitis: Secondary | ICD-10-CM

## 2024-01-25 ENCOUNTER — Other Ambulatory Visit: Payer: Self-pay | Admitting: Internal Medicine

## 2024-01-25 ENCOUNTER — Telehealth: Payer: Self-pay

## 2024-01-25 NOTE — Telephone Encounter (Signed)
 Spoke with [facility]. Gave ok to fill.

## 2024-01-25 NOTE — Telephone Encounter (Signed)
 [facility] mail order pharmacy requesting clarification to dispense medication propranolol . Pharmacy is stating that pt is allergic to metoprolol  and has been prescribed propranolol . Does prescriber want to prescribe medication to pt? Please address Ph# [phone]   Inv# [phone]

## 2024-01-28 ENCOUNTER — Other Ambulatory Visit: Payer: Self-pay | Admitting: *Deleted

## 2024-01-28 MED ORDER — WEGOVY 1 MG/0.5ML ~~LOC~~ SOAJ: 1.0000 mg | SUBCUTANEOUS | 0 refills | Status: DC

## 2024-02-18 ENCOUNTER — Other Ambulatory Visit: Payer: Self-pay | Admitting: Internal Medicine

## 2024-02-18 MED ORDER — WEGOVY 1 MG/0.5ML ~~LOC~~ SOAJ: 1.0000 mg | SUBCUTANEOUS | 0 refills | Status: AC

## 2024-03-10 ENCOUNTER — Other Ambulatory Visit

## 2024-06-20 ENCOUNTER — Ambulatory Visit: Admitting: Student
